# Patient Record
Sex: Female | Born: 1975 | Hispanic: Yes | Marital: Single | Smoking: Never smoker
Health system: Southern US, Community
[De-identification: ages and names within clinical notes are randomized; demographics above are authoritative.]

## PROBLEM LIST (undated history)

## (undated) ENCOUNTER — Inpatient Hospital Stay (HOSPITAL_COMMUNITY): Payer: Self-pay

## (undated) DIAGNOSIS — O139 Gestational [pregnancy-induced] hypertension without significant proteinuria, unspecified trimester: Secondary | ICD-10-CM

## (undated) DIAGNOSIS — E119 Type 2 diabetes mellitus without complications: Secondary | ICD-10-CM

## (undated) HISTORY — DX: Gestational (pregnancy-induced) hypertension without significant proteinuria, unspecified trimester: O13.9

## (undated) HISTORY — DX: Type 2 diabetes mellitus without complications: E11.9

## (undated) HISTORY — PX: CHOLECYSTECTOMY: SHX55

---

## 2008-03-26 LAB — SICKLE CELL SCREEN: Sickle Cell Screen: NORMAL

## 2008-03-26 LAB — HEMOGLOBINOPATHY EVALUATION: Hemoglobin, Elp: NORMAL

## 2008-03-26 LAB — OB RESULTS CONSOLE VARICELLA ZOSTER ANTIBODY, IGG: Varicella: IMMUNE

## 2008-03-27 ENCOUNTER — Ambulatory Visit (HOSPITAL_COMMUNITY): Admission: RE | Admit: 2008-03-27 | Discharge: 2008-03-27 | Payer: Self-pay | Admitting: Family Medicine

## 2008-04-17 ENCOUNTER — Inpatient Hospital Stay (HOSPITAL_COMMUNITY): Admission: AD | Admit: 2008-04-17 | Discharge: 2008-04-17 | Payer: Self-pay | Admitting: Family Medicine

## 2008-06-16 ENCOUNTER — Inpatient Hospital Stay (HOSPITAL_COMMUNITY): Admission: AD | Admit: 2008-06-16 | Discharge: 2008-06-16 | Payer: Self-pay | Admitting: Obstetrics & Gynecology

## 2008-06-27 ENCOUNTER — Ambulatory Visit: Payer: Self-pay | Admitting: Physician Assistant

## 2008-06-27 ENCOUNTER — Inpatient Hospital Stay (HOSPITAL_COMMUNITY): Admission: AD | Admit: 2008-06-27 | Discharge: 2008-06-28 | Payer: Self-pay | Admitting: Obstetrics & Gynecology

## 2008-06-27 ENCOUNTER — Ambulatory Visit: Payer: Self-pay | Admitting: Obstetrics & Gynecology

## 2009-06-03 ENCOUNTER — Emergency Department (HOSPITAL_COMMUNITY): Admission: EM | Admit: 2009-06-03 | Discharge: 2009-06-04 | Payer: Self-pay | Admitting: Emergency Medicine

## 2009-07-25 DIAGNOSIS — E119 Type 2 diabetes mellitus without complications: Secondary | ICD-10-CM | POA: Insufficient documentation

## 2009-11-23 ENCOUNTER — Emergency Department (HOSPITAL_COMMUNITY): Admission: EM | Admit: 2009-11-23 | Discharge: 2009-11-23 | Payer: Self-pay | Admitting: Emergency Medicine

## 2010-04-02 ENCOUNTER — Encounter (INDEPENDENT_AMBULATORY_CARE_PROVIDER_SITE_OTHER): Payer: Self-pay | Admitting: Nurse Practitioner

## 2010-04-09 ENCOUNTER — Ambulatory Visit: Payer: Self-pay | Admitting: Nurse Practitioner

## 2010-04-09 DIAGNOSIS — I10 Essential (primary) hypertension: Secondary | ICD-10-CM

## 2010-04-09 DIAGNOSIS — E669 Obesity, unspecified: Secondary | ICD-10-CM

## 2010-04-27 ENCOUNTER — Ambulatory Visit: Payer: Self-pay | Admitting: Nurse Practitioner

## 2010-05-06 ENCOUNTER — Ambulatory Visit: Payer: Self-pay | Admitting: Nurse Practitioner

## 2010-05-06 DIAGNOSIS — K089 Disorder of teeth and supporting structures, unspecified: Secondary | ICD-10-CM | POA: Insufficient documentation

## 2010-05-06 LAB — CONVERTED CEMR LAB
Bilirubin Urine: NEGATIVE
Blood Glucose, Fingerstick: 109
Blood in Urine, dipstick: NEGATIVE
Nitrite: NEGATIVE
Urobilinogen, UA: 0.2
WBC Urine, dipstick: NEGATIVE

## 2010-05-07 ENCOUNTER — Encounter (INDEPENDENT_AMBULATORY_CARE_PROVIDER_SITE_OTHER): Payer: Self-pay | Admitting: Nurse Practitioner

## 2010-05-07 LAB — CONVERTED CEMR LAB
ALT: 36 units/L — ABNORMAL HIGH (ref 0–35)
Albumin: 4.4 g/dL (ref 3.5–5.2)
Alkaline Phosphatase: 128 units/L — ABNORMAL HIGH (ref 39–117)
BUN: 9 mg/dL (ref 6–23)
CO2: 21 meq/L (ref 19–32)
Chloride: 102 meq/L (ref 96–112)
Cholesterol: 159 mg/dL (ref 0–200)
Creatinine, Ser: 0.53 mg/dL (ref 0.40–1.20)
Glucose, Bld: 100 mg/dL — ABNORMAL HIGH (ref 70–99)
HDL: 50 mg/dL (ref >39)
Potassium: 4.4 meq/L (ref 3.5–5.3)
Total Bilirubin: 0.9 mg/dL (ref 0.3–1.2)
Total CHOL/HDL Ratio: 3.2
Total Protein: 7.4 g/dL (ref 6.0–8.3)
Triglycerides: 192 mg/dL — ABNORMAL HIGH (ref <150)
VLDL: 38 mg/dL (ref 0–40)

## 2010-05-10 ENCOUNTER — Encounter (INDEPENDENT_AMBULATORY_CARE_PROVIDER_SITE_OTHER): Payer: Self-pay | Admitting: Nurse Practitioner

## 2010-05-10 LAB — CONVERTED CEMR LAB
Bilirubin, Direct: 0.1 mg/dL (ref 0.0–0.3)
Indirect Bilirubin: 0.8 mg/dL (ref 0.0–0.9)

## 2010-05-12 ENCOUNTER — Encounter (INDEPENDENT_AMBULATORY_CARE_PROVIDER_SITE_OTHER): Payer: Self-pay | Admitting: Nurse Practitioner

## 2010-05-14 ENCOUNTER — Telehealth (INDEPENDENT_AMBULATORY_CARE_PROVIDER_SITE_OTHER): Payer: Self-pay | Admitting: Nurse Practitioner

## 2010-05-27 ENCOUNTER — Encounter (INDEPENDENT_AMBULATORY_CARE_PROVIDER_SITE_OTHER): Payer: Self-pay | Admitting: Nurse Practitioner

## 2010-06-14 ENCOUNTER — Encounter (INDEPENDENT_AMBULATORY_CARE_PROVIDER_SITE_OTHER): Payer: Self-pay | Admitting: Nurse Practitioner

## 2010-07-06 ENCOUNTER — Encounter (INDEPENDENT_AMBULATORY_CARE_PROVIDER_SITE_OTHER): Payer: Self-pay | Admitting: Nurse Practitioner

## 2010-07-06 ENCOUNTER — Ambulatory Visit: Payer: Self-pay | Admitting: Nurse Practitioner

## 2010-07-06 DIAGNOSIS — B373 Candidiasis of vulva and vagina: Secondary | ICD-10-CM | POA: Insufficient documentation

## 2010-07-06 DIAGNOSIS — B3731 Acute candidiasis of vulva and vagina: Secondary | ICD-10-CM | POA: Insufficient documentation

## 2010-07-06 LAB — CONVERTED CEMR LAB
Blood Glucose, Fingerstick: 118
Glucose, Urine, Semiquant: NEGATIVE
Ketones, urine, test strip: NEGATIVE
Urobilinogen, UA: 0.2
pH: 6

## 2010-07-07 ENCOUNTER — Encounter (INDEPENDENT_AMBULATORY_CARE_PROVIDER_SITE_OTHER): Payer: Self-pay | Admitting: Nurse Practitioner

## 2010-07-25 DIAGNOSIS — E119 Type 2 diabetes mellitus without complications: Secondary | ICD-10-CM

## 2010-07-25 HISTORY — DX: Type 2 diabetes mellitus without complications: E11.9

## 2010-08-17 ENCOUNTER — Ambulatory Visit
Admission: RE | Admit: 2010-08-17 | Discharge: 2010-08-17 | Payer: Self-pay | Source: Home / Self Care | Attending: Nurse Practitioner | Admitting: Nurse Practitioner

## 2010-08-17 ENCOUNTER — Encounter (INDEPENDENT_AMBULATORY_CARE_PROVIDER_SITE_OTHER): Payer: Self-pay | Admitting: Nurse Practitioner

## 2010-08-17 LAB — CONVERTED CEMR LAB
Nitrite: NEGATIVE
pH: 5

## 2010-08-18 ENCOUNTER — Ambulatory Visit
Admission: RE | Admit: 2010-08-18 | Discharge: 2010-08-18 | Payer: Self-pay | Source: Home / Self Care | Attending: Nurse Practitioner | Admitting: Nurse Practitioner

## 2010-08-24 NOTE — Letter (Signed)
Summary: PT INFORMATION SHEET  PT INFORMATION SHEET   Imported By: Arta Bruce 04/13/2010 15:17:54  _____________________________________________________________________  External Attachment:    Type:   Image     Comment:   External Document

## 2010-08-24 NOTE — Letter (Signed)
Summary: DENTAL REFERRAL  DENTAL REFERRAL   Imported By: Arta Bruce 05/07/2010 14:15:28  _____________________________________________________________________  External Attachment:    Type:   Image     Comment:   External Document

## 2010-08-24 NOTE — Assessment & Plan Note (Signed)
Summary: Diabetes   Vital Signs:  Patient profile:   35 year old female Height:      59 inches Weight:      183 pounds BMI:     37.10 Temp:     98.3 degrees F oral Pulse rate:   63 / minute Pulse rhythm:   regular Resp:     18 per minute BP sitting:   120 / 90  (right arm) Cuff size:   regular  Vitals Entered By: Armenia Shannon (May 06, 2010 11:19 AM)  Nutrition Counseling: Patient's BMI is greater than 25 and therefore counseled on weight management options. CC: four week f/u..., Hypertension Management Is Patient Diabetic? Yes Pain Assessment Patient in pain? no      CBG Result 109  Does patient need assistance? Functional Status Self care Ambulation Normal   CC:  four week f/u... and Hypertension Management.  History of Present Illness:  Pt into the office for diabetes. Pt established care on 04/07/2010.  Community Liason into the office for spanish interpreter  Diabetes Management History:      The patient is a 34 years old female who comes in for evaluation of DM Type 2.  She is (or has been) enrolled in the "Diabetic Education Program".  She states understanding of dietary principles and is following her diet appropriately.  No sensory loss is reported.  Self foot exams are not being performed.  She is not checking home blood sugars.  She says that she is exercising.  Type of exercise includes: walking.  Duration of exercise is estimated to be 60 min.  She is doing this 5 times per week.    Hypertension History:      She denies headache, chest pain, and palpitations.  She notes no problems with any antihypertensive medication side effects.  Pt is taking lisinopril daily as ordered.        Positive major cardiovascular risk factors include diabetes and hypertension.  Negative major cardiovascular risk factors include female age less than 73 years old and non-tobacco-user status.     Habits & Providers  Alcohol-Tobacco-Diet     Alcohol drinks/day: 0  Tobacco Status: never  Exercise-Depression-Behavior     Does Patient Exercise: yes     Exercise Counseling: to improve exercise regimen     Type of exercise: walking     Exercise (avg: min/session): 60 min.     Times/week: 5     Depression Counseling: not indicated; screening negative for depression     Drug Use: never  Allergies: No Known Drug Allergies  Review of Systems General:  bil molar pain. CV:  Denies chest pain or discomfort. Resp:  Denies cough. GI:  Denies abdominal pain, nausea, and vomiting.  Diabetic Foot Exam Foot Inspection Is there a history of a foot ulcer?              No Is there a foot ulcer now?              No Can the patient see the bottom of their feet?          No Are the shoes appropriate in style and fit?          Yes Is there swelling or an abnormal foot shape?          No Are the toenails long?                No Are the toenails thick?  No Are the toenails ingrown?              No Is there heavy callous build-up?              No Is there pain in the calf muscle (Intermittent claudication) when walking?    NoIs there a claw toe deformity?              No Is there elevated skin temperature?            No Is there limited ankle dorsiflexion?            No Is there foot or ankle muscle weakness?            No  Diabetic Foot Care Education Pulse Check          Right Foot          Left Foot Dorsalis Pedis:        normal            normal   Physical Exam  General:  alert.   Head:  normocephalic.   Lungs:  normal breath sounds.   Heart:  normal rate and regular rhythm.   Abdomen:  normal bowel sounds.   Neurologic:  alert & oriented X3.    Diabetes Management Exam:       Nails:          Left foot: normal          Right foot: normal   Impression & Recommendations:  Problem # 1:  DIABETES MELLITUS, TYPE II (ICD-250.00) will call [place] pharmacy to see why pt was not given a glucometer - she need to check BS daily before  breakfast Her updated medication list for this problem includes:    Metformin Hcl 500 Mg Tabs (Metformin hcl) ..... One tablet by mouth two times a day for diabetes    Lisinopril 5 Mg Tabs (Lisinopril) ..... One tablet by mouth daily for blood pressure  Orders: T-Lipid Profile ([address]109-60454) T-Comprehensive Metabolic Panel (09811-91478) Capillary Blood Glucose/CBG (29562)  Problem # 2:  HYPERTENSION, BENIGN ESSENTIAL (ICD-401.1) BP is stable DASH diet Her updated medication list for this problem includes:    Lisinopril 5 Mg Tabs (Lisinopril) ..... One tablet by mouth daily for blood pressure  Problem # 3:  OBESITY (ICD-278.00) advised pt to add exercise to her diet  Problem # 4:  DENTAL PAIN (ICD-525.9) will refer to dental clinic handout given about free dental clinic Orders: Dental Referral (Dentist)  Complete Medication List: 1)  Metformin Hcl 500 Mg Tabs (Metformin hcl) .... One tablet by mouth two times a day for diabetes 2)  Lisinopril 5 Mg Tabs (Lisinopril) .... One tablet by mouth daily for blood pressure 3)  Blood Glucose Meter Kit (Blood glucose monitoring suppl) .... Use to check blood sugar once daily before breakfast 4)  Lancets Misc (Lancets) .... Use to check blood sugar daily before breakfast 5)  Blood Glucose Test Strp (Glucose blood) .... Use to check blood sugar before breakfast  Other Orders: Flu Vaccine 46yrs + (13086) Admin 1st Vaccine (57846) Admin 1st Vaccine [facility]) [phone])  Diabetes Management Assessment/Plan:      Her blood pressure goal is < 130/80.    Hypertension Assessment/Plan:      The patient's hypertensive risk group is category C: Target organ damage and/or diabetes.  Today's blood pressure is 120/90.  Her blood pressure goal is < 130/80.  Patient Instructions: 1)  You have received the  flu vaccine today. 2)  You will be referred to the dental clinic. 3)  You can also go to the free dental clinic. 4)  For pain in teeth you can take  ibuprofen as needed (with food) 5)  Return to office if any swelling of gums, tenderness, fever. 6)  Follow up in 2 months with n.martin,fnp for diabetes. 7)  will need cbg, hgba1c, u/a. Prescriptions: LISINOPRIL 5 MG TABS (LISINOPRIL) One tablet by mouth daily for blood pressure  #30 x 5   Entered and Authorized by:   Lehman Prom FNP   Signed by:   Lehman Prom FNP on 05/06/2010   Method used:   Faxed to ...       [facility] (retail)       [address] [place], [place]  [address]       Ph: [phone]       Fax: [phone]   RxID:   [id number] METFORMIN HCL 500 MG TABS (METFORMIN HCL) One tablet by mouth two times a day for diabetes  #60 x 5   Entered and Authorized by:   Lehman Prom FNP   Signed by:   Lehman Prom FNP on 05/06/2010   Method used:   Faxed to ...       [facility] (retail)       [address] [place], [place]  [address]       Ph: [phone]       Fax: [phone]   RxID:   [id number]          Diabetic Foot Exam Last Podiatry Exam Date: 05/06/2010  Foot Inspection Is there a history of a foot ulcer?              No Is there a foot ulcer now?              No Is there swelling or an abnormal foot shape?          No Are the toenails long?                No Are the toenails thick?                No Are the toenails ingrown?              No Is there heavy callous build-up?              No Is there a claw toe deformity?                          No Is there elevated skin temperature?            No Is there limited ankle dorsiflexion?            No Is there foot or ankle muscle weakness?            No Do you have pain in calf while walking?           No      Pulse Check          Right Foot          Left Foot Dorsalis Pedis:        normal            normal    Prevention & Chronic Care Immunizations  Influenza vaccine: Fluvax 3+   (05/06/2010)    Tetanus booster: Not documented    Pneumococcal vaccine: Pneumovax  (04/09/2010)  Other Screening   Pap smear: Not documented   Smoking status: never  (05/06/2010)  Diabetes Mellitus   HgbA1C: 9.4  (04/09/2010)    Eye exam: Not documented    Foot exam: yes  (04/09/2010)   High risk foot: Not documented   Foot care education: Done  (04/09/2010)    Urine microalbumin/creatinine ratio: Not documented  Lipids   Total Cholesterol: Not documented   LDL: Not documented   LDL Direct: Not documented   HDL: Not documented   Triglycerides: Not documented  Hypertension   Last Blood Pressure: 120 / 90  (05/06/2010)   Serum creatinine: Not documented   Serum potassium Not documented CMP ordered   Self-Management Support :    Diabetes self-management support: Not documented    Hypertension self-management support: Not documented   Nursing Instructions: Give Flu vaccine today    Laboratory Results   Urine Tests    Routine Urinalysis   Color: yellow Glucose: negative   (Normal Range: Negative) Bilirubin: negative   (Normal Range: Negative) Ketone: negative   (Normal Range: Negative) Blood: negative   (Normal Range: Negative) Protein: negative   (Normal Range: Negative) Urobilinogen: 0.2   (Normal Range: 0-1) Nitrite: negative   (Normal Range: Negative) Leukocyte Esterace: negative   (Normal Range: Negative)     Blood Tests     CBG Random:: 109mg /dL       Influenza Vaccine    Vaccine Type: Fluvax 3+    Site: right deltoid    Mfr: GlaxoSmithKline    Dose: 0.5 ml    Route: IM    Given by: Armenia Shannon    Exp. Date: 12/2010    Lot #: [id number]    VIS given: 02/16/10 version given May 06, 2010.  Flu Vaccine Consent Questions    Do you have a history of severe allergic reactions to this vaccine? no    Any prior history of allergic reactions to egg and/or gelatin? no    Do you have a sensitivity to the preservative Thimersol?  no    Do you have a past history of Guillan-Barre Syndrome? no    Do you currently have an acute febrile illness? no    Have you ever had a severe reaction to latex? no    Vaccine information given and explained to patient? yes    Are you currently pregnant? no

## 2010-08-24 NOTE — Progress Notes (Signed)
Summary: needs lab visit  Phone Note Call from Patient   Summary of Call: [facility] lab said there was not enough specimen for hep b surface antibody Initial call taken by: Armenia Shannon,  May 14, 2010 9:12 AM  Follow-up for Phone Call        forward to Jesse Fall, fnp Follow-up by: Levon Hedger,  May 14, 2010 9:34 AM  Additional Follow-up for Phone Call Additional follow up Details #1::        return for redraw. notify pt that her liver enzymes were elevated during a recent office visit.  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 notified and lab appt. scheduled. Follow-up by: Gaylyn Cheers RN,  May 19, 2010 12:02 PM

## 2010-08-24 NOTE — Letter (Signed)
Summary: NUTRITION /SUSIE  NUTRITION /SUSIE   Imported By: Arta Bruce 05/13/2010 11:10:43  _____________________________________________________________________  External Attachment:    Type:   Image     Comment:   External Document

## 2010-08-24 NOTE — Assessment & Plan Note (Signed)
Summary: NEW - Establish Care   Vital Signs:  Patient profile:   35 year old female LMP:     03/25/2010 Height:      59 inches Weight:      183.8 pounds BMI:     37.26 Temp:     97.0 degrees F oral Pulse rate:   64 / minute Pulse rhythm:   regular Resp:     16 per minute BP sitting:   130 / 90  (left arm) Cuff size:   regular  Vitals Entered By: Levon Hedger (April 09, 2010 9:20 AM)  Nutrition Counseling: Patient's BMI is greater than 25 and therefore counseled on weight management options. CC: new pt ...newly diagnosed diabetic was seen at [facility], Hypertension Management Is Patient Diabetic? Yes Pain Assessment Patient in pain? no       Does patient need assistance? Functional Status Self care Ambulation Normal LMP (date): 03/25/2010     Enter LMP: 03/25/2010   CC:  new pt ...newly diagnosed diabetic was seen at [facility] and Hypertension Management.  History of Present Illness:  Pt into the office to establish care. No previous PCP Multiple previous ER visits, one earlier this year she was dx with diabetes.  Obesity - lost 32 pounds in 2011 since dx with diabetes.  Spanish speaking - language line in use.Spanish Speaking  Diabetes Management History:      The patient is a 35 years old female who comes in for evaluation of DM Type 2.  She has not been enrolled in the "Diabetic Education Program".  She states lack of understanding of dietary principles and is not following her diet appropriately.  No sensory loss is reported.  Self foot exams are not being performed.  She is not checking home blood sugars.  She says that she is exercising.  Type of exercise includes: walking.  Duration of exercise is estimated to be 60 min.  She is doing this 5 times per week.        Hypoglycemic symptoms are not occurring.  No hyperglycemic symptoms are reported.    Hypertension History:      She denies headache, chest pain, and palpitations.  She notes no problems  with any antihypertensive medication side effects.        Positive major cardiovascular risk factors include diabetes and hypertension.  Negative major cardiovascular risk factors include female age less than 54 years old and non-tobacco-user status.     Habits & Providers  Alcohol-Tobacco-Diet     Alcohol drinks/day: 0     Tobacco Status: never  Exercise-Depression-Behavior     Does Patient Exercise: yes     Exercise Counseling: to improve exercise regimen     Type of exercise: walking     Exercise (avg: min/session): 60 min.     Times/week: 5     Have you felt down or hopeless? no     Have you felt little pleasure in things? no     Depression Counseling: not indicated; screening negative for depression     Drug Use: never  Medications Prior to Update: 1)  None  Allergies (verified): No Known Drug Allergies  Past History:  Past Surgical History: Cholecystectomy 2007  Diabetic Foot Exam Foot Inspection Is there a history of a foot ulcer?              No Is there a foot ulcer now?              No  Can the patient see the bottom of their feet?          No Are the shoes appropriate in style and fit?          No Is there swelling or an abnormal foot shape?          No Are the toenails long?                No Are the toenails thick?                No Is there heavy callous build-up?              No Is there pain in the calf muscle (Intermittent claudication) when walking?    NoIs there a claw toe deformity?              No Is there elevated skin temperature?            No Is there limited ankle dorsiflexion?            No Is there foot or ankle muscle weakness?            No  Diabetic Foot Care Education Patient educated on appropriate care of diabetic feet.  Pulse Check          Right Foot          Left Foot Dorsalis Pedis:        normal            normal    10-g (5.07) Semmes-Weinstein Monofilament Test Performed by: Lehman Prom FNP          Right Foot           Left Foot Visual Inspection               Test Control      normal         normal Site 1         normal         normal Site 2         normal         normal Site 3         normal         normal Site 4         normal         normal Site 5         normal         normal Site 6         normal         normal Site 7         normal         normal Site 8         normal         normal Site 9         normal         normal Site 10         normal         normal  Impression      normal         normal  Legend:  Site 1 = Plantar aspect of first toe (center of pad) Site 2 = Plantar aspect of third toe (center of pad) Site 3 = Plantar aspect of fifth toe (center of pad) Site 4 = Plantar aspect of first metatarsal head Site 5 = Plantar aspect of third metatarsal  head Site 6 = Plantar aspect of fifth metatarsal head Site 7 = Plantar aspect of medial midfoot Site 8 = Plantar aspect of lateral midfoot Site 9 = Plantar aspect of heel Site 10 = dorsal aspect of foot between the base of the first and second toes   Result is Abnormal if patient was unable to perceive the monofilament at site indicated.   Family History: Mother - diabetes father - healthy  Social History: Spanish speaking 4 children - natural deliveries Tobacco - none ETOH - none Drug - noneSmoking Status:  never Drug Use:  never Does Patient Exercise:  yes  Review of Systems General:  Denies loss of appetite. CV:  Denies chest pain or discomfort. Resp:  Denies cough. GI:  Denies abdominal pain, nausea, and vomiting. Endo:  Denies excessive thirst and excessive urination.  Physical Exam  General:  alert.  obese Head:  normocephalic.   Lungs:  normal breath sounds.   Heart:  normal rate and regular rhythm.   Abdomen:  normal bowel sounds.   Msk:  normal ROM.   Neurologic:  alert & oriented X3.   Skin:  Tattoos on bilateral wrists  Diabetes Management Exam:    Foot Exam (with socks and/or shoes not present):        Sensory-Monofilament:          Left foot: normal          Right foot: normal       Inspection:          Left foot: normal          Right foot: normal       Nails:          Left foot: normal          Right foot: normal   Impression & Recommendations:  Problem # 1:  DIABETES MELLITUS, TYPE II (ICD-250.00) uncontrolled. new dx pt will needed education Orders: UA Dipstick w/o Micro (manual) (78295) Capillary Blood Glucose/CBG (82948) Hemoglobin A1C (83036) T-Urine Microalbumin w/creat. ratio [phone]) Diabetic Clinic Referral (Diabetic)  Her updated medication list for this problem includes:    Metformin Hcl 500 Mg Tabs (Metformin hcl) ..... One tablet by mouth two times a day for diabetes    Lisinopril 5 Mg Tabs (Lisinopril) ..... One tablet by mouth daily for blood pressure  Problem # 2:  HYPERTENSION, BENIGN ESSENTIAL (ICD-401.1) slightly elevated today. will start ACE Her updated medication list for this problem includes:    Lisinopril 5 Mg Tabs (Lisinopril) ..... One tablet by mouth daily for blood pressure  Problem # 3:  OBESITY (ICD-278.00) pt is still trying to lose weight. continue efforts  Complete Medication List: 1)  Metformin Hcl 500 Mg Tabs (Metformin hcl) .... One tablet by mouth two times a day for diabetes 2)  Lisinopril 5 Mg Tabs (Lisinopril) .... One tablet by mouth daily for blood pressure 3)  Blood Glucose Meter Kit (Blood glucose monitoring suppl) .... Use to check blood sugar once daily before breakfast 4)  Lancets Misc (Lancets) .... Use to check blood sugar daily before breakfast 5)  Blood Glucose Test Strp (Glucose blood) .... Use to check blood sugar before breakfast  Other Orders: Pneumococcal Vaccine (29528) Admin 1st Vaccine (41324) Admin 1st Vaccine [facility]) [phone])  Diabetes Management Assessment/Plan:      Her blood pressure goal is < 130/80.    Hypertension Assessment/Plan:      The patient's hypertensive risk group is  category C: Target organ damage  and/or diabetes.  Today's blood pressure is 130/90.  Her blood pressure goal is < 130/80.  Patient Instructions: 1)  Schedule an appointment with Drucilla Schmidt - diabetes educator for diabetes teaching.  **Will need Arna Medici to assist with interpreting** 2)  You have recieved the pneumovax today.   3)  You can get your refills from the [facility].  Bring your meter with you to the appointment with Susie Piper. 4)  Diabetes - your Hgba1c = 9.4.  The goal is less than 7. 5)  Start metformin 500mg  by mouth daily with breakfast and dinner. 6)  Bllood pressure  - slightly high.  will start low dose medication - lisinopril 5mg  by mouth daily.   7)  Schedule follow up appointment with n.martin in 4 weeks for diabetes. will need cbg, lipids, cmp, flu vaccine 8)  Do not eat after midnight before this visit. Prescriptions: BLOOD GLUCOSE TEST  STRP (GLUCOSE BLOOD) Use to check blood sugar before breakfast  #100 x 5   Entered and Authorized by:   Lehman Prom FNP   Signed by:   Lehman Prom FNP on 04/09/2010   Method used:   Print then Give to Patient   RxID:   [id number] LANCETS  MISC (LANCETS) Use to check blood sugar daily before breakfast  #100 x 5   Entered and Authorized by:   Lehman Prom FNP   Signed by:   Lehman Prom FNP on 04/09/2010   Method used:   Print then Give to Patient   RxID:   [id number] BLOOD GLUCOSE METER  KIT (BLOOD GLUCOSE MONITORING SUPPL) use to check blood sugar once daily before breakfast  #1 meter x 0   Entered and Authorized by:   Lehman Prom FNP   Signed by:   Lehman Prom FNP on 04/09/2010   Method used:   Print then Give to Patient   RxID:   [id number] LISINOPRIL 5 MG TABS (LISINOPRIL) One tablet by mouth daily for blood pressure  #30 x 3   Entered and Authorized by:   Lehman Prom FNP   Signed by:   Lehman Prom FNP on 04/09/2010   Method used:   Print then Give to Patient   RxID:    [id number] METFORMIN HCL 500 MG TABS (METFORMIN HCL) One tablet by mouth two times a day for diabetes  #60 x 3   Entered and Authorized by:   Lehman Prom FNP   Signed by:   Lehman Prom FNP on 04/09/2010   Method used:   Print then Give to Patient   RxID:   [id number]   Laboratory Results   Urine Tests  Date/Time Received: April 09, 2010 10:12 AM   Routine Urinalysis   Color: lt. yellow Appearance: Clear Glucose: negative   (Normal Range: Negative) Bilirubin: negative   (Normal Range: Negative) Ketone: negative   (Normal Range: Negative) Spec. Gravity: <1.005   (Normal Range: 1.003-1.035) Blood: negative   (Normal Range: Negative) pH: 5.0   (Normal Range: 5.0-8.0) Protein: negative   (Normal Range: Negative) Urobilinogen: 0.2   (Normal Range: 0-1) Nitrite: negative   (Normal Range: Negative) Leukocyte Esterace: negative   (Normal Range: Negative)     Blood Tests   Date/Time Received: April 09, 2010 9:43 AM   Glucose (random): 129 mg/dL   (Normal Range: 51-884) HGBA1C: 9.4%   (Normal Range: Non-Diabetic - 3-6%   Control Diabetic - 6-8%)     Laboratory Results   Urine Tests    Routine Urinalysis  Color: lt. yellow Appearance: Clear Glucose: negative   (Normal Range: Negative) Bilirubin: negative   (Normal Range: Negative) Ketone: negative   (Normal Range: Negative) Spec. Gravity: <1.005   (Normal Range: 1.003-1.035) Blood: negative   (Normal Range: Negative) pH: 5.0   (Normal Range: 5.0-8.0) Protein: negative   (Normal Range: Negative) Urobilinogen: 0.2   (Normal Range: 0-1) Nitrite: negative   (Normal Range: Negative) Leukocyte Esterace: negative   (Normal Range: Negative)     Blood Tests     Glucose (random): 129 mg/dL   (Normal Range: 13-086) HGBA1C: 9.4%   (Normal Range: Non-Diabetic - 3-6%   Control Diabetic - 6-8%)       Pneumovax Vaccine    Vaccine Type: Pneumovax    Site: right deltoid    Mfr: [employer]ck     Dose: 0.5 ml    Route: IM    Given by: Levon Hedger    Exp. Date: 08/15/2011    Lot #: [id number]    VIS given: 06/29/09 version given April 09, 2010.    ndc  [id number]

## 2010-08-26 NOTE — Assessment & Plan Note (Signed)
Summary: Diabetes   Vital Signs:  Patient profile:   35 year old female LMP:     06/20/2010 Weight:      182.5 pounds BMI:     36.99 Temp:     97.0 degrees F oral Pulse rate:   60 / minute Pulse rhythm:   regular Resp:     16 per minute BP sitting:   126 / 90  (left arm) Cuff size:   regular  Vitals Entered By: Levon Hedger (July 06, 2010 9:37 AM)  Nutrition Counseling: Patient's BMI is greater than 25 and therefore counseled on weight management options. CC: follow-up visit DM.Marland KitchenMarland Kitchenpt has been out of medication for 1 month..., Hypertension Management Is Patient Diabetic? Yes Pain Assessment Patient in pain? no      CBG Result 118 CBG Device ID [id number]  Does patient need assistance? Functional Status Self care Ambulation Normal LMP (date): 06/20/2010     Enter LMP: 06/20/2010   CC:  follow-up visit DM.Marland KitchenMarland Kitchenpt has been out of medication for 1 month... and Hypertension Management.  History of Present Illness:  Pt into the office for diabetes. She has been without the medications for 1 month. Rx written in 04/2010 with 5 refills. Pt does not understand refill protocal.  Obesity - down 1 pound since last visit  Family planning - pt is using condoms at this time but she would like another method. previously using depoprovera but she stopped taking it due to weight gain.  Diabetes Management History:      The patient is a 35 years old female who comes in for evaluation of DM Type 2.  She is (or has been) enrolled in the "Diabetic Education Program".  She states understanding of dietary principles and is following her diet appropriately.  No sensory loss is reported.  Self foot exams are not being performed.  She is not checking home blood sugars.  She says that she is exercising.  Type of exercise includes: walking.  Duration of exercise is estimated to be 60 min.  She is doing this 5 times per week.        Hypoglycemic symptoms are not occurring.  No hyperglycemic symptoms  are reported.  Other comments include: pt does not have a glucometer. Pt has been without her meds for the past month.        The following changes have been made to her treatment plan since last visit: medication changes.  Treatment plan changes were initiated by patient.    Hypertension History:      She denies headache, chest pain, and palpitations.  She notes no problems with any antihypertensive medication side effects.  pt has not taken her meds for the past month due to not understanding the refill process.        Positive major cardiovascular risk factors include diabetes and hypertension.  Negative major cardiovascular risk factors include female age less than 3 years old and non-tobacco-user status.     Habits & Providers  Alcohol-Tobacco-Diet     Alcohol drinks/day: 0     Tobacco Status: never  Exercise-Depression-Behavior     Does Patient Exercise: yes     Exercise Counseling: to improve exercise regimen     Type of exercise: walking     Exercise (avg: min/session): 60 min.     Times/week: 5     Depression Counseling: not indicated; screening negative for depression     Drug Use: never  Allergies (verified): No Known Drug Allergies  Review  of Systems General:  Complains of fatigue. Eyes:  Complains of blurring; for the past 3 days. CV:  Denies chest pain or discomfort. Resp:  Denies cough. GI:  Denies abdominal pain, diarrhea, nausea, and vomiting. GU:  Complains of dysuria; denies hematuria; +itching. Endo:  Denies excessive thirst and excessive urination.  Physical Exam  General:  alert.   Head:  normocephalic.   Lungs:  normal breath sounds.   Heart:  normal rate and regular rhythm.   Abdomen:  normal bowel sounds.   Msk:  up to the exam table Neurologic:  alert & oriented X3.   Skin:  color normal.   Psych:  Oriented X3.    Diabetes Management Exam:    Foot Exam (with socks and/or shoes not present):       Sensory-Monofilament:          Left foot:  normal          Right foot: normal   Impression & Recommendations:  Problem # 1:  DIABETES MELLITUS, TYPE II (ICD-250.00) pt was instructed to get refils as ordered she also needs to get a glucometer to check blood sugar daily Her updated medication list for this problem includes:    Metformin Hcl 500 Mg Tabs (Metformin hcl) ..... One tablet by mouth two times a day for diabetes    Lisinopril 5 Mg Tabs (Lisinopril) ..... One tablet by mouth daily for blood pressure  Orders: Capillary Blood Glucose/CBG (82948) T- Hemoglobin A1C (51884-16606)  Problem # 2:  HYPERTENSION, BENIGN ESSENTIAL (ICD-401.1)  Her updated medication list for this problem includes:    Lisinopril 5 Mg Tabs (Lisinopril) ..... One tablet by mouth daily for blood pressure  Problem # 3:  OBESITY (ICD-278.00) down 1 pounds since last visit pt to keep up efforts to lose weight  Problem # 4:  CANDIDIASIS OF VULVA AND VAGINA (ICD-112.1)  Her updated medication list for this problem includes:    Fluconazole 150 Mg Tabs (Fluconazole) ..... One tablet by mouth x 1 dose  Orders: KOH/ WET Mount [phone]) UA Dipstick w/o Micro (manual) (10932)  Complete Medication List: 1)  Metformin Hcl 500 Mg Tabs (Metformin hcl) .... One tablet by mouth two times a day for diabetes 2)  Lisinopril 5 Mg Tabs (Lisinopril) .... One tablet by mouth daily for blood pressure 3)  Blood Glucose Meter Kit (Blood glucose monitoring suppl) .... Use to check blood sugar once daily before breakfast 4)  Lancets Misc (Lancets) .... Use to check blood sugar daily before breakfast 5)  Blood Glucose Test Strp (Glucose blood) .... Use to check blood sugar before breakfast 6)  Fluconazole 150 Mg Tabs (Fluconazole) .... One tablet by mouth x 1 dose  Diabetes Management Assessment/Plan:      Her blood pressure goal is < 130/80.    Hypertension Assessment/Plan:      The patient's hypertensive risk group is category C: Target organ damage and/or  diabetes.  Her calculated 10 year risk of coronary heart disease is 2 %.  Today's blood pressure is 126/90.  Her blood pressure goal is < 130/80.  Patient Instructions: 1)  Schedule a visit in the next 2 weeks for family planning to discuss birth control. 2)  Use condoms until your next visit. 3)  You have a yeast infection probably due to blood sugar.  4)  Take fluconazole 150mg  by mouth x 1 dose 5)  Schedule a complete physical exam in 3 months. 6)  You will need PAP, mammogram, u/a,  tdap. 7)  will need cbc, tsh, cmp. Prescriptions: FLUCONAZOLE 150 MG TABS (FLUCONAZOLE) One tablet by mouth x 1 dose  #1 x 0   Entered and Authorized by:   Lehman Prom FNP   Signed by:   Lehman Prom FNP on 07/06/2010   Method used:   Print then Give to Patient   RxID:   [id number]    Orders Added: 1)  Capillary Blood Glucose/CBG [82948] 2)  Est. Patient Level III [53664] 3)  T- Hemoglobin A1C [83036-23375] 4)  KOH/ WET Mount [87210] 5)  UA Dipstick w/o Micro (manual) [81002]    Last LDL:                                                 71 (05/06/2010 9:46:00 PM)        Diabetic Foot Exam Last Podiatry Exam Date: 05/06/2010    10-g (5.07) Semmes-Weinstein Monofilament Test Performed by: Levon Hedger          Right Foot          Left Foot Visual Inspection               Test Control      normal         normal Site 1         normal         normal Site 2         normal         normal Site 3         normal         normal Site 4         normal         normal Site 5         normal         normal Site 6         normal         normal Site 7         normal         normal Site 8         normal         normal Site 9         normal         normal Site 10         normal         normal  Impression      normal         normal   Laboratory Results   Urine Tests  Date/Time Received: July 06, 2010 9:54 AM   Routine Urinalysis   Color: lt. yellow Appearance:  Clear Glucose: negative   (Normal Range: Negative) Bilirubin: negative   (Normal Range: Negative) Ketone: negative   (Normal Range: Negative) Spec. Gravity: 1.015   (Normal Range: 1.003-1.035) Blood: negative   (Normal Range: Negative) pH: 6.0   (Normal Range: 5.0-8.0) Protein: negative   (Normal Range: Negative) Urobilinogen: 0.2   (Normal Range: 0-1) Nitrite: negative   (Normal Range: Negative) Leukocyte Esterace: negative   (Normal Range: Negative)     Blood Tests     CBG Random:: 118  Date/Time Received: July 06, 2010 11:02 AM   Wet Mount/KOH Source: vaginal WBC/hpf: 1-5 Bacteria/hpf: rare Clue cells/hpf: none Yeast/hpf: moderate Trichomonas/hpf: none   Laboratory Results  Urine Tests    Routine Urinalysis   Color: lt. yellow Appearance: Clear Glucose: negative   (Normal Range: Negative) Bilirubin: negative   (Normal Range: Negative) Ketone: negative   (Normal Range: Negative) Spec. Gravity: 1.015   (Normal Range: 1.003-1.035) Blood: negative   (Normal Range: Negative) pH: 6.0   (Normal Range: 5.0-8.0) Protein: negative   (Normal Range: Negative) Urobilinogen: 0.2   (Normal Range: 0-1) Nitrite: negative   (Normal Range: Negative) Leukocyte Esterace: negative   (Normal Range: Negative)     Blood Tests     CBG Random:: 118mg /dL    DIRECTV KOH: Negative

## 2010-08-26 NOTE — Letter (Signed)
Summary: *HSN Results Follow up  [facility]  [address] [place], [place] [address]   Phone: [phone]  Fax: [phone]      07/07/2010   Rhonda Waller [address] [place], [place]  [address]   Dear  Rhonda Waller,                            ____S.Drinkard,FNP   ____D. Gore,FNP       ____B. McPherson,MD   ____V. Rankins,MD    ____E. Mulberry,MD    __X__N. Daphine Deutscher, FNP  ____D. Reche Dixon, MD    ____K. Philipp Deputy, MD    ____Other     This letter is to inform you that your recent test(s):  _______Pap Smear    __X_____Lab Test     _______X-ray    ___X____ is within acceptable limits  _______ requires a medication change  _______ requires a follow-up lab visit  _______ requires a follow-up visit with your provider   Comments: Your Hgba1c = 6.2 Your blood sugar is doing great. Keep up the good work. Continue your current medications along with diet and exercise.       _________________________________________________________ If you have any questions, please contact our office [phone].                    Sincerely,    Lehman Prom FNP [facility]

## 2010-08-26 NOTE — Letter (Signed)
Summary: IT consultant Handout:  - Administrator

## 2010-08-26 NOTE — Assessment & Plan Note (Signed)
Summary: DEPO INJECTION PER FNP MARTIN  / NS  Nurse Visit   Allergies: No Known Drug Allergies  Medication Administration  Injection # 1:    Medication: Depo-Provera 150mg     Diagnosis: FAMILY PLANNING (ICD-V25.09)    Route: IM    Site: L deltoid    Exp Date: 06/23/2013    Lot #: [id number]    Mfr: Francisca December    Comments: NDC#:[id number]    Patient tolerated injection without complications    Given by: Hale Drone CMA (August 18, 2010 2:48 PM)  Orders Added: 1)  Est. Patient Nurse visit [09003] 2)  Admin of Therapeutic Inj  intramuscular or subcutaneous [96372] 3)  Depo-Provera 150mg  [J1055]

## 2010-08-26 NOTE — Assessment & Plan Note (Signed)
Summary: Family Planning   Vital Signs:  Patient profile:   35 year old female Menstrual status:  regular LMP:     07/20/2010 Weight:      178.9 pounds BMI:     36.26 Temp:     97.4 degrees F oral Pulse rate:   64 / minute Pulse rhythm:   regular Resp:     16 per minute BP sitting:   120 / 90  (left arm) Cuff size:   regular  Vitals Entered By: Levon Hedger (August 17, 2010 10:34 AM)  Nutrition Counseling: Patient's BMI is greater than 25 and therefore counseled on weight management options. CC: discuss birth control Is Patient Diabetic? Yes Pain Assessment Patient in pain? no      CBG Result 105 CBG Device ID A  Does patient need assistance? Functional Status Self care Ambulation Normal LMP (date): 07/20/2010 LMP - Character: normal     Menstrual Status regular Enter LMP: 07/20/2010   CC:  discuss birth control.  History of Present Illness:  Pt into the office for family planning. Pt currently has 4 children Pt does not want to have any more EVER but only wants to have short term not permanent control. Pt is not married. Pt has previously been on DepoProvera with the last over 1 year ago. She only stopped getting the injection because she did not keep her f/u appt at [facility]. +weight gain from DepoProvera with some loss of extra weight after she stopped getting the injections  Obsity - down 4 pounds since last visit  Last PAP Smear done 2 years ago.  Habits & Providers  Alcohol-Tobacco-Diet     Alcohol drinks/day: 0     Tobacco Status: never  Exercise-Depression-Behavior     Does Patient Exercise: yes     Exercise Counseling: to improve exercise regimen     Type of exercise: walking     Exercise (avg: min/session): 60 min.     Times/week: 5     Depression Counseling: not indicated; screening negative for depression     Drug Use: never  Allergies (verified): No Known Drug Allergies  Review of Systems General:  Denies fever. CV:  Denies  chest pain or discomfort. Resp:  Denies cough. GI:  Denies abdominal pain, nausea, and vomiting.  Physical Exam  General:  alert.   Head:  normocephalic.   Lungs:  normal breath sounds.   Heart:  normal rate and regular rhythm.   Abdomen:  normal bowel sounds.   Msk:  up to the exam table Neurologic:  alert & oriented X3.   Skin:  color normal.   Psych:  Oriented X3.    Diabetes Management Exam:    Foot Exam (with socks and/or shoes not present):       Sensory-Monofilament:          Left foot: normal          Right foot: normal   Impression & Recommendations:  Problem # 1:  FAMILY PLANNING (ICD-V25.09) will check labs today pt to return tomorrow for depoprovera Orders: Urine Pregnancy Test  (16109) T-Pregnancy (Serum), Qual.  (60454-[address]11)  Problem # 2:  DIABETES MELLITUS, TYPE II (ICD-250.00) conitinue current meds Her updated medication list for this problem includes:    Metformin Hcl 500 Mg Tabs (Metformin hcl) ..... One tablet by mouth two times a day for diabetes    Lisinopril 5 Mg Tabs (Lisinopril) ..... One tablet by mouth daily for blood pressure  Orders: Capillary Blood Glucose/CBG (  16109) UA Dipstick w/o Micro (manual) (60454)  Problem # 3:  OBESITY (ICD-278.00) down 4 pounds since last visit advised pt to keep up the good work  Complete Medication List: 1)  Metformin Hcl 500 Mg Tabs (Metformin hcl) .... One tablet by mouth two times a day for diabetes 2)  Lisinopril 5 Mg Tabs (Lisinopril) .... One tablet by mouth daily for blood pressure 3)  Blood Glucose Meter Kit (Blood glucose monitoring suppl) .... Use to check blood sugar once daily before breakfast 4)  Lancets Misc (Lancets) .... Use to check blood sugar daily before breakfast 5)  Blood Glucose Test Strp (Glucose blood) .... Use to check blood sugar before breakfast 6)  Depo-provera 150 Mg/ml Susp (Medroxyprogesterone acetate) .... One injection im every 12 weeks for family planning  Patient  Instructions: 1)  Your labs will be checked today. 2)  Return tomorrow for DepoProvera injection - go get [facility] from [facility] in the morning. 3)  Remember that since you are getting the injection BEFORE your period then you may have some breakthrough bleeding or spotting over the next 1-2 months. 4)  Schedule an appointment for a complete physical exam 5)  You will need PAP, PHQ-9 Prescriptions: DEPO-PROVERA 150 MG/ML SUSP (MEDROXYPROGESTERONE ACETATE) One injection IM every 12 weeks for family planning  #1 x 1   Entered and Authorized by:   Lehman Prom FNP   Signed by:   Lehman Prom FNP on 08/17/2010   Method used:   Faxed to ...       [facility] (retail)       [address] [place], [place]  [address]       Ph: [phone]       Fax: [phone]   RxID:   [id number]    Orders Added: 1)  Capillary Blood Glucose/CBG [82948] 2)  UA Dipstick w/o Micro (manual) [81002] 3)  Urine Pregnancy Test  [81025] 4)  Est. Patient Level III [27253] 5)  T-Pregnancy (Serum), Qual.  [phone]    Laboratory Results   Urine Tests  Date/Time Received: August 17, 2010 10:55 AM   Routine Urinalysis   Glucose: negative   (Normal Range: Negative) Bilirubin: negative   (Normal Range: Negative) Ketone: negative   (Normal Range: Negative) Spec. Gravity: <1.005   (Normal Range: 1.003-1.035) Blood: negative   (Normal Range: Negative) pH: 5.0   (Normal Range: 5.0-8.0) Protein: negative   (Normal Range: Negative) Urobilinogen: 0.2   (Normal Range: 0-1) Nitrite: negative   (Normal Range: Negative) Leukocyte Esterace: trace   (Normal Range: Negative)    Urine HCG: negative  Blood Tests     CBG Random:: 105      Last LDL:                                                 71 (05/06/2010 9:46:00 PM)        Diabetic Foot Exam Last Podiatry Exam Date: 05/06/2010    10-g (5.07) Semmes-Weinstein Monofilament  Test Performed by: Levon Hedger          Right Foot          Left Foot Visual Inspection               Test Control      normal  normal Site 1         normal         normal Site 2         normal         normal Site 3         normal         normal Site 4         normal         normal Site 5         normal         normal Site 6         normal         normal Site 7         normal         normal Site 8         normal         normal Site 9         normal         normal Site 10         normal         normal  Impression      normal         normal

## 2010-10-05 ENCOUNTER — Other Ambulatory Visit: Payer: Self-pay | Admitting: Nurse Practitioner

## 2010-10-05 ENCOUNTER — Encounter: Payer: Self-pay | Admitting: Nurse Practitioner

## 2010-10-05 ENCOUNTER — Encounter (INDEPENDENT_AMBULATORY_CARE_PROVIDER_SITE_OTHER): Payer: Self-pay | Admitting: Nurse Practitioner

## 2010-10-05 DIAGNOSIS — M25569 Pain in unspecified knee: Secondary | ICD-10-CM | POA: Insufficient documentation

## 2010-10-05 LAB — CONVERTED CEMR LAB
AST: 29 units/L (ref 0–37)
Basophils Absolute: 0 10*3/uL (ref 0.0–0.1)
Basophils Relative: 0 % (ref 0–1)
Bilirubin Urine: NEGATIVE
CO2: 22 meq/L (ref 19–32)
Chlamydia, DNA Probe: NEGATIVE
Creatinine, Ser: 0.66 mg/dL (ref 0.40–1.20)
Glucose, Urine, Semiquant: NEGATIVE
HCT: 40.9 % (ref 36.0–46.0)
Hemoglobin: 13.4 g/dL (ref 12.0–15.0)
KOH Prep: NEGATIVE
Ketones, urine, test strip: NEGATIVE
Lymphs Abs: 2 10*3/uL (ref 0.7–4.0)
MCHC: 32.8 g/dL (ref 30.0–36.0)
Nitrite: NEGATIVE
Platelets: 219 10*3/uL (ref 150–400)
Potassium: 4.5 meq/L (ref 3.5–5.3)
Protein, U semiquant: NEGATIVE
RBC: 4.58 M/uL (ref 3.87–5.11)
RDW: 14.7 % (ref 11.5–15.5)
Sodium: 140 meq/L (ref 135–145)
Specific Gravity, Urine: <1.005
Urobilinogen, UA: 0.2
WBC Urine, dipstick: NEGATIVE
WBC: 7.3 10*3/uL (ref 4.0–10.5)

## 2010-10-06 ENCOUNTER — Encounter (INDEPENDENT_AMBULATORY_CARE_PROVIDER_SITE_OTHER): Payer: Self-pay | Admitting: Nurse Practitioner

## 2010-10-07 ENCOUNTER — Encounter (INDEPENDENT_AMBULATORY_CARE_PROVIDER_SITE_OTHER): Payer: Self-pay | Admitting: Nurse Practitioner

## 2010-10-12 LAB — COMPREHENSIVE METABOLIC PANEL
ALT: 67 U/L — ABNORMAL HIGH (ref 0–35)
Alkaline Phosphatase: 167 U/L — ABNORMAL HIGH (ref 39–117)
CO2: 27 mEq/L (ref 19–32)
Chloride: 99 mEq/L (ref 96–112)
Creatinine, Ser: 0.51 mg/dL (ref 0.4–1.2)
GFR calc Af Amer: >60 mL/min (ref >60)
GFR calc non Af Amer: >60 mL/min (ref >60)
Glucose, Bld: 376 mg/dL — ABNORMAL HIGH (ref 70–99)
Potassium: 4.1 mEq/L (ref 3.5–5.1)
Sodium: 135 mEq/L (ref 135–145)
Total Protein: 7.7 g/dL (ref 6.0–8.3)

## 2010-10-12 LAB — CBC
Hemoglobin: 14.8 g/dL (ref 12.0–15.0)
MCHC: 33.8 g/dL (ref 30.0–36.0)
MCV: 90.6 fL (ref 78.0–100.0)
Platelets: 196 10*3/uL (ref 150–400)
RDW: 12.9 % (ref 11.5–15.5)

## 2010-10-12 LAB — DIFFERENTIAL
Basophils Absolute: 0 10*3/uL (ref 0.0–0.1)
Eosinophils Relative: 1 % (ref 0–5)
Monocytes Absolute: 0.3 10*3/uL (ref 0.1–1.0)
Monocytes Relative: 4 % (ref 3–12)

## 2010-10-12 LAB — GLUCOSE, CAPILLARY
Glucose-Capillary: 277 mg/dL — ABNORMAL HIGH (ref 70–99)
Glucose-Capillary: 295 mg/dL — ABNORMAL HIGH (ref 70–99)

## 2010-10-12 LAB — URINALYSIS, ROUTINE W REFLEX MICROSCOPIC
Bilirubin Urine: NEGATIVE
Leukocytes, UA: NEGATIVE
Urobilinogen, UA: 0.2 mg/dL (ref 0.0–1.0)
pH: 6.5 (ref 5.0–8.0)

## 2010-10-12 LAB — WET PREP, GENITAL: Trich, Wet Prep: NONE SEEN

## 2010-10-12 LAB — GC/CHLAMYDIA PROBE AMP, GENITAL: GC Probe Amp, Genital: NEGATIVE

## 2010-10-12 LAB — KETONES, QUALITATIVE: Acetone, Bld: NEGATIVE

## 2010-10-12 NOTE — Assessment & Plan Note (Signed)
Summary: Complete Physical Exam   Vital Signs:  Patient profile:   35 year old female Menstrual status:  irregular LMP:     09/08/2010 Weight:      181.1 pounds BMI:     36.71 Temp:     98.6 degrees F oral Pulse rate:   72 / minute Pulse rhythm:   regular Resp:     16 per minute BP sitting:   110 / 90  (left arm) Cuff size:   regular  Vitals Entered By: Levon Hedger (October 05, 2010 10:24 AM)  Nutrition Counseling: Patient's BMI is greater than 25 and therefore counseled on weight management options. CC: CPP, Hypertension Management Is Patient Diabetic? No Pain Assessment Patient in pain? no       Does patient need assistance? Functional Status Self care Ambulation Normal  Vision Screening:Left eye w/o correction: 20 / 20+1 Right Eye w/o correction: 20 / 20-! Both eyes w/o correction:  20/ 20+4        Vision Entered By: Levon Hedger (October 05, 2010 10:25 AM) LMP (date): 09/08/2010 LMP - Character: normal     Menstrual Status irregular Enter LMP: 09/08/2010   CC:  CPP and Hypertension Management.  History of Present Illness:  Pt into the office for CPE  PAP - last done 3 years ago; normal Menses - monthly but she received her first depoprovera injection on 08/18/2010.  Menses started 09/08/2010 but none since.  Mammogram - never had mammogram  no family history of breast cancer  Optho - no glasses or contacts.  no recent eye exam  Dental - No recent dental appt. She does still have some pain in her right upper.  Dental referral ordered in 04/2010.  Community liason present today during exam - Spanish  Diabetes Management History:      The patient is a 34 years old female who comes in for evaluation of DM Type 2.  She is (or has been) enrolled in the "Diabetic Education Program".  She states lack of understanding of dietary principles and is not following her diet appropriately.  No sensory loss is reported.  Self foot exams are not being  performed.  She is not checking home blood sugars.  She says that she is exercising.  Type of exercise includes: walking.  Duration of exercise is estimated to be 60 min.  She is doing this 5 times per week.        Hypoglycemic symptoms are not occurring.  No hyperglycemic symptoms are reported.        No changes have been made to her treatment plan since last visit.    Hypertension History:      She denies headache, chest pain, and palpitations.  She notes no problems with any antihypertensive medication side effects.  Pt is taking meds as ordered.        Positive major cardiovascular risk factors include diabetes and hypertension.  Negative major cardiovascular risk factors include female age less than 28 years old, no history of hyperlipidemia, and non-tobacco-user status.        Further assessment for target organ damage reveals no history of ASHD, cardiac end-organ damage (CHF/LVH), stroke/TIA, peripheral vascular disease, renal insufficiency, or hypertensive retinopathy.     Habits & Providers  Alcohol-Tobacco-Diet     Alcohol drinks/day: 0     Tobacco Status: never  Exercise-Depression-Behavior     Does Patient Exercise: yes     Exercise Counseling: to improve exercise regimen  Type of exercise: walking     Exercise (avg: min/session): 60 min.     Times/week: 5     Depression Counseling: not indicated; screening negative for depression     Drug Use: never  Comments: doing zumba twice per week PHQ-9 score = 10  Allergies (verified): No Known Drug Allergies  Review of Systems General:  Denies fever; +dental pain (pt has an outstanding dental referral). Eyes:  Denies blurring. ENT:  Denies earache. CV:  Denies chest pain or discomfort. Resp:  Denies cough. GI:  Denies abdominal pain, nausea, and vomiting. GU:  Denies dysuria. MS:  Complains of joint pain; bil knees R>L. Derm:  Denies dryness. Neuro:  Denies headaches. Psych:  Denies depression.  Physical  Exam  General:  alert.   Head:  normocephalic.   Eyes:  pupils round.   Ears:  moderate cerumen bilaterally Nose:  no nasal discharge.   Mouth:  poor dentition.   Neck:  supple.   Chest Wall:  no mass.   Breasts:  no abnormal thickening.   Lungs:  normal breath sounds.   Heart:  normal rate and regular rhythm.   Abdomen:  soft, non-tender, and normal bowel sounds.   Pulses:  R radial normal and L radial normal.   Extremities:  no edema Neurologic:  alert & oriented X3.   Skin:  color normal.    Diabetes Management Exam:       Nails:          Left foot: normal          Right foot: normal   Knee Exam  General:    obese.    Knee Exam:    Right:    Inspection:  Abnormal    Palpation:  Abnormal       Location:  patellar    Stability:  stable    Tenderness:  no    Swelling:  no    Erythema:  no    Left:    Inspection:  Abnormal    Palpation:  Abnormal       Location:  patellar    Stability:  stable    Tenderness:  no    Swelling:  no    Erythema:  no   Impression & Recommendations:  Problem # 1:  ROUTINE GYNECOLOGICAL EXAMINATION (ICD-V72.31)  PAP done rec optho and dental exam PHQ-9 score = 10  Orders: T- GC Chlamydia (59563)  Problem # 2:  FAMILY PLANNING (ICD-V25.09) pt to keep on depoprovera  Problem # 3:  HYPERTENSION, BENIGN ESSENTIAL (ICD-401.1)  Her updated medication list for this problem includes:    Lisinopril 5 Mg Tabs (Lisinopril) ..... One tablet by mouth daily for blood pressure  Problem # 4:  DIABETES MELLITUS, TYPE II (ICD-250.00)  Her updated medication list for this problem includes:    Metformin Hcl 500 Mg Tabs (Metformin hcl) ..... One tablet by mouth two times a day for diabetes    Lisinopril 5 Mg Tabs (Lisinopril) ..... One tablet by mouth daily for blood pressure  Problem # 5:  OBESITY (ICD-278.00) advised pt to stop zumba and to start walking  Problem # 6:  KNEE PAIN, BILATERAL (ICD-719.46)  Her updated medication list  for this problem includes:    Ibuprofen 800 Mg Tabs (Ibuprofen) ..... One tablet by mouth two times a day as needed for knee pain  Complete Medication List: 1)  Metformin Hcl 500 Mg Tabs (Metformin hcl) .... One tablet by mouth two times a  day for diabetes 2)  Lisinopril 5 Mg Tabs (Lisinopril) .... One tablet by mouth daily for blood pressure 3)  Blood Glucose Meter Kit (Blood glucose monitoring suppl) .... Use to check blood sugar once daily before breakfast 4)  Lancets Misc (Lancets) .... Use to check blood sugar daily before breakfast 5)  Blood Glucose Test Strp (Glucose blood) .... Use to check blood sugar before breakfast 6)  Depo-provera 150 Mg/ml Susp (Medroxyprogesterone acetate) .... One injection im every 12 weeks for family planning 7)  Ibuprofen 800 Mg Tabs (Ibuprofen) .... One tablet by mouth two times a day as needed for knee pain  Diabetes Management Assessment/Plan:      Her blood pressure goal is < 130/80.    Hypertension Assessment/Plan:      The patient's hypertensive risk group is category C: Target organ damage and/or diabetes.  Her calculated 10 year risk of coronary heart disease is 2 %.  Today's blood pressure is 110/90.  Her blood pressure goal is < 130/80.  Patient Instructions: 1)  Schedule an appointment for retasure exam to check your eyes. 2)  Your labs will be checked today and you will be notified of the results 3)  Knee pain - most likely due to zumba.  reduce zumba to only once per week and the other days walk for 15-20 minutes up to 30 minutes daily.  may take ibuprofen 800mg  by mouth two times a day as needed for pain.  be sure eat food before you take this med. 4)  Follow up with provider in 3 months for diabetes.   5)  Will need u/a, Hgba1c, cbg. 6)  You will need a tetanus on next visit Prescriptions: IBUPROFEN 800 MG TABS (IBUPROFEN) One tablet by mouth two times a day as needed for knee pain  #30 x 0   Entered and Authorized by:   Lehman Prom  FNP   Signed by:   Lehman Prom FNP on 10/05/2010   Method used:   Faxed to ...       [facility] - [facility] (retail)       [address] [place], [place]  [address]       Ph: [phone]       Fax: [phone]   RxID:   [id number] LANCETS  MISC (LANCETS) Use to check blood sugar daily before breakfast  #100 x 11   Entered and Authorized by:   Lehman Prom FNP   Signed by:   Lehman Prom FNP on 10/05/2010   Method used:   Faxed to ...       [facility] - [facility] (retail)       [address] [place], [place]  [address]       Ph: [phone]       Fax: [phone]   RxID:   [id number] BLOOD GLUCOSE TEST  STRP (GLUCOSE BLOOD) Use to check blood sugar before breakfast  #100 x 11   Entered and Authorized by:   Lehman Prom FNP   Signed by:   Lehman Prom FNP on 10/05/2010   Method used:   Faxed to ...       [facility] - [facility] (retail)       [address] [place], [place]  [address]       Ph: [phone]       Fax: [phone]  [phone]   RxID:   [id number] DEPO-PROVERA 150 MG/ML SUSP (MEDROXYPROGESTERONE ACETATE) One injection IM every 12 weeks for family planning  #1 x 3   Entered and Authorized by:   Lehman Prom FNP   Signed by:   Lehman Prom FNP on 10/05/2010   Method used:   Faxed to ...       [facility] - [facility] (retail)       [address] [place], [place]  [address]       Ph: [phone]       Fax: [phone]   RxID:   [id number] LISINOPRIL 5 MG TABS (LISINOPRIL) One tablet by mouth daily for blood pressure  #30 x 11   Entered and Authorized by:   Lehman Prom FNP   Signed by:   Lehman Prom FNP on 10/05/2010   Method used:   Faxed to ...       [facility] - [facility] (retail)       [address] [place], [place]  [address]       Ph:  [phone]       Fax: [phone]   RxID:   [id number] METFORMIN HCL 500 MG TABS (METFORMIN HCL) One tablet by mouth two times a day for diabetes  #60 x 11   Entered and Authorized by:   Lehman Prom FNP   Signed by:   Lehman Prom FNP on 10/05/2010   Method used:   Faxed to ...       [facility] - [facility] (retail)       [address] [place], [place]  [address]       Ph: [phone]       Fax: [phone]   RxID:   [id number]    Orders Added: 1)  Est. Patient age 10-39 [99395] 2)  T- GC Chlamydia [69678]    Laboratory Results   Urine Tests  Date/Time Received: October 05, 2010 10:35 AM   Routine Urinalysis   Color: lt. yellow Appearance: Clear Glucose: negative   (Normal Range: Negative) Bilirubin: negative   (Normal Range: Negative) Ketone: negative   (Normal Range: Negative) Spec. Gravity: <1.005   (Normal Range: 1.003-1.035) Blood: negative   (Normal Range: Negative) pH: 5.5   (Normal Range: 5.0-8.0) Protein: negative   (Normal Range: Negative) Urobilinogen: 0.2   (Normal Range: 0-1) Nitrite: negative   (Normal Range: Negative) Leukocyte Esterace: negative   (Normal Range: Negative)    Date/Time Received: October 05, 2010 2:05 PM   [place]et [place]/KOH Source: vaginal WBC/hpf: 1-5 Bacteria/hpf: rare Clue cells/hpf: none Yeast/hpf: none Trichomonas/hpf: none  Other Tests  Rapid HIV: negative   Prevention & Chronic Care Immunizations   Influenza vaccine: Fluvax 3+  (05/06/2010)    Tetanus booster: Not documented   Td booster deferral: Not available  (10/05/2010)    Pneumococcal vaccine: Pneumovax  (04/09/2010)  Other Screening   Pap smear: Not documented   Smoking status: never  (10/05/2010)  Diabetes Mellitus   HgbA1C: 6.2  (07/06/2010)    Eye exam: Not documented    Foot exam: yes  (08/17/2010)   High risk foot: Not documented   Foot care education: Done  (04/09/2010)    Urine  microalbumin/creatinine ratio: Not documented  Lipids   Total Cholesterol: 159  (05/06/2010)   LDL: 71  (05/06/2010)   LDL Direct: Not documented  HDL: 50  (05/06/2010)   Triglycerides: 192  (05/06/2010)  Hypertension   Last Blood Pressure: 110 / 90  (10/05/2010)   Serum creatinine: 0.53  (05/06/2010)   Serum potassium 4.4  (05/06/2010)  Self-Management Support :    Diabetes self-management support: Not documented    Hypertension self-management support: Not documented   Nursing Instructions: Give tetanus booster today     Laboratory Results   Urine Tests    Routine Urinalysis   Color: lt. yellow Appearance: Clear Glucose: negative   (Normal Range: Negative) Bilirubin: negative   (Normal Range: Negative) Ketone: negative   (Normal Range: Negative) Spec. Gravity: <1.005   (Normal Range: 1.003-1.035) Blood: negative   (Normal Range: Negative) pH: 5.5   (Normal Range: 5.0-8.0) Protein: negative   (Normal Range: Negative) Urobilinogen: 0.2   (Normal Range: 0-1) Nitrite: negative   (Normal Range: Negative) Leukocyte Esterace: negative   (Normal Range: Negative)      Wet Mount Wet Mount KOH: Negative  Other Tests  Rapid HIV: negative

## 2010-10-12 NOTE — Progress Notes (Signed)
Summary: Office Visit//health screen  Office Visit//health screen   Imported By: Arta Bruce 10/05/2010 16:24:24  _____________________________________________________________________  External Attachment:    Type:   Image     Comment:   External Document

## 2010-10-12 NOTE — Letter (Signed)
Summary: *HSN Results Follow up  [facility]  [address] [place], [place] [address]   Phone: [phone]  Fax: [phone]      10/06/2010   Rhonda Waller [address] [place], [place]  [address]   Dear  Ms. Rhonda Waller,                            ____S.Drinkard,FNP   ____D. Gore,FNP       ____B. McPherson,MD   ____V. Rankins,MD    ____E. Mulberry,MD    __X__N. Daphine Deutscher, FNP  ____D. Reche Dixon, MD    ____K. Philipp Deputy, MD    ____Other     This letter is to inform you that your recent test(s):  ____X___Pap Smear    ___X____Lab Test     _______X-ray    ____X___ is within acceptable limits  _______ requires a medication change  _______ requires a follow-up lab visit  _______ requires a follow-up visit with your provider   Comments: Labs done during recent office visit are normal.    Pap Smear results ___________________.       _________________________________________________________ If you have any questions, please contact our office [phone].                    Sincerely,    Rhonda Prom FNP [facility]

## 2010-10-12 NOTE — Miscellaneous (Signed)
Summary: Labs for CPE visit  Clinical Lists Changes  Orders: Added new Test order of T-Comprehensive Metabolic Panel [id number]) - Signed Added new Test order of T-CBC w/Diff [id number]) - Signed Added new Service order of Rapid HIV  ([id number]) - Signed Added new Test order of T-Syphilis Test (RPR) [phone]) - Signed Added new Test order of T-TSH [id number]) - Signed Added new Test order of T- Hemoglobin A1C ([id number]) - Signed Added new Test order of T-Urine Microalbumin w/creat. ratio [phone]) - Signed Added new Service order of UA Dipstick w/o Micro (manual) ([id number]) - Signed Observations: Added new observation of CHIEF CMPLNT: CPP (10/05/2010 10:08) Added new observation of BMI: 36.71  (10/05/2010 10:08) Added new observation of BP DIASTOLIC: 90 mmHg (10/05/2010 51:88) Added new observation of BP SYSTOLIC: 110 mmHg (10/05/2010 10:08) Added new observation of RESP RATE: 16 /min (10/05/2010 10:08) Added new observation of PULSE RATE: 70 /min (10/05/2010 10:08) Added new observation of TEMPERATURE: 98.6 deg F (10/05/2010 10:08) Added new observation of WEIGHT: 181.1 lb (10/05/2010 10:08) Added new observation of TEMP SITE: oral  (10/05/2010 10:08) Added new observation of PULSE RHYTHM: regular  (10/05/2010 10:08) Added new observation of BP CUFF SIZE: regular  (10/05/2010 10:08)      Vital Signs:  Patient profile:   35 year old female Menstrual status:  regular Weight:      181.1 pounds BMI:     36.71 Temp:     98.6 degrees F oral Pulse rate:   70 / minute Pulse rhythm:   regular Resp:     16 per minute BP sitting:   110 / 90  (left arm) Cuff size:   regular  Vitals Entered By: Levon Hedger (October 05, 2010 10:16 AM)  CC: CPP Is Patient Diabetic? No Pain Assessment Patient in pain? no       Does patient need assistance? Functional Status Self care Ambulation Normal

## 2010-10-27 LAB — POCT CARDIAC MARKERS
Myoglobin, poc: 46.4 ng/mL (ref 12–200)
Troponin i, poc: <0.05 ng/mL (ref 0.00–0.09)

## 2010-10-27 LAB — CBC
MCV: 88.2 fL (ref 78.0–100.0)
WBC: 9.6 10*3/uL (ref 4.0–10.5)

## 2010-10-27 LAB — BASIC METABOLIC PANEL
BUN: 4 mg/dL — ABNORMAL LOW (ref 6–23)
Calcium: 9.3 mg/dL (ref 8.4–10.5)
Creatinine, Ser: 0.54 mg/dL (ref 0.4–1.2)
GFR calc non Af Amer: >60 mL/min (ref >60)

## 2010-10-27 LAB — DIFFERENTIAL
Eosinophils Absolute: 0.2 10*3/uL (ref 0.0–0.7)
Lymphocytes Relative: 27 % (ref 12–46)
Lymphs Abs: 2.5 10*3/uL (ref 0.7–4.0)
Neutro Abs: 6.4 10*3/uL (ref 1.7–7.7)
Neutrophils Relative %: 67 % (ref 43–77)

## 2010-10-27 LAB — D-DIMER, QUANTITATIVE: D-Dimer, Quant: <0.22 ug/mL-FEU (ref 0.00–0.48)

## 2011-04-25 LAB — URINALYSIS, ROUTINE W REFLEX MICROSCOPIC
Bilirubin Urine: NEGATIVE
Ketones, ur: NEGATIVE
Nitrite: NEGATIVE
pH: 5.5

## 2011-04-26 LAB — CBC
HCT: 31.9 — ABNORMAL LOW
Hemoglobin: 10.6 — ABNORMAL LOW
MCHC: 33.3
RDW: 16.5 — ABNORMAL HIGH

## 2011-04-26 LAB — COMPREHENSIVE METABOLIC PANEL
BUN: 7
Calcium: 8.8
Glucose, Bld: 82
Sodium: 135
Total Protein: 6

## 2011-04-26 LAB — URINALYSIS, ROUTINE W REFLEX MICROSCOPIC
Nitrite: NEGATIVE
Specific Gravity, Urine: <1.005 — ABNORMAL LOW
pH: 5.5

## 2011-04-26 LAB — URIC ACID: Uric Acid, Serum: 4.7

## 2011-04-26 LAB — URINE MICROSCOPIC-ADD ON

## 2011-04-29 LAB — COMPREHENSIVE METABOLIC PANEL
Albumin: 2.7 g/dL — ABNORMAL LOW (ref 3.5–5.2)
Alkaline Phosphatase: 215 U/L — ABNORMAL HIGH (ref 39–117)
BUN: 9 mg/dL (ref 6–23)
Calcium: 8.7 mg/dL (ref 8.4–10.5)
Potassium: 4.4 mEq/L (ref 3.5–5.1)
Sodium: 134 mEq/L — ABNORMAL LOW (ref 135–145)
Total Protein: 6.5 g/dL (ref 6.0–8.3)

## 2011-04-29 LAB — CBC
HCT: 35.4 % — ABNORMAL LOW (ref 36.0–46.0)
Hemoglobin: 10.2 g/dL — ABNORMAL LOW (ref 12.0–15.0)
MCHC: 33.5 g/dL (ref 30.0–36.0)
Platelets: 195 10*3/uL (ref 150–400)
RBC: 3.59 MIL/uL — ABNORMAL LOW (ref 3.87–5.11)
RDW: 16.8 % — ABNORMAL HIGH (ref 11.5–15.5)
WBC: 9.6 10*3/uL (ref 4.0–10.5)

## 2011-04-29 LAB — RPR: RPR Ser Ql: NONREACTIVE

## 2012-07-25 NOTE — L&D Delivery Note (Signed)
Delivery Note At 4:29 AM a viable female was delivered via Vaginal, Spontaneous Delivery (Presentation: left; Occiput Anterior).  APGAR: 5, 9; weight pending.   Placenta status: Intact, Spontaneous.  Cord: 3 vessels.  Cord pH: not drawn  Anesthesia: Epidural  Episiotomy: None Lacerations: None Suture Repair: n/a Est. Blood Loss (mL): 500 cc  Mom to postpartum.  Baby to Couplet care / Skin to Skin.  Called to pt room with mother stating she felt ready to push. Pt had a thick anterior lip that was easily reducible. Pt pushed through about 3 more contractions, with approximately 20 second shoulder dystocia after delivery of the head (released with McRobert's, suprapubic pressure, and release of anterior cervical lip behind the baby's shoulder), with subsequent delivery of viable stunned female over intact perineum. Cord clamped and cut and baby taken to warmer, with APGARs as above; baby recovered quickly with stimulation, not requiring any supplemental O2. Baby placed skin to skin afterward.  Dr. Reola Calkins was present for the delivery in its entirety and assisted with the delivery including release of the shoulder dystocia as detailed above.  Bobbye Morton, MD PGY-2, [facility] 06/18/2013, 4:48 AM  I have seen and examined this patient and agree with above documentation in the resident's note. Pt pushed well and delivery complicated by approx 20 sec shoulder dystocia due to a thick anterior lip. Maneuver's as above. Baby initially requiring stimulation but recovering quickly. Attempted to obtain cord pH but clamp released prior to collection. Apgars 5, 9. Baby to skin to skin and Mom to postpartum.   Rulon Abide, M.D. [facility] Fellow 06/18/2013 4:53 AM

## 2013-04-04 ENCOUNTER — Other Ambulatory Visit (HOSPITAL_COMMUNITY): Payer: Self-pay | Admitting: Physician Assistant

## 2013-04-04 DIAGNOSIS — Z0489 Encounter for examination and observation for other specified reasons: Secondary | ICD-10-CM

## 2013-04-05 ENCOUNTER — Other Ambulatory Visit (HOSPITAL_COMMUNITY): Payer: Self-pay | Admitting: Physician Assistant

## 2013-04-05 ENCOUNTER — Ambulatory Visit (HOSPITAL_COMMUNITY)
Admission: RE | Admit: 2013-04-05 | Discharge: 2013-04-05 | Disposition: A | Discharge status: Home / Self Care | Payer: Medicaid Other | Source: Ambulatory Visit | Attending: Physician Assistant | Admitting: Physician Assistant

## 2013-04-05 DIAGNOSIS — O24919 Unspecified diabetes mellitus in pregnancy, unspecified trimester: Secondary | ICD-10-CM | POA: Insufficient documentation

## 2013-04-05 DIAGNOSIS — O139 Gestational [pregnancy-induced] hypertension without significant proteinuria, unspecified trimester: Secondary | ICD-10-CM | POA: Insufficient documentation

## 2013-04-05 DIAGNOSIS — Z0489 Encounter for examination and observation for other specified reasons: Secondary | ICD-10-CM

## 2013-04-05 DIAGNOSIS — O358XX0 Maternal care for other (suspected) fetal abnormality and damage, not applicable or unspecified: Secondary | ICD-10-CM | POA: Insufficient documentation

## 2013-04-05 DIAGNOSIS — Z363 Encounter for antenatal screening for malformations: Secondary | ICD-10-CM | POA: Insufficient documentation

## 2013-04-05 DIAGNOSIS — Z1389 Encounter for screening for other disorder: Secondary | ICD-10-CM | POA: Insufficient documentation

## 2013-04-06 ENCOUNTER — Inpatient Hospital Stay (HOSPITAL_COMMUNITY): Admission: RE | Admit: 2013-04-06 | Payer: Self-pay | Source: Ambulatory Visit

## 2013-04-08 ENCOUNTER — Ambulatory Visit: Payer: Medicaid Other | Admitting: *Deleted

## 2013-04-08 ENCOUNTER — Encounter: Payer: Medicaid Other | Attending: Family Medicine | Admitting: *Deleted

## 2013-04-08 VITALS — Ht 59.0 in | Wt 213.7 lb

## 2013-04-08 DIAGNOSIS — E119 Type 2 diabetes mellitus without complications: Secondary | ICD-10-CM | POA: Insufficient documentation

## 2013-04-08 DIAGNOSIS — Z713 Dietary counseling and surveillance: Secondary | ICD-10-CM | POA: Insufficient documentation

## 2013-04-08 DIAGNOSIS — O24912 Unspecified diabetes mellitus in pregnancy, second trimester: Secondary | ICD-10-CM

## 2013-04-08 DIAGNOSIS — O24919 Unspecified diabetes mellitus in pregnancy, unspecified trimester: Secondary | ICD-10-CM | POA: Insufficient documentation

## 2013-04-08 NOTE — Progress Notes (Signed)
Nutrition note: 1st visit consult & DM diet education Pt has h/o obesity & stated she had DM prior to her pregnancy (~71yrs). Pt has gained 15.7# @ [redacted]w[redacted]d (due 07/24/13), which is > expected. Pt reports eating 3 meals/d & drinks water, 1c juice/d & soda occ. Pt reports having nausea occ but no vomiting or heartburn. NKFA. Pt is taking PNV.  Pt reports doing Zumba for 1hr 3x/wk.  Pt reports no knowledge of DM diet. Pt received verbal & written education in Spanish via interpreter, Trinna Post, about DM during pregancy diet. Encouraged pt to add a walk on days she does not do zumba. Discussed wt gain goals of 11-20# or 0.5#/wk. Pt agrees to follow DM diet with 3 meals & 3 snacks/d with proper CHO/ protein combination.  Pt has WIC & plans to BF. F/u in 2-4 wks Blondell Reveal, MS, RD, LDN

## 2013-04-08 NOTE — Progress Notes (Signed)
DIABETES: Through interpreter, patient notes that she has been diabetic for approximately 2 years. She was previously treated through [facility] and was on medication. Since [facility] closed she has had no healthcare. It has been approximately 8 months since she has had medication. I obtained a 1hpp reading of 289. Patient is approximately [redacted]wks pregnant. Patient met with Dietitian. I consulted with Dr. Debroah Loop and we will bring the patient back this Thursday for evaluation of diabetes management. Dispensed TrueTrack glucometer, strips and lancets. Instructed in glucometer use with teach back. Will test four times per day.

## 2013-04-09 ENCOUNTER — Encounter: Payer: Self-pay | Admitting: *Deleted

## 2013-04-11 ENCOUNTER — Encounter: Payer: Self-pay | Admitting: Obstetrics & Gynecology

## 2013-04-11 ENCOUNTER — Ambulatory Visit (INDEPENDENT_AMBULATORY_CARE_PROVIDER_SITE_OTHER): Payer: Medicaid Other | Admitting: Obstetrics & Gynecology

## 2013-04-11 VITALS — BP 123/85 | Temp 98.8°F | Wt 210.0 lb

## 2013-04-11 DIAGNOSIS — O24913 Unspecified diabetes mellitus in pregnancy, third trimester: Secondary | ICD-10-CM | POA: Insufficient documentation

## 2013-04-11 DIAGNOSIS — E119 Type 2 diabetes mellitus without complications: Secondary | ICD-10-CM

## 2013-04-11 DIAGNOSIS — O24919 Unspecified diabetes mellitus in pregnancy, unspecified trimester: Secondary | ICD-10-CM

## 2013-04-11 LAB — POCT URINALYSIS DIP (DEVICE)
Bilirubin Urine: NEGATIVE
Hgb urine dipstick: NEGATIVE
Ketones, ur: NEGATIVE mg/dL
Nitrite: POSITIVE — AB
Specific Gravity, Urine: 1.01 (ref 1.005–1.030)
pH: 6 (ref 5.0–8.0)

## 2013-04-11 NOTE — Patient Instructions (Signed)
Diabetes mellitus Tipo 1 o Tipo 2 durante el embarazo  (Type 1 or Type 2 Diabetes Mellitus During Pregnancy) La diabetes mellitus tipo 1, que en general se la llama simplemente diabetes, es una enfermedad prolongada (crnica). Ocurre cuando las clulas de los islotes pncreticos que producen la insulina (una hormona) se destruyen y no pueden producirla. En la diabetes gestacional, el pncreas no produce suficiente insulina (una hormona), las clulas son menos sensibles a la insulina que se produce (resistencia a la insulina), o ambos. La insulina es necesaria para movilizar los azcares de los alimentos a las clulas de los tejidos. Las clulas de los tejidos Cendant Corporationutilizan los azcares para Psychiatristobtener energa. La falta de insulina o la falta de una respuesta normal a la insulina hace que el exceso de azcar se acumule en la sangre en lugar de Customer service managerpenetrar en las clulas de los tejidos. Como resultado, se desarrollan niveles altos de azcar en la sangre (hiperglucemia).  Si se mantienen los niveles de Event organiserglucosa en sangre en un rango normal antes y Therapist, sportsdurante el [place]embarazo, las mujeres pueden tener un embarazo saludable. Si no controla eBaybien los niveles de glucosa en Garfieldsangre, puede haber riesgos para usted, para el beb que no ha nacido (feto), durante el Camptontrabajo de parto y Naalehuel parto, o en el beb recin nacido.  FACTORES DE RIESGO   Una persona est predispuesta a desarrollar diabetes tipo 1 si alguien en su familia padece la enfermedad y se expone a ciertos desencadenantes ambientales adicionales.  Tiene una mayor probabilidad de desarrollar diabetes tipo 2 si tiene antecedentes familiares de diabetes y tambin tiene uno o ms de los siguientes factores de riesgo:  Tener sobrepeso.  Tiene un estilo de vida inactivo.  Tiene una historia familiar de consumo consistente de alimentos con muchas caloras. SNTOMAS Los sntomas de la diabetes son:   Aumento de la sed (polidipsia).  Aumento de ganas de Geographical information systems officerorinar  (poliuria).  Aumento de ganas de orinar durante la noche (nicturia).  Prdida de peso. La prdida de peso que puede ser muy rpida.  Infecciones frecuentes y recurrentes.  Cansancio (fatiga).  Debilidad.  Cambios en la visin, como visin borrosa.  Olor a Water quality scientistfruta en el aliento.  Dolor abdominal.  Nuseas o vmitos. DIAGNSTICO  La diabetes se diagnostica cuando hay aumento de los niveles de glucosa en la [place]angre. El nivel de glucosa en la sangre puede controlarse en uno o ms de los siguientes anlisis de sangre:   Medicin de glucosa de sangre en [place]. No se le permitir comer durante al menos 8 horas antes de que se tome [place]una muestra de [place]angre.  Anlisis al azar de glucosa en sangre. El nivel de glucosa en sangre se controla en cualquier momento del da sin importar el momento en que haya comido.  Anlisis de glucosa de sangre de hemoglobina A1c. Un anlisis de hemoglobina A1c proporciona informacin sobre el control de la glucosa en la sangre durante los ltimos 3 meses.  Prueba oral de tolerancia a la glucosa (SOG). La glucosa en la sangre se mide despus de no haber comido (ayuno) durante 1-3 horas y despus de beber una bebida que contiene glucosa. La prueba oral de tolerancia a la glucosa se Walt Disneyrealiza durante las semanas 24 a 28 del embarazo. TRATAMIENTO   Usted tendr que tomar medicamentos para la diabetes o insulina diariamente para Pharmacologistmantener los niveles de glucosa en sangre en el rango deseado.  Usted tendr que coincidir la dosis de insulina con el ejercicio y Soil scientistla eleccin  de alimentos saludables. El objetivo del tratamiento es mantener el nivel de glucosa en sangre a 60-99 mg/dl antes de la comida (preprandial), a la hora de acostarse y durante la noche durante el Max Meadowsembarazo. El objetivo del tratamiento es mantener el mayor nivel de azcar en sangre despus de la comida (glucosa postprandial) en 100-140 mg/dl.   INSTRUCCIONES PARA EL CUIDADO EN EL HOGAR   Haga controlar  su nivel de hemoglobina A1c dos veces al ao.  Realice el control diario de la glucosa en sangre segn las indicaciones de su mdico. Es comn realizar controles con frecuencia de la glucosa en sangre.  Supervise las cetonas en la orina cuando est enferma y segn las indicaciones de su mdico.  Tome su medicamento para la diabetes y la insulina segn las indicaciones de su mdico para Pharmacologistmantener su nivel de glucosa en sangre en su rango deseado.  Nunca se quede sin medicamento para la diabetes o sin insulina. Es necesario recibirla CarMaxtodos los das.  Ajuste la Jacobs Engineeringinsulina sobre la base de la ingesta de hidratos de carbono. Los hidratos de carbono pueden aumentar los niveles de glucosa en la sangre, pero deben incluirse en su dieta. Aportan vitaminas, minerales y Guyanafibra que son Neomia Dearuna parte esencial de una dieta saludable. Se encuentran en frutas, verduras, granos enteros, productos lcteos, legumbres y alimentos que contienen azcares aadidos.  Consuma alimentos saludables. Alterne 3 comidas con 3 colaciones.  Mantenga un aumento de peso saludable. El aumento del peso total vara de acuerdo con el ndice de masa corporal antes del embarazo [facility]  Lleve una tarjeta de alerta mdica o use un brazalete o medalla de alerta mdica.  Lleve consigo un refrigerio de 15 gramos de hidrato de carbonos en todo momento para Chief Operating Officercontrolar los niveles bajos de glucosa en sangre (hipoglucemia). Algunos ejemplos de colaciones de 15 gramos de hidratos de carbono son:  Tabletas de glucosa, 3 o 4.  Gel de glucosa, tubo de 15 gramos.  Pasas de uva, 2 cucharadas (24 gramos).  Caramelos de goma, 6.  Galletas de Linds Crossinganimales, 8.  Jugo de fruta, gaseosa comn, o leche baja en grasa, 4 onzas (120 ml).  Pastillas gomitas, 9.  Reconocer la hipoglucemia. Durante el embarazo la hipoglucemia se produce cuando hay niveles de glucosa en sangre de 60 mg/dl o menos. El riesgo de hipoglucemia aumenta durante el ayuno o saltearse  las comidas, durante o despus del ejercicio intenso y Sperrydurante el sueo. Los sntomas de hipoglucemia son:  Temblores o sacudidas.  Disminucin de capacidad de concentracin.  Sudoracin.  Aumento en el ritmo cardaco.  Dolor de cabeza.  M.D.C. HoldingsBoca seca.  Hambre.  Irritabilidad.  Ansiedad.  Sueo agitado.  Alteracin del habla o de la coordinacin.  Confusin.  Tratar la hipoglucemia rpidamente. Si usted est alerta y puede tragar con seguridad, siga la regla de 15:15:  Tome de 15 a 20 gramos de glucosa de accin rpida o hidratos de carbono. Las opciones de accin rpida son un gel de glucosa, tabletas de glucosa, o 4 onzas (120 ml) de jugo de frutas, soda regular, o leche baja en grasa.  Compruebe su nivel de glucosa en sangre 15 minutos despus de tomar la glucosa.  Tome de 15 a 20 gramos ms de glucosa si el nivel repetido de glucosa en la sangre todava es de 70 mg/dl o inferior.  Coma una comida o un refrigerio dentro de 1 hora una vez que los niveles de glucosa en la sangre vuelven a la normalidad.  Maricela CuretHaga  actividad fsica en por lo menos 30 minutos al da o como lo indique su mdico. Se recomienda diez minutos de actividad fsica cronometrados 30 minutos despus de cada comida para Chief Operating Officer los niveles de glucosa postprandial en [place].   Est alerta a la poliuria y polidipsia, que son los primeros signos de la hiperglucemia. El temprano conocimiento de la hiperglucemia permite un tratamiento oportuno. Controle la hiperglucemia segn le indic su mdico.  Ajuste su dosis de insulina y la ingesta de alimentos, segn sea necesario, si inicia un nuevo ejercicio o deporte.  Siga con su plan diario de enfermo en algn momento que no puede comer o beber como de costumbre.  Evite el tabaco y el alcohol.  Concurra regularmente a las visitas de control con el mdico.  Siga el consejo de su mdico respecto a antes del parto y a las actividades de visitas de control despus  del nacimiento (despus del parto) en lo referente a la planificacin de las comidas, al ejercicio, a los medicamentos, a las vitaminas, a los anlisis de [place], a otras pruebas mdicas y fsicas.  Cuide diariamente la piel y los pies. Examine su piel y sus pies diariamente para detectar cortes, moretones, enrojecimiento, problemas en las uas, sangrado, ampollas o llagas. Anualmente debe hacerse un examen de los pies por un especialista.  Cepllese los dientes y encas por lo menos dos veces al da y use hilo dental al menos una vez por da. Concurra regularmente a las visitas de control con el dentista.  Programe un examen de vista durante el primer trimestre de su embarazo o como lo indique su mdico.  Comparta su plan de control de diabetes en el trabajo o en la escuela.  Mantngase al da con las vacunas.  Aprenda a Dealer.  Obtenga educacin continuada y ayuda para la diabetes cuando sea necesario. SOLICITE ATENCIN MDICA SI:   No puede comer alimentos o beber por ms de 6 horas.  Tiene nuseas o ha vomitado durante ms de 6 horas.  Tiene un nivel de glucosa en sangre de 200 mg/dl y [employer].  Presenta algn cambio en el estado mental.  Tiene problemas de visin.  Sufre un dolor persistente de Training and development officer.  Siente dolor o molestias en el abdomen.  Desarrolla una enfermedad grave adicional.  Tiene diarrea durante ms de 6 horas.  Ha estado enferma o ha tenido fiebre durante un par [address] y no mejora. SOLICITE ATENCIN MDICA DE INMEDIATO SI:  Tiene dificultad para respirar.  Ya no siente los movimientos del beb.  Est sangrando o tiene flujo vaginal.  Comienza a tener contracciones o trabajo de [place] prematuro. ASEGRESE DE QUE:  Comprende estas instrucciones.  Controlar su enfermedad.  Solicitar ayuda de inmediato si no mejora o si empeora. Document Released: 04/04/2012 Document Revised: 06/27/2012 [facility] Patient Information 2014  [place], [place].

## 2013-04-11 NOTE — Progress Notes (Signed)
Home monitoring of BS for 3 days, pp up to 221, fbs 140-60. Will add glyburide 2.5 mg BID, RTC in 4 days, see DM nurse. Transferred for [facility], was treated 4 years ago for type 2 DM at [facility]

## 2013-04-11 NOTE — Progress Notes (Signed)
Nutrition note: f/u on DM (see 'notes' for 1st visit consult) Pt has gained 12# @ [redacted]w[redacted]d, which is wnl. But pt lost 3.7# since 04/08/13 and stated she has made many changes. Pt reports eating 3 meals & 2 snacks/d. Pt's BS: fasting- 156-177; 2hr pp- 152-221 Pt stated she has made some changes since last appt 9/15 - increased walking & water and decreased CHO servings. Reviewed BS goals of fasting <90 & 2hr pp <120. Encouraged pt to include a protein source with all meals & snacks. F/u in 2-4 wks Blondell Reveal, MS, RD, LDN

## 2013-04-11 NOTE — Progress Notes (Signed)
Pulse 91 Patient states that she was going to a [facility] was diagnosed of DM however that clinic closed and had stopped with management all together. She did not seek for another clinic.

## 2013-04-12 LAB — OBSTETRIC PANEL
Basophils Relative: 0 % (ref 0–1)
Eosinophils Absolute: 0.1 10*3/uL (ref 0.0–0.7)
Eosinophils Relative: 1 % (ref 0–5)
Hepatitis B Surface Ag: NEGATIVE
MCH: 29.2 pg (ref 26.0–34.0)
MCHC: 33.3 g/dL (ref 30.0–36.0)
MCV: 87.7 fL (ref 78.0–100.0)
Neutrophils Relative %: 80 % — ABNORMAL HIGH (ref 43–77)
Platelets: 235 10*3/uL (ref 150–400)
RDW: 14.2 % (ref 11.5–15.5)

## 2013-04-12 LAB — HIV ANTIBODY (ROUTINE TESTING W REFLEX): HIV: NONREACTIVE

## 2013-04-12 LAB — CULTURE, OB URINE: Organism ID, Bacteria: NO GROWTH

## 2013-04-15 ENCOUNTER — Ambulatory Visit (INDEPENDENT_AMBULATORY_CARE_PROVIDER_SITE_OTHER): Payer: Medicaid Other | Admitting: Advanced Practice Midwife

## 2013-04-15 ENCOUNTER — Encounter: Payer: Medicaid Other | Admitting: *Deleted

## 2013-04-15 VITALS — BP 117/76 | Wt 210.2 lb

## 2013-04-15 DIAGNOSIS — O24919 Unspecified diabetes mellitus in pregnancy, unspecified trimester: Secondary | ICD-10-CM

## 2013-04-15 DIAGNOSIS — O24913 Unspecified diabetes mellitus in pregnancy, third trimester: Secondary | ICD-10-CM

## 2013-04-15 LAB — HEMOGLOBINOPATHY EVALUATION
Hemoglobin Other: 0 %
Hgb A2 Quant: 3 % (ref 2.2–3.2)
Hgb A: 97 % (ref 96.8–97.8)
Hgb S Quant: 0 %

## 2013-04-15 LAB — POCT URINALYSIS DIP (DEVICE)
Bilirubin Urine: NEGATIVE
Hgb urine dipstick: NEGATIVE
Ketones, ur: NEGATIVE mg/dL
Nitrite: NEGATIVE
Protein, ur: NEGATIVE mg/dL
pH: 5.5 (ref 5.0–8.0)

## 2013-04-15 MED ORDER — GLYBURIDE 2.5 MG PO TABS: 2.5000 mg | ORAL_TABLET | Freq: Two times a day (BID) | ORAL | Status: DC

## 2013-04-15 NOTE — Progress Notes (Signed)
DIABETES: Patient was seen here 04/11/13 for evaluation and initiation of Glyburide 2.5mg  BID. Transmission not completed. Patient had also stopped testing regularly. Patient will start medication the evening and continue BID breakfast & HS. She will test 4Xdaily as previously directed. Patient here with Interpreter. True Test supplies given.

## 2013-04-15 NOTE — Addendum Note (Signed)
Addended by: Adam Phenix on: 04/15/2013 10:45 AM   Modules accepted: Orders

## 2013-04-15 NOTE — Progress Notes (Signed)
Doing well.  Good fetal movement, denies vaginal bleeding, LOF, cramping/contractions.  Just started glucose testing and Glyburide.  Fasting 174 today.  Will see diabetic educator today. Consult Dr Macon Large, plan to see pt in 1 week to evaluate diet changes, effect of medication.

## 2013-04-15 NOTE — Progress Notes (Signed)
Pulse: 86

## 2013-04-20 ENCOUNTER — Encounter: Payer: Self-pay | Admitting: *Deleted

## 2013-04-22 ENCOUNTER — Ambulatory Visit (INDEPENDENT_AMBULATORY_CARE_PROVIDER_SITE_OTHER): Payer: Medicaid Other | Admitting: Advanced Practice Midwife

## 2013-04-22 ENCOUNTER — Encounter: Payer: Self-pay | Admitting: Advanced Practice Midwife

## 2013-04-22 VITALS — BP 120/83 | Temp 99.1°F | Wt 208.7 lb

## 2013-04-22 DIAGNOSIS — O24913 Unspecified diabetes mellitus in pregnancy, third trimester: Secondary | ICD-10-CM

## 2013-04-22 DIAGNOSIS — O24919 Unspecified diabetes mellitus in pregnancy, unspecified trimester: Secondary | ICD-10-CM

## 2013-04-22 DIAGNOSIS — E119 Type 2 diabetes mellitus without complications: Secondary | ICD-10-CM

## 2013-04-22 MED ORDER — GLYBURIDE 2.5 MG PO TABS: 5.0000 mg | ORAL_TABLET | Freq: Two times a day (BID) | ORAL | Status: DC

## 2013-04-22 NOTE — Progress Notes (Signed)
U/S scheduled October 13th at 1015 am.

## 2013-04-22 NOTE — Progress Notes (Signed)
Doing well.  Good fetal movement, denies vaginal bleeding, LOF, regular contractions.  Reviewed glucose meter hx, fastings 97-140s, PP 120s to 210s.  Pt taking Glyburide 2.5 mg BID. Consult Dr Penne Lash.  Increase Glyburide to 5 mg BID.  Reevaluate in 1 week, pt to see diabetic educator next week.

## 2013-04-22 NOTE — Progress Notes (Signed)
P=93 

## 2013-04-24 ENCOUNTER — Encounter: Payer: Self-pay | Admitting: *Deleted

## 2013-04-25 ENCOUNTER — Encounter: Payer: Self-pay | Admitting: *Deleted

## 2013-04-29 ENCOUNTER — Encounter: Payer: Medicaid Other | Attending: Family Medicine | Admitting: *Deleted

## 2013-04-29 ENCOUNTER — Encounter: Payer: Self-pay | Admitting: Family Medicine

## 2013-04-29 ENCOUNTER — Ambulatory Visit (INDEPENDENT_AMBULATORY_CARE_PROVIDER_SITE_OTHER): Payer: Medicaid Other | Admitting: Family Medicine

## 2013-04-29 VITALS — BP 115/81 | Temp 97.9°F | Wt 208.6 lb

## 2013-04-29 DIAGNOSIS — E119 Type 2 diabetes mellitus without complications: Secondary | ICD-10-CM | POA: Insufficient documentation

## 2013-04-29 DIAGNOSIS — Z713 Dietary counseling and surveillance: Secondary | ICD-10-CM | POA: Insufficient documentation

## 2013-04-29 DIAGNOSIS — Z23 Encounter for immunization: Secondary | ICD-10-CM

## 2013-04-29 DIAGNOSIS — O24919 Unspecified diabetes mellitus in pregnancy, unspecified trimester: Secondary | ICD-10-CM

## 2013-04-29 DIAGNOSIS — O24913 Unspecified diabetes mellitus in pregnancy, third trimester: Secondary | ICD-10-CM

## 2013-04-29 LAB — POCT URINALYSIS DIP (DEVICE)
Bilirubin Urine: NEGATIVE
Glucose, UA: NEGATIVE mg/dL
Nitrite: NEGATIVE
Specific Gravity, Urine: <=1.005 (ref 1.005–1.030)
Urobilinogen, UA: 0.2 mg/dL (ref 0.0–1.0)
pH: 5.5 (ref 5.0–8.0)

## 2013-04-29 LAB — HEMOGLOBIN A1C
Hgb A1c MFr Bld: 6.9 % — ABNORMAL HIGH (ref <5.7)
Mean Plasma Glucose: 151 mg/dL — ABNORMAL HIGH (ref <117)

## 2013-04-29 LAB — TSH: TSH: 3.51 u[IU]/mL (ref 0.350–4.500)

## 2013-04-29 MED ORDER — GLYBURIDE 5 MG PO TABS: 10.0000 mg | ORAL_TABLET | Freq: Two times a day (BID) | ORAL | Status: DC

## 2013-04-29 MED ORDER — TETANUS-DIPHTH-ACELL PERTUSSIS 5-2.5-18.5 LF-MCG/0.5 IM SUSP
0.5000 mL | Freq: Once | INTRAMUSCULAR | Status: AC
  Administered 2013-04-29: 0.5 mL via INTRAMUSCULAR

## 2013-04-29 NOTE — Progress Notes (Signed)
FBS 76-152 2hr pp 99-169, most out of range.  Not following diet as directed--reports exercise--to see Nutrition Will increase her Glyburide to 10 mg bid--if BS is still too high--may need insulin start--begin 2x/wk testing next visit. Needs baseline labs, TSH, retinal scan and fetal ECHO.

## 2013-04-29 NOTE — Patient Instructions (Signed)
Diabetes mellitus gestacional  (Gestational Diabetes Mellitus) La diabetes mellitus gestacional, ms comnmente conocida como diabetes gestacional es un tipo de diabetes que desarrollan algunas mujeres durante el embarazo. En la diabetes gestacional, el pncreas no produce suficiente insulina (una hormona), las clulas son menos sensibles a la insulina que se produce (resistencia a la insulina), o ambos.Normalmente, la insulina mueve los azcares de los alimentos a las clulas de los tejidos. Las clulas de los tejidos utilizan los azcares para obtener energa. La falta de insulina o la falta de una respuesta normal a la insulina hace que el exceso de azcar se acumule en la sangre en lugar de penetrar en las clulas de los tejidos. Como resultado, se desarrollan los niveles altos de azcar en la sangre (hiperglucemia). El efecto de los niveles altos de azcar (glucosa) puede causar muchas complicaciones.  FACTORES DE RIESGO Usted tiene mayor probabilidad de desarrollar diabetes gestacional si tiene antecedentes familiares de diabetes y tambin si tiene uno o ms de los siguientes factores de riesgo:   ndice de masa corporal superior a 30 (obesidad).  Embarazo previo con diabetes gestacional.  Mayor edad en el momento del embarazo. Si se mantienen los niveles de glucosa en sangre en un rango normal durante el embarazo, las mujeres pueden tener un embarazo saludable. Si no controla bien sus niveles de glucosa en sangre, pueden tener tener riesgos usted, su beb antes de nacer (feto), el trabajo de parto y el parto, o el beb recin nacido.  SNTOMAS  Si se presentan sntomas, stos son similares a los sntomas que normalmente experimentar durante el embarazo. Los sntomas de la diabetes gestacional son:   Aumento de la sed (polidipsia).  Aumento de ganas de orinar (poliuria).  Aumento de ganas de orinar durante la noche (nicturia).  Prdida de peso. Prdida de peso que puede ser muy  rpida.  Infecciones frecuentes y recurrentes.  Cansancio (fatiga).  Debilidad.  Cambios en la visin, como visin borrosa.  Olor a fruta en el aliento.  Dolor abdominal. DIAGNSTICO La diabetes se diagnostica cuando hay aumento de los niveles de glucosa en la sangre. El nivel de glucosa en la sangre puede controlarse en uno o ms de los siguientes anlisis de sangre:   Medicin de glucosa en sangre en ayunas. No deber comer durante al menos 8 horas antes de que se tome la muestra de sangre.  Anlisis al azar de glucosa en sangre. El nivel de glucosa en sangre se controla en cualquier momento del da sin importar el momento en que haya comido.  Pruebas de glucosa de sangre de hemoglobina A1c. Un anlisis de la hemoglobina A1c proporciona informacin sobre el control de la glucosa en la sangre durante los ltimos 3 meses.  Prueba oral de tolerancia a la glucosa (SOG). La glucosa en la sangre se mide despus de no haber comido (ayuno) durante 1-3 horas y despus de beber una bebida que contiene glucosa. Dado que las hormonas que causan la resistencia a la insulina son ms altas alrededor de las semanas 24-28 de embarazo, generalmente se realiza un SOG durante ese tiempo. Si tiene factores de riesgo para la diabetes gestacional, su mdico puede detectarla antes de las 24 semanas de embarazo. TRATAMIENTO   Usted tendr que tomar medicamentos para la diabetes o insulina diariamente para mantener los niveles de glucosa en sangre en el rango deseado.  Usted tendr que hacer coincidir la dosis de insulina con el ejercicio y la eleccin de alimentos saludables. El objetivo del tratamiento es   mantener el nivel de glucosa en sangre en 60-99 mg / dl antes de la comida (preprandial), a la hora de acostarse y durante la noche mientras dure su embarazo. El objetivo del tratamiento es mantener el mayor pico de azcar en sangre despus de la comida (glucosa postprandial) en 100-140 mg/dl.  INSTRUCCIONES  PARA EL CUIDADO EN EL HOGAR   Haga que su nivel de hemoglobina A1c sea verificado dos veces al ao.  Realice un control diario de glucosa en sangre segn las indicaciones de su mdico. Es comn realizar controles con frecuencia de la glucosa en sangre.  Supervise las cetonas en la orina cuando est enferma y segn las indicaciones de su mdico.  Tome su medicamento para la diabetes y la insulina segn las indicaciones de su mdico para mantener el nivel de glucosa en sangre en su rango deseado.  Nunca se quede sin medicamentos para la diabetes o sin insulina. Es necesario recibirla todos los das.  Ajuste la insulina sobre la base de la ingesta de hidratos de carbono. Los hidratos de carbono pueden aumentar los niveles de glucosa en la sangre, pero deben incluirse en su dieta. Los hidratos de carbono aportan vitaminas, minerales y fibra que son una parte esencial de una dieta saludable. Los hidratos de carbono se encuentran en frutas, verduras, granos enteros, productos lcteos, legumbres y alimentos que contienen azcares aadidos.    Consuma alimentos saludables. Alterne 3 comidas con 3 colaciones.  Mantenga un aumento de peso saludable. El aumento del peso total vara de acuerdo con el ndice de masa corporal antes del embarazo ([facility]).  Lleve una tarjeta de alerta mdica o lleve una pulsera de alerta mdica.  Lleve consigo un refrigerio de 15 gramos de hidrato de carbonos en todo momento para controlar los niveles bajos de glucosa en sangre (hipoglucemia). Algunos ejemplos de aperitivos de 15 gramos de hidratos de carbono son:  Tabletas de glucosa, 3 o 4   Gel de glucosa, tubo de 15 gramos  Pasas de uva, 2 cucharadas (24 g)  Caramelos de goma, 6  Galletas de animales, 8  Jugo de fruta, soda regular, o leche baja en grasa, 4 onzas (120 ml)  Pastillas gomitas, 9    Reconocer la hipoglucemia. Durante el embarazo la hipoglucemia se produce cuando hay niveles de glucosa en  sangre de 60 mg/dl o menos. El riesgo de hipoglucemia aumenta durante el ayuno o saltarse las comidas, durante o despus del ejercicio intenso, y durante el sueo. Los sntomas de hipoglucemia son:  Temblores o sacudidas.  Disminucin de capacidad de concentracin.  Sudoracin.  Aumento en el ritmo cardaco  Dolor de cabeza.  Boca seca.  Hambre.  Irritabilidad.  Ansiedad.  Sueo agitado.  Alteracin del habla o de la coordinacin.  Confusin.  Tratar la hipoglucemia rpidamente. Si usted est alerta y puede tragar con seguridad, siga la regla de 15:15:  Tome de 15 a 20 gramos de glucosa de accin rpida o hidratos de carbono . Las opciones de accin rpida son un gel de glucosa, unas tabletas de glucosa, o 4 onzas (120 ml) de jugo de frutas, soda regular, o leche baja en grasa.  Compruebe su nivel de glucosa en sangre 15 minutos despus de tomar la glucosa.   Tome de 15 a 20 gramos ms de glucosa si al repetir el nivel de glucosa en la sangre todava es de 70 mg / dl o inferior.  Coma una comida o una colacin dentro de 1 hora una vez que los   niveles de glucosa en la sangre vuelven a la normalidad.  Est alerta a la poliuria y polidipsia, que son los primeros signos de hiperglucemia. El conocimiento temprano de la hiperglucemia permite un tratamiento oportuno. Controle la hiperglucemia segn le indic su mdico.  Haga actividad fsica por lo menos 30 minutos al da o como lo indique su mdico. Se recomienda diez minutos de actividad fsica cronometrados 30 minutos despus de cada comida para controlar los niveles de glucosa en sangre postprandial.  Ajuste su dosis de insulina y la ingesta de alimentos, segn sea necesario, si se inicia un nuevo ejercicio o deporte.  Siga su plan diario de enfermo en algn momento que no puede comer o beber como de costumbre.  Evite el tabaco y el alcohol.  Concurra regularmente a las visitas de control con el mdico.  Siga el consejo  de su mdico respecto a los controles prenatales y posteriores al parto (post-parto), a la planificacin de las comidas, al ejercicio, a los medicamentos, a las vitaminas, a los anlisis de sangre, a otras pruebas mdicas y fsicas.  Cuide diariamente la piel y los pies. Examine su piel y los pies diariamente para detectar cortes, moretones, enrojecimiento, problemas en las uas, sangrado, ampollas o llagas.  Cepllese los dientes y encas por lo menos dos veces al da y use hilo dental al menos una vez por da. Concurra regularmente a las visitas de control con el dentista.  Programe un examen de vista durante el primer trimestre de su embarazo o como lo indique su mdico.  Comparta su plan de control de diabetes en su trabajo o en la escuela.  Mantngase al da con las vacunas.  Aprenda a manejar el estrs.  Obtenga educacin continuada y ayuda para la diabetes cuando sea necesario. SOLICITE ATENCIN MDICA SI:   No puede comer alimentos o beber por ms de 6 horas.  Tiene nuseas o ha vomitado durante ms de 6 horas.  Tiene un nivel de glucosa en sangre de 200 mg/dl y cetonas en la orina.  Presenta algn cambio en el estado mental.  Tiene problemas de visin.  Sufre un dolor persistente de cabeza.  Tiene dolor o molestias en el abdomen.  Desarrolla una enfermedad grave adicional.  Tiene diarrea durante ms de 6 horas.  Ha estado enferma o ha tenido fiebre durante un par de das y no mejora. SOLICITE ATENCIN MDICA DE INMEDIATO SI:   Tiene dificultad para respirar.  Ya no siente los movimientos del beb.  Est sangrando o tiene flujo vaginal.  Comienza a tener contracciones o trabajo de parto prematuro. ASEGRESE DE QUE:  Comprende estas instrucciones.  Controlar su enfermedad.  Solicitar ayuda de inmediato si no mejora o si empeora. Document Released: 04/20/2005 Document Revised: 04/04/2012 [employer] Patient Information 2014 [employer].  Embarazo   Tercer trimestre  (Pregnancy - Third Trimester) El tercer trimestre del embarazo (los ltimos 3 meses) es el perodo en el cual tanto usted como su beb crecen con ms rapidez. El beb alcanza un largo de aproximadamente 50 cm. y pesa entre 2,700 y 4,500 kg. El beb gana ms tejido graso y est listo para la vida fuera del cuerpo de la madre. Mientras estn en el interior, los bebs tienen perodos de sueo y vigilia, succionan el pulgar y tienen hipo. Quizs sienta pequeas contracciones del tero. Este es el falso trabajo de parto. Tambin se las conoce como contracciones de Braxton-Hicks . Es como una prctica del parto. Los problemas ms habituales de esta   etapa del embarazo incluyen mayor dificultad para respirar, hinchazn de las manos y los pies por retencin de lquidos y la necesidad de orinar con ms frecuencia debido a que el tero y el beb presionan sobre la vejiga.  EXAMENES PRENATALES   Durante los exmenes prenatales, deber seguir realizndose anlisis de sangre. Estas pruebas se realizan para controlar su salud y la del beb. Los anlisis de sangre se realizan para conocer los niveles de algunos compuestos de la sangre (hemoglobina). La anemia (bajo nivel de hemoglobina) es frecuente durante el embarazo. Para prevenirla, se administran hierro y vitaminas. Tambin le tomarn nuevas anlisis para descartar diabetes. Podrn repetirle algunas de las pruebas que le hicieron previamente.  En cada visita le medirn el tamao del tero. Esto permite asegurar que el beb se desarrolla adecuadamente, segn la fecha del embarazo.  Le controlarn la presin arterial en cada visita prenatal. Esto es para asegurarse de que no sufre toxemia.  Le harn un anlisis de orina en cada visita prenatal, para descartar infecciones, diabetes y la presencia de protenas.  Tambin en cada visita controlarn su peso. Esto se realiza para asegurarse que aumenta de peso al ritmo indicado y que usted y su beb  evolucionan normalmente.  En algunas ocasiones se realiza una prueba de ultrasonido para confirmar el correcto desarrollo y evolucin del beb. Esta prueba se realiza con ondas sonoras inofensivas para el beb, de modo que el profesional pueda calcular ms precisamente la fecha del parto.  Analice con su mdico los analgsicos y la anestesia que recibir durante el trabajo de parto y el parto.  Comente la posibilidad de que necesite una cesrea y qu anestesia se recibir.  Informe a su mdico si sufre violencia familiar mental o fsica. A veces, se indica la prueba especializada sin estrs, la prueba de tolerancia a las contracciones y el perfil biofsico para asegurarse de que el beb no tiene problemas. El estudio del lquido amnitico que rodea al beb se llama amniocentesis. El lquido amnitico se obtiene introduciendo una aguja en el vientre (abdomen ). En ocasiones se lleva a cabo cerca del final del embarazo, si es necesario inducir a un parto. En este caso se realiza para asegurarse que los pulmones del beb estn lo suficientemente maduros como para que pueda vivir fuera del tero. Si los pulmones no han madurado y es peligroso que el beb nazca, se administrar a la madre una inyeccin de cortisona , 1 a 2 das antes del parto. . Esto ayuda a que los pulmones del beb maduren y sea ms seguro su nacimiento.  CAMBIOS QUE OCURREN EN EL TERCER TRIMESTRE DEL EMBARAZO  Su organismo atravesar numerosos cambios durante el embarazo. Estos pueden variar de una persona a otra. Converse con el profesional que la asiste acerca los cambios que usted note y que la preocupen.   Durante el ltimo trimestre probablemente sienta un aumento del apetito. Es normal tener "antojos" de ciertas comidas. Esto vara de una persona a otra y de un embarazo a otro.  Podrn aparecer las primeras estras en las caderas, abdomen y mamas. Estos son cambios normales del cuerpo durante el embarazo. No existen medicamentos  ni ejercicios que puedan prevenir estos cambios.  La constipacin puede tratarse con un laxante o agregando fibra a su dieta. Beber grandes cantidades de lquidos, tomar fibras en forma de vegetales, frutas y granos integrales es de gran ayuda.  Tambin es beneficioso practicar actividad fsica. Si ha sido una persona activa hasta el embarazo, podr   continuar con la mayora de las actividades durante el mismo. Si ha sido menos activa, puede ser beneficioso que comience con un programa de ejercicios, como realizar caminatas. Consulte con el profesional que la asiste antes de comenzar un programa de ejercicios.  Evite el consumo de cigarrillos, el alcohol, los medicamentos no recetados y las "drogas de la calle" durante el embarazo. Estas sustancias qumicas afectan la formacin y el desarrollo del beb. Evite estas sustancias durante todo el embarazo para asegurar el nacimiento de un beb sano.  Podr sentir dolor de espalda, tener vrices en las venas y hemorroides, o si ya los sufra, pueden empeorar.  Durante el tercer trimestre se cansar con ms facilidad, lo cual es normal.  Los movimientos del beb pueden ser ms fuertes y con ms frecuencia.  Puede que note dificultades para respirar normalmente.  El ombligo puede salir hacia afuera.  A veces sale una secrecin amarilla de las mamas, que se llama calostro.  Podr aparecer una secrecin mucosa con sangre. Esto suele ocurrir entre unos pocos das y una semana antes del parto. INSTRUCCIONES PARA EL CUIDADO EN EL HOGAR   Cumpla con las citas de control. Siga las indicaciones del mdico con respecto al uso de medicamentos, los ejercicios y la dieta.  Durante el embarazo debe obtener nutrientes para usted y para su beb. Consuma alimentos balanceados a intervalos regulares. Elija alimentos como carne, pescado, leche y otros productos lcteos descremados, vegetales, frutas, panes integrales y cereales. El mdico le informar cul es el  aumento de peso ideal.  Las relaciones sexuales pueden continuarse hasta casi el final del embarazo, si no se presentan otros problemas como prdida prematura (antes de tiempo) de lquido amnitico, hemorragia vaginal o dolor en el vientre (abdominal).  Realice actividad fsica todos los das, si no tiene restricciones. Consulte con el profesional que la asiste si no sabe con certeza si determinados ejercicios son seguros. El mayor aumento de peso se producir en los ltimos 2 trimestres del embarazo. El ejercicio ayuda a:  Controlar su peso.  Mantenerse en forma para el trabajo de parto y el parto .  Perder peso despus del parto.  Haga reposo con frecuencia, con las piernas elevadas, o segn lo necesite para evitar los calambres y el dolor de cintura.  Use un buen sostn o como los que se usan para hacer deportes para aliviar la sensibilidad de las mamas. Tambin puede serle til si lo usa mientras duerme. Si pierde calostro, podr utilizar apsitos en el sostn.  No utilice la baera con agua caliente, baos turcos y saunas.   Colquese el cinturn de seguridad cuando conduzca. Este la proteger a usted y al beb en caso de accidente.  Evite comer carne cruda y el contacto con los utensilios y desperdicios de los gatos. Estos elementos contienen grmenes que pueden causar defectos de nacimiento en el beb.  Es fcil perder algo de orina durante el embarazo. Apretar y fortalecer los msculos de la pelvis la ayudar con este problema. Practique detener la miccin cuando est en el bao. Estos son los mismos msculos que necesita fortalecer. Son tambin los mismos msculos que utiliza cuando trata de evitar despedir gases. Puede practicar apretando estos msculos diez veces, y repetir esto tres veces por da aproximadamente. Una vez que conozca qu msculos debe apretar, no realice estos ejercicios durante la miccin. Puede favorecerle una infeccin si la orina vuelve hacia atrs.  Pida ayuda  si tienen necesidades financieras, teraputicas o nutricionales. El profesional podr ayudarla   con respecto a estas necesidades, o derivarla a otros especialistas.  Haga una lista de nmeros telefnicos de emergencia y tngalos disponibles.  Planifique como obtener ayuda de familiares o amigos cuando regrese a casa desde el hospital.  Hacer un ensayo sobre la partida al hospital.  Tome clases prenatales con el padre para entender, practicar y hacer preguntas sobre el trabajo de parto y el alumbramiento.  Preparar la habitacin del beb / busque una guardera.  No viaje fuera de la ciudad a menos que sea absolutamente necesario y con el asesoramiento de su mdico.  Use slo zapatos de tacn bajo o sin tacn para tener mejor equilibrio y evitar cadas. USO DE MEDICAMENTOS Y CONSUMO DE DROGAS DURANTE EL EMBARAZO   Tome las vitaminas apropiadas para esta etapa tal como se le indic. Las vitaminas deben contener un miligramo de cido flico. Guarde todas las vitaminas fuera del alcance de los nios. La ingestin de slo un par de vitaminas o tabletas que contengan hierro pueden ocasionar la muerte en un beb o en un nio pequeo.  Evite el uso de todos los medicamentos, incluyendo hierbas, medicamentos de venta libre, sin receta o que no hayan sido sugeridos por su mdico. Slo tome medicamentos de venta libre o medicamentos recetados para el dolor, el malestar o fiebre como lo indique su mdico. No tome aspirina, ibuprofeno o naproxeno excepto que su mdico se lo indique.  Infrmele al profesional si consume alguna droga.  El alcohol se relaciona con ciertos defectos congnitos. Incluye el sndrome de alcoholismo fetal. Debe evitar absolutamente el consumo de alcohol, en cualquier forma. El fumar produce baja tasa de natalidad y bebs prematuros.  Las drogas ilegales o de la calle son muy perjudiciales para el beb. Estn absolutamente prohibidas. Un beb que nace de una madre adicta, ser  adicto al nacer. Ese beb tendr los mismos sntomas de abstinencia que un adulto. SOLICITE ATENCIN MDICA SI:  Tiene preguntas o preocupaciones relacionadas con el embarazo. Es mejor que llame para formular las preguntas si no puede esperar hasta la prxima visita, que sentirse preocupada por ellas.  SOLICITE ATENCIN MDICA DE INMEDIATO SI:   La temperatura oral le sube a ms de 38,9 C (102 F) o lo que su mdico le indique.  Tiene una prdida de lquido por la vagina (canal de parto). Si sospecha una ruptura de las membranas, tmese la temperatura y llame al profesional para informarlo sobre esto.  Observa unas pequeas manchas, una hemorragia vaginal o elimina cogulos. Notifique al profesional acerca de la cantidad y de cuntos apsitos est utilizando.  Presenta un olor desagradable en la secrecin vaginal y observa un cambio en el color, de transparente a blanco.  Ha vomitado durante ms de 24 horas.  Siente escalofros o le sube la fiebre.  Le falta el aire.  Siente ardor al orinar.  Baja o sube ms de 2 libras (900 g), o segn lo indicado por el profesional que la asiste.  Observa que sbitamente se le hinchan el rostro, las manos, los pies o las piernas.  Siente dolor en el vientre (abdominal). Las molestias en el ligamento redondo son una causa benigna frecuente de dolor abdominal durante el embarazo. El profesional que la asiste deber evaluarla.  Presenta dolor de cabeza intenso que no se alivia.  Tiene problemas visuales, visin doble o borrosa.  Si no siente los movimientos del beb durante ms de 1 hora. Si piensa que el beb no se mueve tanto como lo haca habitualmente, coma   algo que contenga azcar y recustese sobre el lado izquierdo durante una hora. El beb debe moverse al menos 4  5 veces por hora. Comunquese inmediatamente si el beb se mueve menos que lo indicado.  Se cae, se ve involucrada en un accidente automovilstico o sufre algn tipo de  traumatismo.  En su hogar hay violencia mental o fsica. Document Released: 04/20/2005 Document Revised: 04/04/2012 [employer] Patient Information 2014 [employer].  Lactancia materna  (Breastfeeding)  El cambio hormonal durante el embarazo produce el desarrollo del tejido mamario y un aumento en el nmero y tamao de los conductos galactforos. La hormona prolactina permite que las protenas, los azcares y las grasas de la sangre produzcan la leche materna en las glndulas productoras de leche. La hormona progesterona impide que la leche materna sea liberada antes del nacimiento del beb. Despus del nacimiento del beb, su nivel de progesterona disminuye permitiendo que la leche materna sea liberada. Pensar en el beb, as como la succin o el llanto, pueden estimular la liberacin de leche de las glndulas productoras de leche.  La decisin de amamantar (lactar) es una de las mejores opciones que usted puede hacer para usted y su beb. La informacin que sigue da una breve resea de los beneficios, as como otras caractersticas importantes que debe saber sobre la lactancia materna.  LOS BENEFICIOS DE AMAMANTAR  Para el beb   La primera leche (calostro) ayuda al mejor funcionamiento del sistema digestivo del beb.   La leche tiene anticuerpos que provienen de la madre y que ayudan a prevenir las infecciones en el beb.   El beb tiene una menor incidencia de asma, alergias y del sndrome de muerte sbita del lactante (SMSL).   Los nutrientes de la leche materna son mejores para el beb que la leche maternizada.  La leche materna mejora el desarrollo cerebral del beb.   Su beb tendr menos gases, clicos y estreimiento.  Es menos probable que el beb desarrolle otras enfermedades, como obesidad infantil, asma o diabetes mellitus. Para usted   La lactancia materna favorece el desarrollo de un vnculo muy especial entre la madre y el beb.   Es ms conveniente, siempre  disponible, a la temperatura adecuada y econmica.   La lactancia materna ayuda a quemar caloras y a perder el peso ganado durante el embarazo.   Hace que el tero se contraiga ms rpidamente a su tamao normal y disminuye el sangrado despus del parto.   Las madres que amamantan tienen menos riesgo de desarrollar osteoporosis o cncer de mama o de ovario en el futuro.  FRECUENCIA DEL AMAMANTAMIENTO   Un beb sano, nacido a trmino, puede amamantarse con tanta frecuencia como cada hora, o espaciar las comidas cada tres horas. La frecuencia en la lactancia varan de un beb a otro.   Los recin nacidos deben ser alimentados por lo menos cada 2-3 horas durante el da y cada 4-5 horas durante la noche. Usted debe amamantarlo un mnimo de 8 tomas en un perodo de 24 horas.  Despierte al beb para amamantarlo si han pasado 3-4 horas desde la ltima comida.  Amamante cuando sienta la necesidad de reducir la plenitud de sus senos o cuando el beb muestre signos de hambre. Las seales de que el beb puede tener hambre son:  Aumenta su estado de alerta o vigilancia.  Se estira.  Mueve la cabeza de un lado a otro.  Mueve la cabeza y abre la boca cuando se le toca la mejilla o   la boca (reflejo de succin).  Aumenta las vocalizaciones, tales como sonidos de succin, relamerse los labios, arrullos, suspiros, o chirridos.  Mueve la mano hacia la boca.  Se chupa con ganas los dedos o las manos.  Agitacin.  Llanto intermitente.  Los signos de hambre extrema requerirn que lo calme y lo consuele antes de tratar de alimentarlo. Los signos de hambre extrema son:  Agitacin.  Llanto fuerte e intenso.  Gritos.  El amamantamiento frecuente la ayudar a producir ms leche y a prevenir problemas de dolor en los pezones e hinchazn de las mamas.  LACTANCIA MATERNA   Ya sea que se encuentre acostada o sentada, asegrese que el abdomen del beb est enfrente el suyo.   Sostenga la mama  con el pulgar por arriba y los otros 4 dedos por debajo del pezn. Asegrese que sus dedos se encuentren lejos del pezn y de la boca del beb.   Empuje suavemente los labios del beb con el pezn o con el dedo.   Cuando la boca del beb se abra lo suficiente, introduzca el pezn y la zona oscura que lo rodea (areola) tanto como le sea posible dentro de la boca.  Debe haber ms areola visible por arriba del labio superior que por debajo del labio inferior.  La lengua del beb debe estar entre la enca inferior y el seno.  Asegrese de que la boca del beb est en la posicin correcta alrededor del pezn (prendida). Los labios del beb deben crear un sello sobre su pecho.  Las seales de que el beb se ha prendido eficazmente al pezn son:  Tironea o succiona sin dolor.  Se escucha que traga entre las succiones.  No hace ruidos ni chasquidos.  Hay movimientos musculares por arriba y por delante de sus odos al succionar.  El beb debe succionar unos 2-3 minutos para que salga la leche. Permita que el nio se alimente en cada mama todo lo que desee. Alimente al beb hasta que se desprenda o se quede dormido en el primer pecho y luego ofrzcale el segundo pecho.  Las seales de que el beb est lleno y satisfecho son:  Disminuye gradualmente el nmero de succiones o no succiona.  Se queda dormido.  Extiende o relaja su cuerpo.  Retiene una pequea cantidad de leche en la boca.  Se desprende del pecho por s mismo.  Los signos de una lactancia materna eficaz son:  Los senos han aumentado la firmeza, el peso y el tamao antes de la alimentacin.  Son ms blandos despus de amamantar.  Un aumento del volumen de leche, y tambin el cambio de su consistencia y color se producen hacia el quinto da de lactancia materna.  La congestin mamaria se alivia al dar de mamar.  Los pezones no duelen, ni estn agrietados ni sangran.  De ser necesario, interrumpa la succin poniendo su  dedo en la esquina de la boca del beb y deslizando el dedo entre sus encas. A continuacin, retire la mama de su boca.  Es comn que los bebs regurgiten un poco despus de comer.  A menudo los bebs tragan aire al alimentarse. Esto puede hacer que se sienta molesto. Hacer eructar al beb al cambiar de pecho puede ser de ayuda.  Se recomiendan suplementos de vitamina D para los bebs que reciben slo leche materna.  Evite el uso del chupete durante las primeras 4 a 6 semanas de vida.  Evite la alimentacin suplementaria con agua, frmula o jugo en   lugar de la leche materna. La leche materna es todo el alimento que el beb necesita. No es necesario que el nio ingiera agua o preparados de bibern. Sus pechos producirn ms leche si se evita la alimentacin suplementaria durante las primeras semanas. COMO SABER SI EL BEB OBTIENE LA SUFICIENTE LECHE MATERNA  Preguntarse si el beb obtiene la cantidad suficiente de leche es una preocupacin frecuente entre las madres. Puede asegurarse que el beb tiene la leche suficiente si:   El beb succiona activamente y usted escucha que traga.   El beb parece estar relajado y satisfecho despus de mamar.   El nio se alimenta al menos 8 a 12 veces en 24 horas.  Durante los primeros 3 a 5 das de vida:  Moja 3-5 paales en 24 horas. La materia fecal debe ser blanda y amarillenta.  Tiene al menos 3 a 4 deposiciones en 24 horas. La materia fecal debe ser blanda y amarillenta.  A los 5-7 das de vida, el beb debe tener al menos 3-6 deposiciones en 24 horas. La materia fecal debe ser grumosa y amarilla a los 5 das de vida.  Su beb tiene una prdida de peso menor a 7al 10% durante los primeros 3 das de vida.  El beb no pierde peso despus de 3-7 das de vida.  El beb debe aumentar 4 a 6 libras (120 a 170 gr.) por semana despus de los 4 das de vida.  Aumenta de peso a los 5 das de vida y vuelve al peso del nacimiento dentro de las 2  semanas. CONGESTIN MAMARIA  Durante la primera semana despus del parto, usted puede experimentar hinchazn en las mamas (congestin mamaria). Al estar congestionadas, las mamas se sienten pesadas, calientes o sensibles al tacto. El pico de la congestin ocurre a las 24 -48 horas despus del parto.   La congestin puede disminuirse:  Continuando con la lactancia materna.  Aumentando la frecuencia.  Tomando duchas calientes o aplicando calor hmedo en los senos antes de cada comida. Esto aumenta la circulacin y ayuda a que la leche fluya.   Masajeando suavemente el pecho antes y durante las comidas. Con las yemas de los dedos, masajee la pared del pecho hacia el pezn en un movimiento circular.   Asegurarse de que el beb vaca al menos uno de sus pechos en cada alimentacin. Tambin ayuda si comienza la siguiente toma en el otro seno.   Extraiga manualmente o con un sacaleches las mamas para vaciar los pechos si el beb tiene sueo o no se aliment bien. Tambin puede extraer la leche cuando vuelva a trabajar o si siente que se estn congestionando las mamas.  Asegrese de que el beb se prende y est bien colocado durante la lactancia. Si sigue estas indicaciones, la congestin debe mejorar en 24 a 48 horas. Si an tiene dificultades, consulte a su asesor en lactancia.  CUDESE USTED MISMA  Cuide sus mamas.   Bese o dchese diariamente.   Evite usar jabn en los pezones.   Use un sostn de soporte Evite el uso de sostenes con aro.  Seque al aire sus pezones durante 3-4 minutos despus de cada comida.   Utilice slo apsitos de algodn en el sostn para absorber las prdidas de leche. La prdida de un poco de leche materna entre las comidas es normal.   Use solamente lanolina pura en sus pezones despus de amamantar. Usted no tiene que lavarla antes de alimentar al beb. Otra opcin es sacarse unas   gotas de leche y masajear suavemente los pezones.  Continuar con los  autocontroles de la mama. Cudese.   Consuma alimentos saludables. Alterne 3 comidas con 3 colaciones.  Evite los alimentos que usted nota que perjudican al beb.  Beba leche, jugos de fruta y agua para satisfacer su sed (aproximadamente 8 vasos al da).   Descanse con frecuencia, reljese y tome sus vitaminas prenatales para evitar la fatiga, el estrs y la anemia.  Evite masticar y fumar tabaco.  Evite el consumo de alcohol y drogas.  Tome medicamentos de venta libre y recetados tal como le indic su mdico o farmacutico. Siempre debe consultar con su mdico o farmacutico antes de tomar cualquier medicamento, vitamina o suplemento de hierbas.  Sepa que durante la lactancia puede quedar embarazada. Si lo desea, hable con su mdico acerca de la planificacin familiar y los mtodos anticonceptivos seguros que puede utilizar durante la lactancia. SOLICITE ATENCIN MDICA SI:   Usted siente que quiere dejar de amamantar o se siente frustrada con la lactancia.  Siente dolor en los senos o en los pezones.  Sus pezones estn agrietados o sangran.  Sus pechos estn irritados, sensibles o calientes.  Tiene un rea hinchada en cualquiera de los senos.  Siente escalofros o fiebre.  Tiene nuseas o vmitos.  Observa un drenaje en los pezones.  Sus mamas no se llenan antes de amamantarlo al 5to da despus del parto.  Se siente triste y deprimida.  El nio est demasiado somnoliento como para comer.  El nio tiene problemas para respirar.   Moja menos de 3 paales en 24 horas.  Mueve el intestino menos de 3 veces en 24 horas.  La piel del beb o la parte blanca de sus ojos est ms amarilla.   El beb no ha aumentado de peso a los 5 das de vida. ASEGRESE DE QUE:   Comprende estas instrucciones.  Controlar su enfermedad.  Solicitar ayuda de inmediato si no mejora o si empeora. Document Released: 07/11/2005 Document Revised: 04/04/2012 Exit[employer] Patient  Information 2014 [employer].  

## 2013-04-29 NOTE — Progress Notes (Signed)
DIABETES: Reviewed patient diet. Is eating fruit. I encouraged patient to continue eating as noted below. Discontinue Fruit. She denies eating; beans, rice, 24hr recall: Breakfast: Fish & Brocolli Lunch: Chicken and Vegetables Dinner: Chicken Soup with vegetables Drink: water and milk 1C

## 2013-04-29 NOTE — Progress Notes (Signed)
Fetal Echo scheduled with Dr. Elizebeth Brooking on 05/20/13 at 1030 am. Contact information provided along with the tel. # for financial assistance. [facility] appointment for a retinal scan scheduled 04/30/13 at 9 am.

## 2013-04-29 NOTE — Progress Notes (Signed)
Pulse: 92

## 2013-04-30 ENCOUNTER — Ambulatory Visit (INDEPENDENT_AMBULATORY_CARE_PROVIDER_SITE_OTHER): Payer: Medicaid Other | Admitting: Home Health Services

## 2013-04-30 DIAGNOSIS — E119 Type 2 diabetes mellitus without complications: Secondary | ICD-10-CM

## 2013-04-30 NOTE — Progress Notes (Signed)
DIABETES Pt came in to have a retinal scan per diabetic care.   Image was taken and submitted to [facility]arg for reading.    Results will be available in 1-2 weeks.  Results will be given to PCP for review and to contact patient.  [name]  

## 2013-05-03 ENCOUNTER — Encounter: Payer: Self-pay | Admitting: *Deleted

## 2013-05-06 ENCOUNTER — Telehealth: Payer: Self-pay | Admitting: Obstetrics & Gynecology

## 2013-05-06 ENCOUNTER — Ambulatory Visit (HOSPITAL_COMMUNITY)
Admission: RE | Admit: 2013-05-06 | Discharge: 2013-05-06 | Disposition: A | Discharge status: Home / Self Care | Payer: Medicaid Other | Source: Ambulatory Visit | Attending: Advanced Practice Midwife | Admitting: Advanced Practice Midwife

## 2013-05-06 ENCOUNTER — Ambulatory Visit (HOSPITAL_COMMUNITY): Admission: RE | Admit: 2013-05-06 | Payer: Medicaid Other | Source: Ambulatory Visit

## 2013-05-06 ENCOUNTER — Encounter: Payer: Medicaid Other | Admitting: *Deleted

## 2013-05-06 ENCOUNTER — Ambulatory Visit (INDEPENDENT_AMBULATORY_CARE_PROVIDER_SITE_OTHER): Payer: Medicaid Other | Admitting: Obstetrics and Gynecology

## 2013-05-06 VITALS — BP 126/88 | Temp 98.6°F | Wt 207.9 lb

## 2013-05-06 DIAGNOSIS — O139 Gestational [pregnancy-induced] hypertension without significant proteinuria, unspecified trimester: Secondary | ICD-10-CM | POA: Insufficient documentation

## 2013-05-06 DIAGNOSIS — E669 Obesity, unspecified: Secondary | ICD-10-CM | POA: Insufficient documentation

## 2013-05-06 DIAGNOSIS — O09529 Supervision of elderly multigravida, unspecified trimester: Secondary | ICD-10-CM

## 2013-05-06 DIAGNOSIS — O24919 Unspecified diabetes mellitus in pregnancy, unspecified trimester: Secondary | ICD-10-CM

## 2013-05-06 DIAGNOSIS — O24913 Unspecified diabetes mellitus in pregnancy, third trimester: Secondary | ICD-10-CM

## 2013-05-06 DIAGNOSIS — I10 Essential (primary) hypertension: Secondary | ICD-10-CM

## 2013-05-06 DIAGNOSIS — O09523 Supervision of elderly multigravida, third trimester: Secondary | ICD-10-CM

## 2013-05-06 DIAGNOSIS — O10019 Pre-existing essential hypertension complicating pregnancy, unspecified trimester: Secondary | ICD-10-CM

## 2013-05-06 LAB — POCT URINALYSIS DIP (DEVICE)
Glucose, UA: NEGATIVE mg/dL
Hgb urine dipstick: NEGATIVE
Nitrite: NEGATIVE
Urobilinogen, UA: 0.2 mg/dL (ref 0.0–1.0)

## 2013-05-06 MED ORDER — GLYBURIDE 5 MG PO TABS: 10.0000 mg | ORAL_TABLET | Freq: Two times a day (BID) | ORAL | Status: DC

## 2013-05-06 NOTE — Telephone Encounter (Signed)
Called patient with Interpreter Alex to inform patient that her appointment was at 7:30. Alex left a message telling the patient to call us when she got this message.

## 2013-05-06 NOTE — Progress Notes (Signed)
Out of glyburide x 3 days: printed Rx per request. Prior to then, [id number], PP not checking qd but all high 120-142. Still eating fruit. C/W Dr. Macon Large who recommends starting insulin. See DM educator. Fetal echo and opthalmology appts are scheduled. [place] today.

## 2013-05-06 NOTE — Progress Notes (Signed)
DIABETES:  Patient comes for GDM f/u along with interpreter. After review of her glucose readings I consulted patient on her medication regimen. She ran out of her glyburide due to lack of funds. She has the money today to get her medication filled. I have consulted to Deirdre and Dr. Macon Large and feel that it would be best to have patient take medication consistently for the next week and return for further evaluation of readings. Dr. Macon Large calculated weight based insulin dosage to be NPH 16 Breakfast and 16 at bedtime. Regular 20units at breakfast, lunch and dinner. We will again reevaluate this next week. Patient understand that insulin may be in her future.

## 2013-05-06 NOTE — Patient Instructions (Signed)
Gua de planeamiento de la alimentacin para diabticos (Diabetes Meal Planning Guide) La gua de planeamiento de alimentacin para diabticos es una herramienta para ayudarlo a planear sus comidas y colaciones. Es importante para las personas con diabetes controlar sus niveles de azcar. Elegir los alimentos correctos y las cantidades adecuadas durante el da le ayudar a controlar el azcar en sangre. Comer bien puede incluso ayudarlo a mejorar la presin sangunea y alcanzar o mantener un peso saludable. CUENTE LOS HIDRATOS DE CARBONO CON FACILIDAD Cuando consume hidratos de carbono, stos se transforman en azcar (glucosa). Esto a su vez aumenta el nivel de azcar en sangre. El conteo de carbohidratos puede ayudarlo a controlar este nivel para que se sienta mejor. Al planear sus alimentos con el conteo de carbohidratos, podr tener ms flexibilidad en lo que come y equilibrar la medicacin con el consumo de alimentos. El conteo de carbohidratos significa simplemente sumar la cantidad total de gramos de carbohidratos a sus comidas o colaciones. Trate de consumir la misma cantidad en cada comida. A continuacin encontrar una lista de 1 porcin o 15 gr. de carbohidratos. A continuacin se enumeran. Pregunte al mdico cuntos gramos de carbohidratos necesita comer en cada comida o colacin. Almidones y granos  1 rebanada de pan.   bollo ingls o bollo para hamburguesa o hotdog.   taza de cereal fro (sin azcar).   taza de pasta o arroz cocido.   taza de vegetales que contengan almidn (maz, papas, arvejas, porotos, calabaza).  1 omelette (6 pulgadas).   bollo.  1 waffle o panqueque (del tamao de un CD).   taza de cereales cocidos.  4 a 6 galletas saldas pequeas. *Se recomienda el consumo de granos enteros. Frutas  1 taza de frutos rojos, meln, papaya o anan sin azcar.  1 fruta fresca pequea.   banana o mango.   taza de jugo de frutas (4 onzas sin endulzar).    taza de fruta envasada en jugo natural o agua.  2 cucharadas de frutas secas.  12-15 uvas o cerezas. Leche y yogurt  1 taza de leche descremada o al 1%.  1 taza de leche de soja.  6 onzas de yogurt descremado con edulcorante sin azcar.  6 onzas de yogur descremado de soja.  6 onzas de yogur natural. Vegetales  1 taza de vegetales crudos o  de vegetales cocidos se considera cero carbohidratos o una comida "libre".  Si come 3 o ms porciones en una comida, cuntelas como 1 porcin de carbohidratos. Otros carbohidratos   onzas de chips o pretzels.   taza de helado de crema o yogur helado.   taza de helado de agua.  5 cm de torta no congelada.  1 cucharada de miel, azcar, mermelada, jalea o almbar.  2 galletitas dulces pequeas.  3 cuadrados de crackers de graham.  3 tazas de palomitas de maz.  6 crackers.  1 taza de caldo.  Cuente 1 taza de guisado u otra mezcla de alimentos como 2 porciones de carbohidratos.  Los alimentos con menos de 20 caloras por porcin deben contarse como cero carbohidratos o alimento "libre". Si lo desea compre un libro o software de computacin que enumere la cantidad de gramos de carbohidratos de los diferentes alimentos. Adems, el panel nutricional en las etiquetas de los productos que consume es una buena fuente de informacin. Le indicar el tamao de la porcin y la cantidad total de carbohidratos que consumir por cada una. Divida este nmero por 15 para obtener el nmero   de conteo de carbohidratos por porcin. Recuerde: cada porcin son 15 gramos de carbohidratos. PORCIONES La medicin de los alimentos y el tamao de las porciones lo ayudarn a controlar la cantidad exacta de comida que debe ingerir. La lista que sigue le mostrar el tamao de algunas porciones comunes.   1 onza.................4 dados apilados.  3 onzas...............Un mazo de cartas.  1 cucharadita.....La punta de un dedo pequeo.  1  cucharada........Un dedo.  2 cucharadas......Una pelota de golf.   taza...............La mitad de un puo.  1 taza................Un puo. EJEMPLO DE PLAN DE ALIMENTACIN PARA DIABTICOS: A continuacin se muestra un ejemplo de plan de alimentacin que incluye comidas de los grupos de granos y fculas, vegetales, frutas y carnes. Un nutricionista podr confeccionarle un plan individualizado para cubrir sus necesidades calricas y decirle el nmero de porciones que necesita de cada grupo. Sin embargo, podra intercambiar los alimentos que contengan carbohidratos (lcteos, cereales y frutas). Controlar la cantidad total de carbohidratos en los alimentos o colaciones es ms importante que asegurarse de incluir todos los grupos alimenticios cada vez que come.  El siguiente plan de alimentacin es un ejemplo de una dieta de 2000 caloras mediante el conteo de carbohidratos. Este plan contiene 17 porciones de carbohidratos. Desayuno  1 taza de avena (2 porciones de carbohidratos).   taza de yogur light(1 porcin de carbohidratos).  1 taza de arndanos (1 porcin de carbohidratos).   taza de almendras. Colacin  1 manzana grande (2 porciones de carbohidratos).  1 palito de queso bajo en grasa. Almuerzo  Ensalada de pechuga de pollo.  1 taza de espinacas.   taza de tomates cortados.  2 oz (60 gr) de pechuga de pollo en rebanadas.  2 cucharadas de aderezo italiano bajo en contenido graso.  12 galletas integrales (2 porciones de carbohidratos).  12 a 15 uvas (1 porcin de carbohidratos).  1 taza de leche descremada (1porcin de carbohidratos). Colacin  1 taza de zanahorias.   taza de pur de garbanzos (1 porcin de carbohidratos). Cena  3 oz (80 gr) de salmn a la parrilla.  1 taza de arroz integral (3 porciones de carbohidratos). Colacin  1  taza de brcoli al vapor (1 porcin de carbohidrato) con una cucharadita de aceite de oliva y jugo de limn.  1 taza de  budn light (2 porciones de carbohidratos). HOJA DE PLANEAMIENTO DE LA ALIMENTACIN: El dietista podr utilizar esta hoja para ayudarlo a decidir cuntas porciones y qu tipos de alimentos son los adecuados para usted.  DESAYUNO Grupo de alimentos y porciones / Alimento elegido Granos/Fculas_________________________________________________ Lcteos________________________________________________________ Vegetales ______________________________________________________ Fruta __________________________________________________________ Carnes _________________________________________________________ Grasas _________________________________________________________ ALMUERZO Grupo de alimentos y porciones / Alimento elegido Granos/Fculas___________________________________________________ Lcteos_________________________________________________________ Fruta ___________________________________________________________ Carnes __________________________________________________________ Grasas __________________________________________________________ CENA Grupo de alimentos y porciones / Alimento elegido Granos/Fculas___________________________________________________ Lcteos_________________________________________________________ Fruta ___________________________________________________________ Carnes __________________________________________________________ Grasas __________________________________________________________ COLACIN Grupo de alimentos y porciones / Alimento elegido Granos/Fculas_________________________________________________ Lcteos________________________________________________________ Vegetales ______________________________________________________ Fruta _________________________________________________________ Carnes ________________________________________________________ Grasas ________________________________________________________ TOTALES  DIARIOS Fculas_______________________________________________________ Vegetales _____________________________________________________ Fruta ________________________________________________________ Lcteos_______________________________________________________ Carnes________________________________________________________ Grasas ________________________________________________________ Document Released: 10/18/2007 Document Revised: 10/03/2011 Exit[employer] Patient Information 2014 [employer].  

## 2013-05-06 NOTE — Progress Notes (Signed)
Pulse- 82  Pain-lower back

## 2013-05-07 ENCOUNTER — Encounter: Payer: Self-pay | Admitting: Family Medicine

## 2013-05-07 DIAGNOSIS — O10019 Pre-existing essential hypertension complicating pregnancy, unspecified trimester: Secondary | ICD-10-CM | POA: Insufficient documentation

## 2013-05-07 DIAGNOSIS — R945 Abnormal results of liver function studies: Secondary | ICD-10-CM | POA: Insufficient documentation

## 2013-05-07 DIAGNOSIS — O10013 Pre-existing essential hypertension complicating pregnancy, third trimester: Secondary | ICD-10-CM

## 2013-05-07 LAB — COMPREHENSIVE METABOLIC PANEL
ALT: 72 U/L — ABNORMAL HIGH (ref 0–35)
AST: 38 U/L — ABNORMAL HIGH (ref 0–37)
BUN: 9 mg/dL (ref 6–23)
Creat: 0.56 mg/dL (ref 0.50–1.10)
Potassium: 4 mEq/L (ref 3.5–5.3)
Total Bilirubin: 0.6 mg/dL (ref 0.3–1.2)

## 2013-05-08 ENCOUNTER — Encounter: Payer: Self-pay | Admitting: Family Medicine

## 2013-05-08 LAB — PROTEIN, URINE, 24 HOUR: Protein, Urine: <3 mg/dL

## 2013-05-08 LAB — CREATININE CLEARANCE, URINE, 24 HOUR: Creatinine Clearance: 153 mL/min — ABNORMAL HIGH (ref 75–115)

## 2013-05-09 ENCOUNTER — Ambulatory Visit (INDEPENDENT_AMBULATORY_CARE_PROVIDER_SITE_OTHER): Payer: Medicaid Other | Admitting: *Deleted

## 2013-05-09 VITALS — BP 124/77

## 2013-05-09 DIAGNOSIS — O10013 Pre-existing essential hypertension complicating pregnancy, third trimester: Secondary | ICD-10-CM

## 2013-05-09 DIAGNOSIS — O24919 Unspecified diabetes mellitus in pregnancy, unspecified trimester: Secondary | ICD-10-CM

## 2013-05-09 DIAGNOSIS — O10019 Pre-existing essential hypertension complicating pregnancy, unspecified trimester: Secondary | ICD-10-CM

## 2013-05-09 DIAGNOSIS — O24913 Unspecified diabetes mellitus in pregnancy, third trimester: Secondary | ICD-10-CM

## 2013-05-09 NOTE — Progress Notes (Signed)
P = 77 

## 2013-05-09 NOTE — Progress Notes (Signed)
NST performed today was reviewed and was found to be reactive.  Continue recommended antenatal testing and prenatal care.  

## 2013-05-13 ENCOUNTER — Encounter: Payer: Medicaid Other | Admitting: *Deleted

## 2013-05-13 ENCOUNTER — Ambulatory Visit (INDEPENDENT_AMBULATORY_CARE_PROVIDER_SITE_OTHER): Payer: Medicaid Other | Admitting: Obstetrics and Gynecology

## 2013-05-13 ENCOUNTER — Telehealth: Payer: Self-pay | Admitting: *Deleted

## 2013-05-13 ENCOUNTER — Encounter: Payer: Self-pay | Admitting: Obstetrics and Gynecology

## 2013-05-13 VITALS — BP 126/81 | Wt 211.0 lb

## 2013-05-13 DIAGNOSIS — O09529 Supervision of elderly multigravida, unspecified trimester: Secondary | ICD-10-CM

## 2013-05-13 DIAGNOSIS — O10019 Pre-existing essential hypertension complicating pregnancy, unspecified trimester: Secondary | ICD-10-CM

## 2013-05-13 DIAGNOSIS — O10013 Pre-existing essential hypertension complicating pregnancy, third trimester: Secondary | ICD-10-CM

## 2013-05-13 DIAGNOSIS — E119 Type 2 diabetes mellitus without complications: Secondary | ICD-10-CM

## 2013-05-13 DIAGNOSIS — O09523 Supervision of elderly multigravida, third trimester: Secondary | ICD-10-CM

## 2013-05-13 DIAGNOSIS — O24913 Unspecified diabetes mellitus in pregnancy, third trimester: Secondary | ICD-10-CM

## 2013-05-13 DIAGNOSIS — I10 Essential (primary) hypertension: Secondary | ICD-10-CM

## 2013-05-13 DIAGNOSIS — O24919 Unspecified diabetes mellitus in pregnancy, unspecified trimester: Secondary | ICD-10-CM

## 2013-05-13 LAB — POCT URINALYSIS DIP (DEVICE)
Bilirubin Urine: NEGATIVE
Glucose, UA: NEGATIVE mg/dL
Ketones, ur: NEGATIVE mg/dL
Specific Gravity, Urine: 1.02 (ref 1.005–1.030)
Urobilinogen, UA: 0.2 mg/dL (ref 0.0–1.0)
pH: 6.5 (ref 5.0–8.0)

## 2013-05-13 MED ORDER — INSULIN NPH (HUMAN) (ISOPHANE) 100 UNIT/ML ~~LOC~~ SUSP: SUBCUTANEOUS | Status: DC

## 2013-05-13 MED ORDER — "INSULIN SYRINGE-NEEDLE U-100 31G X 5/16"" 0.3 ML MISC": 1.0000 | Freq: Four times a day (QID) | Status: DC

## 2013-05-13 MED ORDER — INSULIN REGULAR HUMAN 100 UNIT/ML IJ SOLN: 10.0000 [IU] | Freq: Two times a day (BID) | INTRAMUSCULAR | Status: DC

## 2013-05-13 MED ORDER — INSULIN REGULAR HUMAN 100 UNIT/ML IJ SOLN: INTRAMUSCULAR | Status: DC

## 2013-05-13 MED ORDER — INSULIN NPH (HUMAN) (ISOPHANE) 100 UNIT/ML ~~LOC~~ SUSP: 10.0000 [IU] | Freq: Two times a day (BID) | SUBCUTANEOUS | Status: DC

## 2013-05-13 NOTE — Progress Notes (Signed)
CBG F 65-143 (4/6 abnormal) 2hr pp 85-192 (7/14 abnormal). Will start insulin regimen. NPH 16 units with breakfast and at bedtime. 20 units Regular with breakfast, dinner. Follow up in 1 week. FM/PTL precautions reviewed

## 2013-05-13 NOTE — Progress Notes (Signed)
P=85 pt reports pain today lower back rates 8 on scale 0-10/ describes dull ache in lower back continous

## 2013-05-13 NOTE — Progress Notes (Signed)
DIABETES: Patient presents for Insulin Start. Dosing calculation previous calculated. With patient being insulin naive Dr. Jolayne Panther has agreed to start at lower dosing. Of NPH 10units AM & HS, Regular 10units with Breakfast and Dinner. I will speak with patient on Thursday 05/16/13 to determine current glucose readings and increase insulin as needed. I will communicate through hospital interpreter. Pharmacy location changed at patient request. Original order per Dr. Jolayne Panther removed and new order placed to [facility]. The patient will come to [facility] 05/14/13 at 1:00 for insulin instruction. She will have obtained her insulin and supplies by that time.

## 2013-05-13 NOTE — Addendum Note (Signed)
Addended by: Rosendo Gros on: 05/13/2013 12:17 PM   Modules accepted: Orders, Medications

## 2013-05-13 NOTE — Progress Notes (Signed)
NST reviewed and reactive.  

## 2013-05-13 NOTE — Telephone Encounter (Signed)
A pharmacist from [facility] called to ask if they could fill patient prescription for syringes with a generic insulin syringe instead of bd that is covered by Medicaid because they are out of the bd. Approved prescription for generic insulin syringe.

## 2013-05-14 ENCOUNTER — Encounter: Payer: Medicaid Other | Admitting: *Deleted

## 2013-05-14 DIAGNOSIS — O24913 Unspecified diabetes mellitus in pregnancy, third trimester: Secondary | ICD-10-CM

## 2013-05-15 ENCOUNTER — Encounter: Payer: Self-pay | Admitting: *Deleted

## 2013-05-15 LAB — CYTOLOGY - PAP

## 2013-05-15 NOTE — Progress Notes (Signed)
Patient presents with interpreter for Insulin Start. She arrived with NPH and syringes. After contacting her pharmacy it was determined that the prescription for her Regular insulin was not received. I provided them with the order and it will be available for pick up in 45 minutes.  Insulin Instruction  Patient was seen on 05/14/13 for insulin instruction.  The following learning objectives were met by the patient during this visit:   Insulin Action of NPH & Regular insulins  Reviewed Hygiene and storage  Drawing up single doses  Rotation of Sites  Hypoglycemia- symptoms, causes , treatment choices  Record keeping and MD follow up  Hypoglycemia, causes, symptoms and treatment   Patient to start on insulin as Rx'd by MD. Patient was instructed to Inject NPH 10 units SQ in the morning with breakfast and NPH 10 units SQ at 9:00pm. Regular insulin 14 units to be injected in the morning with breakfast and Regular Insulin to be inject 14 units with dinner (5:00pm). Patient demonstrated understanding of insulin administration by teach back. At 1:30pm NPH 10 units was self administered. Patient instructed to begin Regular insulin at (dinner) 5:00pm this date. Not to take any more NPH until 05/15/13 in the AM. Pt verbalized understanding via translator. Written instructions were provided via translator.  Patient received the following handouts:  Insulin Instruction Handbook                                        I will call patient at home [phone]) on 05-16-13 after 3:00pm to review glucose readings and evaluate increase of insulin dose. Patient will be seen in follow-up at [facility], [place] Monday 05/20/13.

## 2013-05-16 ENCOUNTER — Telehealth: Payer: Self-pay | Admitting: *Deleted

## 2013-05-16 ENCOUNTER — Ambulatory Visit (INDEPENDENT_AMBULATORY_CARE_PROVIDER_SITE_OTHER): Payer: Medicaid Other | Admitting: *Deleted

## 2013-05-16 VITALS — BP 117/79

## 2013-05-16 DIAGNOSIS — O24913 Unspecified diabetes mellitus in pregnancy, third trimester: Secondary | ICD-10-CM

## 2013-05-16 DIAGNOSIS — O24919 Unspecified diabetes mellitus in pregnancy, unspecified trimester: Secondary | ICD-10-CM

## 2013-05-16 MED ORDER — INSULIN REGULAR HUMAN 100 UNIT/ML IJ SOLN: INTRAMUSCULAR | Status: DC

## 2013-05-16 MED ORDER — INSULIN NPH (HUMAN) (ISOPHANE) 100 UNIT/ML ~~LOC~~ SUSP: SUBCUTANEOUS | Status: DC

## 2013-05-16 NOTE — Progress Notes (Signed)
P=82 Pt reports pressure in back at all times, rates 8 on scale 0-10, pt lies down and pressure subsides./ Review of medications on initial part of visit and noted pt reported different information for NPH times and Regular insulin dosage.  Confirmed with interpreter what patient states regarding dosage and times for medication. Patient states she visited the Diabetic Educator on Tuesday (10/21) and Regular Insulin dosage was changed from 10 units twice per day to 14 units twice per day.  Note from Diabetic Educator noted stating change.  Review of patient's blood sugar show fsbs in am <70 with signs and symptoms of hypoglycemia.  Dr. Debroah Loop consulted and confirmed patient should stay with current dosage/times.  Pt is to take NPH 10 units @ 1000/2100.  Pt is to take Regular insulin 14 units in am/dinner.  D. Day RNC reviewed with patient signs/symptoms of hypoglycemia with interpreter and D. Day RNC educated patient on interventions patient is to take if blood sugar <70 with symptoms.  Medication record will be updated to represent current/correct dosage/times for insulin.

## 2013-05-16 NOTE — Addendum Note (Signed)
Addended by: Candelaria Stagers E on: 05/16/2013 01:05 PM   Modules accepted: Level of Service

## 2013-05-16 NOTE — Addendum Note (Signed)
Addended by: Candelaria Stagers E on: 05/16/2013 01:39 PM   Modules accepted: Orders

## 2013-05-16 NOTE — Telephone Encounter (Signed)
I had previously arranged to call patient this date to consult about her glucose readings related to new Insulin usage. Patient was seen in [facility] clinic today and glucose readings were reviewed. It was determined that dosing would remain the same. Patient continues to be followed closely in [facility]. Gilman Buttner RN, BSN, CDE

## 2013-05-20 ENCOUNTER — Ambulatory Visit (INDEPENDENT_AMBULATORY_CARE_PROVIDER_SITE_OTHER): Payer: Medicaid Other | Admitting: Obstetrics and Gynecology

## 2013-05-20 VITALS — BP 130/89 | Temp 97.1°F | Wt 213.4 lb

## 2013-05-20 DIAGNOSIS — E119 Type 2 diabetes mellitus without complications: Secondary | ICD-10-CM

## 2013-05-20 DIAGNOSIS — I10 Essential (primary) hypertension: Secondary | ICD-10-CM

## 2013-05-20 DIAGNOSIS — O24913 Unspecified diabetes mellitus in pregnancy, third trimester: Secondary | ICD-10-CM

## 2013-05-20 DIAGNOSIS — O09523 Supervision of elderly multigravida, third trimester: Secondary | ICD-10-CM

## 2013-05-20 DIAGNOSIS — O10019 Pre-existing essential hypertension complicating pregnancy, unspecified trimester: Secondary | ICD-10-CM

## 2013-05-20 DIAGNOSIS — O10013 Pre-existing essential hypertension complicating pregnancy, third trimester: Secondary | ICD-10-CM

## 2013-05-20 DIAGNOSIS — O24919 Unspecified diabetes mellitus in pregnancy, unspecified trimester: Secondary | ICD-10-CM

## 2013-05-20 LAB — POCT URINALYSIS DIP (DEVICE)
Glucose, UA: NEGATIVE mg/dL
Ketones, ur: NEGATIVE mg/dL
Protein, ur: NEGATIVE mg/dL
Specific Gravity, Urine: 1.01 (ref 1.005–1.030)
pH: 6 (ref 5.0–8.0)

## 2013-05-20 NOTE — Progress Notes (Signed)
Patient doing well without complaints. FM/PTL precautions reviewed. CBG fasting 68-100 most in 90's advised to consume a protein rich bedtime snack. 2 hr postprandial majority within range (highest 126). Patient for fetal echo today

## 2013-05-20 NOTE — Progress Notes (Signed)
Pulse- 79  Pain/pressure- lower back

## 2013-05-23 ENCOUNTER — Ambulatory Visit (INDEPENDENT_AMBULATORY_CARE_PROVIDER_SITE_OTHER): Payer: Medicaid Other | Admitting: General Practice

## 2013-05-23 VITALS — BP 124/74 | Wt 216.8 lb

## 2013-05-23 DIAGNOSIS — O24913 Unspecified diabetes mellitus in pregnancy, third trimester: Secondary | ICD-10-CM

## 2013-05-23 DIAGNOSIS — O24919 Unspecified diabetes mellitus in pregnancy, unspecified trimester: Secondary | ICD-10-CM

## 2013-05-23 NOTE — Progress Notes (Signed)
NST performed today was reviewed and was found to be reactive.  Continue recommended antenatal testing and prenatal care.  

## 2013-05-23 NOTE — Progress Notes (Signed)
Pulse- 81 Patient reports two small contractions yesterday

## 2013-05-27 ENCOUNTER — Ambulatory Visit (INDEPENDENT_AMBULATORY_CARE_PROVIDER_SITE_OTHER): Payer: Medicaid Other | Admitting: Family Medicine

## 2013-05-27 VITALS — BP 140/80 | Wt 216.4 lb

## 2013-05-27 DIAGNOSIS — O10013 Pre-existing essential hypertension complicating pregnancy, third trimester: Secondary | ICD-10-CM

## 2013-05-27 DIAGNOSIS — O24913 Unspecified diabetes mellitus in pregnancy, third trimester: Secondary | ICD-10-CM

## 2013-05-27 DIAGNOSIS — O09523 Supervision of elderly multigravida, third trimester: Secondary | ICD-10-CM

## 2013-05-27 DIAGNOSIS — O24919 Unspecified diabetes mellitus in pregnancy, unspecified trimester: Secondary | ICD-10-CM

## 2013-05-27 DIAGNOSIS — O09529 Supervision of elderly multigravida, unspecified trimester: Secondary | ICD-10-CM

## 2013-05-27 DIAGNOSIS — O10019 Pre-existing essential hypertension complicating pregnancy, unspecified trimester: Secondary | ICD-10-CM

## 2013-05-27 LAB — POCT URINALYSIS DIP (DEVICE)
Glucose, UA: NEGATIVE mg/dL
Hgb urine dipstick: NEGATIVE
Ketones, ur: NEGATIVE mg/dL
Protein, ur: NEGATIVE mg/dL
Specific Gravity, Urine: 1.01 (ref 1.005–1.030)
Urobilinogen, UA: 0.2 mg/dL (ref 0.0–1.0)

## 2013-05-27 NOTE — Progress Notes (Signed)
P-90 

## 2013-05-27 NOTE — Patient Instructions (Signed)

## 2013-05-27 NOTE — Progress Notes (Signed)
37 yo [id number] here for return OBV.   1) DM Fasting- 84-99- most in the 80s 14U regular and 10U of NPH in the AM 14U regular at dinner and 10U of NPH at bedtime  Post prandial all less than 130   2) cHTN - no headaches, blurry vision - not on medication   No ctx, VB, LOF. +FM.   O: see flowsheet  A/P 1) DM - at goal except for 1 value of fasting.  - cont insulin at current regiment for now - schedule for growth [place] @ 38 weeks - cont twice weekly NST/AFI/BPP  2)cHTN - stable, no symptoms - cont to monitor    3) breech today on [place] - cont to monitor for now - will re-evaluate on next [place]  F/u in 1 week

## 2013-05-27 NOTE — Progress Notes (Signed)
U/S scheduled 06/03/13 at 1030 am with MFM.

## 2013-05-30 ENCOUNTER — Other Ambulatory Visit: Payer: Medicaid Other

## 2013-06-02 NOTE — Progress Notes (Signed)
NST on 05/23/13 is reactive.

## 2013-06-03 ENCOUNTER — Encounter: Payer: Self-pay | Admitting: Family Medicine

## 2013-06-03 ENCOUNTER — Ambulatory Visit (INDEPENDENT_AMBULATORY_CARE_PROVIDER_SITE_OTHER): Payer: Self-pay | Admitting: Family Medicine

## 2013-06-03 ENCOUNTER — Ambulatory Visit (HOSPITAL_COMMUNITY): Payer: Medicaid Other

## 2013-06-03 VITALS — BP 126/76 | Wt 218.4 lb

## 2013-06-03 DIAGNOSIS — O99810 Abnormal glucose complicating pregnancy: Secondary | ICD-10-CM

## 2013-06-03 DIAGNOSIS — O24913 Unspecified diabetes mellitus in pregnancy, third trimester: Secondary | ICD-10-CM

## 2013-06-03 LAB — POCT URINALYSIS DIP (DEVICE)
Bilirubin Urine: NEGATIVE
Hgb urine dipstick: NEGATIVE
Ketones, ur: NEGATIVE mg/dL
Nitrite: POSITIVE — AB
Protein, ur: NEGATIVE mg/dL
Specific Gravity, Urine: 1.025 (ref 1.005–1.030)
pH: 6 (ref 5.0–8.0)

## 2013-06-03 MED ORDER — INSULIN REGULAR HUMAN 100 UNIT/ML IJ SOLN: INTRAMUSCULAR | Status: DC

## 2013-06-03 MED ORDER — INSULIN NPH (HUMAN) (ISOPHANE) 100 UNIT/ML ~~LOC~~ SUSP: SUBCUTANEOUS | Status: DC

## 2013-06-03 NOTE — Progress Notes (Signed)
Increase NPH to 12 units q am to correct daytime BS.

## 2013-06-03 NOTE — Progress Notes (Signed)
NST reviewed and reactive. GBS and GC/chlamydia today FBS 83-99 most in range 2 hr pp 109-131 U/s for growth 38 wks

## 2013-06-03 NOTE — Progress Notes (Signed)
Pulse- 78 Patient reports some abdominal/pelvic pain and irregular contractions

## 2013-06-03 NOTE — Patient Instructions (Signed)
Diabetes mellitus gestacional  (Gestational Diabetes Mellitus) La diabetes mellitus gestacional, ms comnmente conocida como diabetes gestacional es un tipo de diabetes que desarrollan algunas mujeres durante el embarazo. En la diabetes gestacional, el pncreas no produce suficiente insulina (una hormona), las clulas son menos sensibles a la insulina que se produce (resistencia a la insulina), o ambos.Normalmente, la insulina mueve los azcares de los alimentos a las clulas de los tejidos. Las clulas de los tejidos utilizan los azcares para obtener energa. La falta de insulina o la falta de una respuesta normal a la insulina hace que el exceso de azcar se acumule en la sangre en lugar de penetrar en las clulas de los tejidos. Como resultado, se desarrollan los niveles altos de azcar en la sangre (hiperglucemia). El efecto de los niveles altos de azcar (glucosa) puede causar muchas complicaciones.  FACTORES DE RIESGO Usted tiene mayor probabilidad de desarrollar diabetes gestacional si tiene antecedentes familiares de diabetes y tambin si tiene uno o ms de los siguientes factores de riesgo:   ndice de masa corporal superior a 30 (obesidad).  Embarazo previo con diabetes gestacional.  Mayor edad en el momento del embarazo. Si se mantienen los niveles de glucosa en sangre en un rango normal durante el embarazo, las mujeres pueden tener un embarazo saludable. Si no controla bien sus niveles de glucosa en sangre, pueden tener tener riesgos usted, su beb antes de nacer (feto), el trabajo de parto y el parto, o el beb recin nacido.  SNTOMAS  Si se presentan sntomas, stos son similares a los sntomas que normalmente experimentar durante el embarazo. Los sntomas de la diabetes gestacional son:   Aumento de la sed (polidipsia).  Aumento de ganas de orinar (poliuria).  Aumento de ganas de orinar durante la noche (nicturia).  Prdida de peso. Prdida de peso que puede ser muy  rpida.  Infecciones frecuentes y recurrentes.  Cansancio (fatiga).  Debilidad.  Cambios en la visin, como visin borrosa.  Olor a fruta en el aliento.  Dolor abdominal. DIAGNSTICO La diabetes se diagnostica cuando hay aumento de los niveles de glucosa en la sangre. El nivel de glucosa en la sangre puede controlarse en uno o ms de los siguientes anlisis de sangre:   Medicin de glucosa en sangre en ayunas. No deber comer durante al menos 8 horas antes de que se tome la muestra de sangre.  Anlisis al azar de glucosa en sangre. El nivel de glucosa en sangre se controla en cualquier momento del da sin importar el momento en que haya comido.  Pruebas de glucosa de sangre de hemoglobina A1c. Un anlisis de la hemoglobina A1c proporciona informacin sobre el control de la glucosa en la sangre durante los ltimos 3 meses.  Prueba oral de tolerancia a la glucosa (SOG). La glucosa en la sangre se mide despus de no haber comido (ayuno) durante 1-3 horas y despus de beber una bebida que contiene glucosa. Dado que las hormonas que causan la resistencia a la insulina son ms altas alrededor de las semanas 24-28 de embarazo, generalmente se realiza un SOG durante ese tiempo. Si tiene factores de riesgo para la diabetes gestacional, su mdico puede detectarla antes de las 24 semanas de embarazo. TRATAMIENTO   Usted tendr que tomar medicamentos para la diabetes o insulina diariamente para mantener los niveles de glucosa en sangre en el rango deseado.  Usted tendr que hacer coincidir la dosis de insulina con el ejercicio y la eleccin de alimentos saludables. El objetivo del tratamiento es   mantener el nivel de glucosa en sangre en 60-99 mg / dl antes de la comida (preprandial), a la hora de acostarse y durante la noche mientras dure su embarazo. El objetivo del tratamiento es mantener el mayor pico de azcar en sangre despus de la comida (glucosa postprandial) en 100-140 mg/dl.  INSTRUCCIONES  PARA EL CUIDADO EN EL HOGAR   Haga que su nivel de hemoglobina A1c sea verificado dos veces al ao.  Realice un control diario de glucosa en sangre segn las indicaciones de su mdico. Es comn realizar controles con frecuencia de la glucosa en sangre.  Supervise las cetonas en la orina cuando est enferma y segn las indicaciones de su mdico.  Tome su medicamento para la diabetes y la insulina segn las indicaciones de su mdico para mantener el nivel de glucosa en sangre en su rango deseado.  Nunca se quede sin medicamentos para la diabetes o sin insulina. Es necesario recibirla todos los das.  Ajuste la insulina sobre la base de la ingesta de hidratos de carbono. Los hidratos de carbono pueden aumentar los niveles de glucosa en la sangre, pero deben incluirse en su dieta. Los hidratos de carbono aportan vitaminas, minerales y fibra que son una parte esencial de una dieta saludable. Los hidratos de carbono se encuentran en frutas, verduras, granos enteros, productos lcteos, legumbres y alimentos que contienen azcares aadidos.    Consuma alimentos saludables. Alterne 3 comidas con 3 colaciones.  Mantenga un aumento de peso saludable. El aumento del peso total vara de acuerdo con el ndice de masa corporal antes del embarazo (IMC).  Lleve una tarjeta de alerta mdica o lleve una pulsera de alerta mdica.  Lleve consigo un refrigerio de 15 gramos de hidrato de carbonos en todo momento para controlar los niveles bajos de glucosa en sangre (hipoglucemia). Algunos ejemplos de aperitivos de 15 gramos de hidratos de carbono son:  Tabletas de glucosa, 3 o 4   Gel de glucosa, tubo de 15 gramos  Pasas de uva, 2 cucharadas (24 g)  Caramelos de goma, 6  Galletas de animales, 8  Jugo de fruta, soda regular, o leche baja en grasa, 4 onzas (120 ml)  Pastillas gomitas, 9    Reconocer la hipoglucemia. Durante el embarazo la hipoglucemia se produce cuando hay niveles de glucosa en  sangre de 60 mg/dl o menos. El riesgo de hipoglucemia aumenta durante el ayuno o saltarse las comidas, durante o despus del ejercicio intenso, y durante el sueo. Los sntomas de hipoglucemia son:  Temblores o sacudidas.  Disminucin de capacidad de concentracin.  Sudoracin.  Aumento en el ritmo cardaco  Dolor de cabeza.  Boca seca.  Hambre.  Irritabilidad.  Ansiedad.  Sueo agitado.  Alteracin del habla o de la coordinacin.  Confusin.  Tratar la hipoglucemia rpidamente. Si usted est alerta y puede tragar con seguridad, siga la regla de 15:15:  Tome de 15 a 20 gramos de glucosa de accin rpida o hidratos de carbono . Las opciones de accin rpida son un gel de glucosa, unas tabletas de glucosa, o 4 onzas (120 ml) de jugo de frutas, soda regular, o leche baja en grasa.  Compruebe su nivel de glucosa en sangre 15 minutos despus de tomar la glucosa.   Tome de 15 a 20 gramos ms de glucosa si al repetir el nivel de glucosa en la sangre todava es de 70 mg / dl o inferior.  Coma una comida o una colacin dentro de 1 hora una vez que los   niveles de glucosa en la sangre vuelven a la normalidad.  Est alerta a la poliuria y polidipsia, que son los primeros signos de hiperglucemia. El conocimiento temprano de la hiperglucemia permite un tratamiento oportuno. Controle la hiperglucemia segn le indic su mdico.  Haga actividad fsica por lo menos 30 minutos al da o como lo indique su mdico. Se recomienda diez minutos de actividad fsica cronometrados 30 minutos despus de cada comida para controlar los niveles de glucosa en sangre postprandial.  Ajuste su dosis de insulina y la ingesta de alimentos, segn sea necesario, si se inicia un nuevo ejercicio o deporte.  Siga su plan diario de enfermo en algn momento que no puede comer o beber como de costumbre.  Evite el tabaco y el alcohol.  Concurra regularmente a las visitas de control con el mdico.  Siga el consejo  de su mdico respecto a los controles prenatales y posteriores al parto (post-parto), a la planificacin de las comidas, al ejercicio, a los medicamentos, a las vitaminas, a los anlisis de sangre, a otras pruebas mdicas y fsicas.  Cuide diariamente la piel y los pies. Examine su piel y los pies diariamente para detectar cortes, moretones, enrojecimiento, problemas en las uas, sangrado, ampollas o llagas.  Cepllese los dientes y encas por lo menos dos veces al da y use hilo dental al menos una vez por da. Concurra regularmente a las visitas de control con el dentista.  Programe un examen de vista durante el primer trimestre de su embarazo o como lo indique su mdico.  Comparta su plan de control de diabetes en su trabajo o en la escuela.  Mantngase al da con las vacunas.  Aprenda a manejar el estrs.  Obtenga educacin continuada y ayuda para la diabetes cuando sea necesario. SOLICITE ATENCIN MDICA SI:   No puede comer alimentos o beber por ms de 6 horas.  Tiene nuseas o ha vomitado durante ms de 6 horas.  Tiene un nivel de glucosa en sangre de 200 mg/dl y cetonas en la orina.  Presenta algn cambio en el estado mental.  Tiene problemas de visin.  Sufre un dolor persistente de cabeza.  Tiene dolor o molestias en el abdomen.  Desarrolla una enfermedad grave adicional.  Tiene diarrea durante ms de 6 horas.  Ha estado enferma o ha tenido fiebre durante un par de das y no mejora. SOLICITE ATENCIN MDICA DE INMEDIATO SI:   Tiene dificultad para respirar.  Ya no siente los movimientos del beb.  Est sangrando o tiene flujo vaginal.  Comienza a tener contracciones o trabajo de parto prematuro. ASEGRESE DE QUE:  Comprende estas instrucciones.  Controlar su enfermedad.  Solicitar ayuda de inmediato si no mejora o si empeora. Document Released: 04/20/2005 Document Revised: 04/04/2012 [employer] Patient Information 2014 [employer].  Lactancia  materna  (Breastfeeding)  El cambio hormonal durante el embarazo produce el desarrollo del tejido mamario y un aumento en el nmero y tamao de los conductos galactforos. La hormona prolactina permite que las protenas, los azcares y las grasas de la sangre produzcan la leche materna en las glndulas productoras de leche. La hormona progesterona impide que la leche materna sea liberada antes del nacimiento del beb. Despus del nacimiento del beb, su nivel de progesterona disminuye permitiendo que la leche materna sea liberada. Pensar en el beb, as como la succin o el llanto, pueden estimular la liberacin de leche de las glndulas productoras de leche.  La decisin de amamantar (lactar) es una de las mejores   opciones que usted puede hacer para usted y su beb. La informacin que sigue da una breve resea de los beneficios, as como otras caractersticas importantes que debe saber sobre la lactancia materna.  LOS BENEFICIOS DE AMAMANTAR  Para el beb   La primera leche (calostro) ayuda al mejor funcionamiento del sistema digestivo del beb.   La leche tiene anticuerpos que provienen de la madre y que ayudan a prevenir las infecciones en el beb.   El beb tiene una menor incidencia de asma, alergias y del sndrome de muerte sbita del lactante (SMSL).   Los nutrientes de la leche materna son mejores para el beb que la leche maternizada.  La leche materna mejora el desarrollo cerebral del beb.   Su beb tendr menos gases, clicos y estreimiento.  Es menos probable que el beb desarrolle otras enfermedades, como obesidad infantil, asma o diabetes mellitus. Para usted   La lactancia materna favorece el desarrollo de un vnculo muy especial entre la madre y el beb.   Es ms conveniente, siempre disponible, a la temperatura adecuada y econmica.   La lactancia materna ayuda a quemar caloras y a perder el peso ganado durante el embarazo.   Hace que el tero se contraiga ms  rpidamente a su tamao normal y disminuye el sangrado despus del parto.   Las madres que amamantan tienen menos riesgo de desarrollar osteoporosis o cncer de mama o de ovario en el futuro.  FRECUENCIA DEL AMAMANTAMIENTO   Un beb sano, nacido a trmino, puede amamantarse con tanta frecuencia como cada hora, o espaciar las comidas cada tres horas. La frecuencia en la lactancia varan de un beb a otro.   Los recin nacidos deben ser alimentados por lo menos cada 2-3 horas durante el da y cada 4-5 horas durante la noche. Usted debe amamantarlo un mnimo de 8 tomas en un perodo de 24 horas.  Despierte al beb para amamantarlo si han pasado 3-4 horas desde la ltima comida.  Amamante cuando sienta la necesidad de reducir la plenitud de sus senos o cuando el beb muestre signos de hambre. Las seales de que el beb puede tener hambre son:  Aumenta su estado de alerta o vigilancia.  Se estira.  Mueve la cabeza de un lado a otro.  Mueve la cabeza y abre la boca cuando se le toca la mejilla o la boca (reflejo de succin).  Aumenta las vocalizaciones, tales como sonidos de succin, relamerse los labios, arrullos, suspiros, o chirridos.  Mueve la mano hacia la boca.  Se chupa con ganas los dedos o las manos.  Agitacin.  Llanto intermitente.  Los signos de hambre extrema requerirn que lo calme y lo consuele antes de tratar de alimentarlo. Los signos de hambre extrema son:  Agitacin.  Llanto fuerte e intenso.  Gritos.  El amamantamiento frecuente la ayudar a producir ms leche y a prevenir problemas de dolor en los pezones e hinchazn de las mamas.  LACTANCIA MATERNA   Ya sea que se encuentre acostada o sentada, asegrese que el abdomen del beb est enfrente el suyo.   Sostenga la mama con el pulgar por arriba y los otros 4 dedos por debajo del pezn. Asegrese que sus dedos se encuentren lejos del pezn y de la boca del beb.   Empuje suavemente los labios del beb  con el pezn o con el dedo.   Cuando la boca del beb se abra lo suficiente, introduzca el pezn y la zona oscura que lo rodea (areola)   tanto como le sea posible dentro de la boca.  Debe haber ms areola visible por arriba del labio superior que por debajo del labio inferior.  La lengua del beb debe estar entre la enca inferior y el seno.  Asegrese de que la boca del beb est en la posicin correcta alrededor del pezn (prendida). Los labios del beb deben crear un sello sobre su pecho.  Las seales de que el beb se ha prendido eficazmente al pezn son:  Tironea o succiona sin dolor.  Se escucha que traga entre las succiones.  No hace ruidos ni chasquidos.  Hay movimientos musculares por arriba y por delante de sus odos al succionar.  El beb debe succionar unos 2-3 minutos para que salga la leche. Permita que el nio se alimente en cada mama todo lo que desee. Alimente al beb hasta que se desprenda o se quede dormido en el primer pecho y luego ofrzcale el segundo pecho.  Las seales de que el beb est lleno y satisfecho son:  Disminuye gradualmente el nmero de succiones o no succiona.  Se queda dormido.  Extiende o relaja su cuerpo.  Retiene una pequea cantidad de leche en la boca.  Se desprende del pecho por s mismo.  Los signos de una lactancia materna eficaz son:  Los senos han aumentado la firmeza, el peso y el tamao antes de la alimentacin.  Son ms blandos despus de amamantar.  Un aumento del volumen de leche, y tambin el cambio de su consistencia y color se producen hacia el quinto da de lactancia materna.  La congestin mamaria se alivia al dar de mamar.  Los pezones no duelen, ni estn agrietados ni sangran.  De ser necesario, interrumpa la succin poniendo su dedo en la esquina de la boca del beb y deslizando el dedo entre sus encas. A continuacin, retire la mama de su boca.  Es comn que los bebs regurgiten un poco despus de  comer.  A menudo los bebs tragan aire al alimentarse. Esto puede hacer que se sienta molesto. Hacer eructar al beb al cambiar de pecho puede ser de ayuda.  Se recomiendan suplementos de vitamina D para los bebs que reciben slo leche materna.  Evite el uso del chupete durante las primeras 4 a 6 semanas de vida.  Evite la alimentacin suplementaria con agua, frmula o jugo en lugar de la leche materna. La leche materna es todo el alimento que el beb necesita. No es necesario que el nio ingiera agua o preparados de bibern. Sus pechos producirn ms leche si se evita la alimentacin suplementaria durante las primeras semanas. COMO SABER SI EL BEB OBTIENE LA SUFICIENTE LECHE MATERNA  Preguntarse si el beb obtiene la cantidad suficiente de leche es una preocupacin frecuente entre las madres. Puede asegurarse que el beb tiene la leche suficiente si:   El beb succiona activamente y usted escucha que traga.   El beb parece estar relajado y satisfecho despus de mamar.   El nio se alimenta al menos 8 a 12 veces en 24 horas.  Durante los primeros 3 a 5 das de vida:  Moja 3-5 paales en 24 horas. La materia fecal debe ser blanda y amarillenta.  Tiene al menos 3 a 4 deposiciones en 24 horas. La materia fecal debe ser blanda y amarillenta.  A los 5-7 das de vida, el beb debe tener al menos 3-6 deposiciones en 24 horas. La materia fecal debe ser grumosa y amarilla a los 5 das de vida.    Su beb tiene una prdida de peso menor a 7al 10% durante los primeros 3 das de vida.  El beb no pierde peso despus de 3-7 das de vida.  El beb debe aumentar 4 a 6 libras (120 a 170 gr.) por semana despus de los 4 das de vida.  Aumenta de peso a los 5 das de vida y vuelve al peso del nacimiento dentro de las 2 semanas. CONGESTIN MAMARIA  Durante la primera semana despus del parto, usted puede experimentar hinchazn en las mamas (congestin mamaria). Al estar congestionadas, las mamas se  sienten pesadas, calientes o sensibles al tacto. El pico de la congestin ocurre a las 24 -48 horas despus del parto.   La congestin puede disminuirse:  Continuando con la lactancia materna.  Aumentando la frecuencia.  Tomando duchas calientes o aplicando calor hmedo en los senos antes de cada comida. Esto aumenta la circulacin y ayuda a que la leche fluya.   Masajeando suavemente el pecho antes y durante las comidas. Con las yemas de los dedos, masajee la pared del pecho hacia el pezn en un movimiento circular.   Asegurarse de que el beb vaca al menos uno de sus pechos en cada alimentacin. Tambin ayuda si comienza la siguiente toma en el otro seno.   Extraiga manualmente o con un sacaleches las mamas para vaciar los pechos si el beb tiene sueo o no se aliment bien. Tambin puede extraer la leche cuando vuelva a trabajar o si siente que se estn congestionando las mamas.  Asegrese de que el beb se prende y est bien colocado durante la lactancia. Si sigue estas indicaciones, la congestin debe mejorar en 24 a 48 horas. Si an tiene dificultades, consulte a su asesor en lactancia.  CUDESE USTED MISMA  Cuide sus mamas.   Bese o dchese diariamente.   Evite usar jabn en los pezones.   Use un sostn de soporte Evite el uso de sostenes con aro.  Seque al aire sus pezones durante 3-4 minutos despus de cada comida.   Utilice slo apsitos de algodn en el sostn para absorber las prdidas de leche. La prdida de un poco de leche materna entre las comidas es normal.   Use solamente lanolina pura en sus pezones despus de amamantar. Usted no tiene que lavarla antes de alimentar al beb. Otra opcin es sacarse unas gotas de leche y masajear suavemente los pezones.  Continuar con los autocontroles de la mama. Cudese.   Consuma alimentos saludables. Alterne 3 comidas con 3 colaciones.  Evite los alimentos que usted nota que perjudican al beb.  Beba leche, jugos  de fruta y agua para satisfacer su sed (aproximadamente 8 vasos al da).   Descanse con frecuencia, reljese y tome sus vitaminas prenatales para evitar la fatiga, el estrs y la anemia.  Evite masticar y fumar tabaco.  Evite el consumo de alcohol y drogas.  Tome medicamentos de venta libre y recetados tal como le indic su mdico o farmacutico. Siempre debe consultar con su mdico o farmacutico antes de tomar cualquier medicamento, vitamina o suplemento de hierbas.  Sepa que durante la lactancia puede quedar embarazada. Si lo desea, hable con su mdico acerca de la planificacin familiar y los mtodos anticonceptivos seguros que puede utilizar durante la lactancia. SOLICITE ATENCIN MDICA SI:   Usted siente que quiere dejar de amamantar o se siente frustrada con la lactancia.  Siente dolor en los senos o en los pezones.  Sus pezones estn agrietados o sangran.  Sus   pechos estn irritados, sensibles o calientes.  Tiene un rea hinchada en cualquiera de los senos.  Siente escalofros o fiebre.  Tiene nuseas o vmitos.  Observa un drenaje en los pezones.  Sus mamas no se llenan antes de amamantarlo al 5to da despus del parto.  Se siente triste y deprimida.  El nio est demasiado somnoliento como para comer.  El nio tiene problemas para respirar.   Moja menos de 3 paales en 24 horas.  Mueve el intestino menos de 3 veces en 24 horas.  La piel del beb o la parte blanca de sus ojos est ms amarilla.   El beb no ha aumentado de peso a los 5 das de vida. ASEGRESE DE QUE:   Comprende estas instrucciones.  Controlar su enfermedad.  Solicitar ayuda de inmediato si no mejora o si empeora. Document Released: 07/11/2005 Document Revised: 04/04/2012 [employer] Patient Information 2014 [employer].  

## 2013-06-04 LAB — GC/CHLAMYDIA PROBE AMP
CT Probe RNA: NEGATIVE
GC Probe RNA: NEGATIVE

## 2013-06-05 LAB — CULTURE, OB URINE: Organism ID, Bacteria: NO GROWTH

## 2013-06-06 ENCOUNTER — Ambulatory Visit (INDEPENDENT_AMBULATORY_CARE_PROVIDER_SITE_OTHER): Payer: Medicaid Other | Admitting: *Deleted

## 2013-06-06 ENCOUNTER — Encounter: Payer: Self-pay | Admitting: Family Medicine

## 2013-06-06 VITALS — BP 127/88

## 2013-06-06 DIAGNOSIS — N898 Other specified noninflammatory disorders of vagina: Secondary | ICD-10-CM

## 2013-06-06 DIAGNOSIS — O24913 Unspecified diabetes mellitus in pregnancy, third trimester: Secondary | ICD-10-CM

## 2013-06-06 DIAGNOSIS — O10013 Pre-existing essential hypertension complicating pregnancy, third trimester: Secondary | ICD-10-CM

## 2013-06-06 DIAGNOSIS — O10019 Pre-existing essential hypertension complicating pregnancy, unspecified trimester: Secondary | ICD-10-CM

## 2013-06-06 DIAGNOSIS — O24919 Unspecified diabetes mellitus in pregnancy, unspecified trimester: Secondary | ICD-10-CM

## 2013-06-06 LAB — CULTURE, BETA STREP (GROUP B ONLY)

## 2013-06-06 LAB — OB RESULTS CONSOLE GBS: GBS: NEGATIVE

## 2013-06-06 NOTE — Addendum Note (Signed)
Addended by: Franchot Mimes on: 06/06/2013 04:35 PM   Modules accepted: Orders

## 2013-06-06 NOTE — Progress Notes (Signed)
P = 78    Pt reports having clear vag d/c w/odor, itching and irritation.   Wet prep sent to lab.

## 2013-06-07 LAB — WET PREP, GENITAL: Yeast Wet Prep HPF POC: NONE SEEN

## 2013-06-10 ENCOUNTER — Ambulatory Visit (INDEPENDENT_AMBULATORY_CARE_PROVIDER_SITE_OTHER): Payer: Medicaid Other | Admitting: Obstetrics & Gynecology

## 2013-06-10 VITALS — BP 132/89 | Temp 97.4°F | Wt 220.0 lb

## 2013-06-10 DIAGNOSIS — O24919 Unspecified diabetes mellitus in pregnancy, unspecified trimester: Secondary | ICD-10-CM

## 2013-06-10 DIAGNOSIS — O10013 Pre-existing essential hypertension complicating pregnancy, third trimester: Secondary | ICD-10-CM

## 2013-06-10 DIAGNOSIS — E119 Type 2 diabetes mellitus without complications: Secondary | ICD-10-CM

## 2013-06-10 DIAGNOSIS — I10 Essential (primary) hypertension: Secondary | ICD-10-CM

## 2013-06-10 DIAGNOSIS — O10019 Pre-existing essential hypertension complicating pregnancy, unspecified trimester: Secondary | ICD-10-CM

## 2013-06-10 DIAGNOSIS — O24913 Unspecified diabetes mellitus in pregnancy, third trimester: Secondary | ICD-10-CM

## 2013-06-10 DIAGNOSIS — O09529 Supervision of elderly multigravida, unspecified trimester: Secondary | ICD-10-CM

## 2013-06-10 LAB — POCT URINALYSIS DIP (DEVICE)
Hgb urine dipstick: NEGATIVE
Ketones, ur: NEGATIVE mg/dL
Protein, ur: NEGATIVE mg/dL
Specific Gravity, Urine: 1.02 (ref 1.005–1.030)
Urobilinogen, UA: 0.2 mg/dL (ref 0.0–1.0)
pH: 6 (ref 5.0–8.0)

## 2013-06-10 MED ORDER — FLUCONAZOLE 100 MG PO TABS: 150.0000 mg | ORAL_TABLET | Freq: Every day | ORAL | Status: DC

## 2013-06-10 MED ORDER — INSULIN REGULAR HUMAN 100 UNIT/ML IJ SOLN: INTRAMUSCULAR | Status: DC

## 2013-06-10 MED ORDER — INSULIN NPH (HUMAN) (ISOPHANE) 100 UNIT/ML ~~LOC~~ SUSP: SUBCUTANEOUS | Status: DC

## 2013-06-10 NOTE — Progress Notes (Signed)
Pt still having vaginal itching.  Wet prep negative last week.  Will treat with diflucan empirically.

## 2013-06-10 NOTE — Patient Instructions (Signed)
Levonorgestrel intrauterine device (IUD) Qu es este medicamento? El LEVONORGESTREL (DIU) es un dispositivo anticonceptivo (control de natalidad). El dispositivo se coloca dentro del tero por un profesional de la salud. Se utiliza para evitar el embarazo y tambin se puede utilizar para tratar el sangrado abundante que ocurre durante su perodo. Dependiendo del dispositivo, se puede utilizar por 3 a 5 aos. Este medicamento puede ser utilizado para otros usos; si tiene alguna pregunta consulte con su proveedor de atencin mdica o con su farmacutico. MARCAS COMERCIALES DISPONIBLES: Mirena, Skyla Qu le debo informar a mi profesional de la salud antes de tomar este medicamento? Necesita saber si usted presenta alguno de los siguientes problemas o situaciones: -exmen de Papanicolaou anormal -cncer de mama, cuello del tero o tero -diabetes -endometritis -si tiene una infeccin plvica o genital actual o en el pasado -tiene ms de una pareja sexual o si su pareja tiene ms de una pareja -enfermedad cardiaca -antecedente de embarazo tubrico o ectpico -problemas del sistema inmunolgico -DIU colocado -enfermedad heptica o tumor del hgado -problemas con la coagulacin o si toma diluyentes sanguneos -usa medicamentos intravenoso -forma inusual del tero -sangrado vaginal que no tiene explicacin -una reaccin alrgica o inusual al levonorgestrel, a otras hormonas, a la silicona o polietilenos, a otros medicamentos, alimentos, colorantes o conservantes -si est embarazada o buscando quedar embarazada -si est amamantando a un beb Cmo debo utilizar este medicamento? Un profesional de la salud coloca este dispositivo en el tero. Hable con su pediatra para informarse acerca del uso de este medicamento en nios. Puede requerir atencin especial. Sobredosis: Pngase en contacto inmediatamente con un centro toxicolgico o una sala de urgencia si usted cree que haya tomado demasiado  medicamento. ATENCIN: Este medicamento es solo para usted. No comparta este medicamento con nadie. Qu sucede si me olvido de una dosis? No se aplica en este caso. Qu puede interactuar con este medicamento? No tome esta medicina con ninguno de los siguientes medicamentos: -amprenavir -bosentano -fosamprenavir Esta medicina tambin puede interactuar con los siguientes medicamentos: -aprepitant -barbitricos para producir el sueo o para el tratamiento de convulsiones -bexaroteno -griseofulvina -medicamentos para tratar los convulsiones, tales como carbamazepina, etotona, felbamato, oxcarbazepina, fenitona, topiramato -modafinilo -pioglitazona -rifabutina -rifampicina -rifapentina -algunos medicamentos para tratar el virus VIH, tales como atazanavir, indinavir, lopinavir, nelfinavir, tipranavir, ritonavir -hierba de [place] -warfarina Puede ser que esta lista no menciona todas las posibles interacciones. Informe a su profesional de la salud de todos los productos a base de hierbas, medicamentos de venta libre o suplementos nutritivos que est tomando. Si usted fuma, consume bebidas alcohlicas o si utiliza drogas ilegales, indqueselo tambin a su profesional de la salud. Algunas sustancias pueden interactuar con su medicamento. A qu debo estar atento al usar este medicamento? Visite a su mdico o a su profesional de la salud para chequear su evolucin peridicamente. Visite a su mdico si usted o su pareja tiene relaciones sexuales con otras personas, se vuelve VIH positivo o contrae una enfermedad de transmisin sexual. Este medicamento no la protege de la infeccin por VIH (SIDA) ni de ninguna otra enfermedad de transmisin sexual. Puede controlar la ubicacin del DIU usted misma palpando con sus dedos limpios los hilos en la parte anterior de la vagina. No tire de los hilos. Es un buen hbito controlar la ubicacin del dispositivo despus de cada perodo menstrual. Si no slo  siente los hilos sino que adems siente otra parte ms del DIU o si no puede sentir los hilos, consulte a su mdico   inmediatamente. El DIU puede salirse por s solo. Puede quedar embarazada si el dispositivo se sale de su lugar. Utilice un mtodo anticonceptivo adicional, como preservativos, y consulte a su proveedor de atencin mdica s observa que el DIU se sali de su lugar. La utilizacin de tampones no cambia la posicin del DIU y no hay inconvenientes en usarlos durante su perodo. Qu efectos secundarios puedo tener al utilizar este medicamento? Efectos secundarios que debe informar a su mdico o a su profesional de la salud tan pronto como sea posible: -reacciones alrgicas como erupcin cutnea, picazn o urticarias, hinchazn de la cara, labios o lengua -fiebre, sntomas gripales -llagas genitales -alta presin sangunea -ausencia de un perodo menstrual durante 6 semanas mientras lo utiliza -dolor, hinchazn o calor en las piernas -dolor o sensibilidad del plvico -dolor de cabeza repentino o severo -signos de embarazo -calambres estomacales -falta de aliento repentina -problemas de coordinacin, del habla, al caminar -sangrado, flujo vaginal inusual -color amarillento de los ojos o la piel Efectos secundarios que, por lo general, no requieren atencin mdica (debe informarlos a su mdico o a su profesional de la salud si persisten o si son molestos): -acn -dolor de pecho -cambios en el deseo sexual o capacidad -cambios de peso -calambres, mareos o sensacin de desmayo mientras se introduce el dispositivo -dolor de cabeza -sangrado menstruales irregulares en los primeros 3 a 6 meses de usar -nuseas Puede ser que esta lista no menciona todos los posibles efectos secundarios. Comunquese a su mdico por asesoramiento mdico sobre los efectos secundarios. Usted puede informar los efectos secundarios a la FDA por telfono al 1-800-FDA-1088. Dnde debo guardar mi medicina? No  se aplica en este caso. ATENCIN: Este folleto es un resumen. Puede ser que no cubra toda la posible informacin. Si usted tiene preguntas acerca de esta medicina, consulte con su mdico, su farmacutico o su profesional de la salud.  2014, [employer]. (2011-08-30 16:57:41)  

## 2013-06-10 NOTE — Addendum Note (Signed)
Addended by: Lesly Dukes on: 06/10/2013 10:30 AM   Modules accepted: Orders

## 2013-06-10 NOTE — Progress Notes (Signed)
Pt has [place] for growth on 06/17/13.  NST reactive.  F  87,95,89,91,96,94;  2 hr break 121,130,119,123,130  2 hr lun 130,128,125,127,129  2 hr dinner 126,130,18,130,127 NPH 12 an am, 10 qhs Reg 16 b/f breakast, 14 before dinner Increase NPH at night to 12 Increase NPH in am to 14 Increase reg in am to 18; increase reg b/f dinner to 16 BP stable

## 2013-06-10 NOTE — Progress Notes (Signed)
P= 75 C/o of vaginal itching and burning for the last 8 days, clear discharge.

## 2013-06-10 NOTE — Progress Notes (Signed)
[place] for growth scheduled @ [facility] on 06/17/13.  Wet prep done 4 days ago- negative.

## 2013-06-11 NOTE — Progress Notes (Signed)
NST 06/06/13 reactive 

## 2013-06-13 ENCOUNTER — Other Ambulatory Visit: Payer: Medicaid Other

## 2013-06-17 ENCOUNTER — Encounter (HOSPITAL_COMMUNITY): Payer: Medicaid Other | Admitting: Anesthesiology

## 2013-06-17 ENCOUNTER — Ambulatory Visit (HOSPITAL_COMMUNITY)
Admission: RE | Admit: 2013-06-17 | Discharge: 2013-06-17 | Disposition: A | Discharge status: Home / Self Care | Payer: Medicaid Other | Source: Ambulatory Visit | Attending: Family Medicine | Admitting: Family Medicine

## 2013-06-17 ENCOUNTER — Inpatient Hospital Stay (HOSPITAL_COMMUNITY)
Admission: AD | Admit: 2013-06-17 | Discharge: 2013-06-19 | DRG: 774 | Disposition: A | Discharge status: Home / Self Care | Payer: Medicaid Other | Source: Ambulatory Visit | Attending: Obstetrics and Gynecology | Admitting: Obstetrics and Gynecology

## 2013-06-17 ENCOUNTER — Encounter (HOSPITAL_COMMUNITY): Payer: Self-pay | Admitting: *Deleted

## 2013-06-17 ENCOUNTER — Other Ambulatory Visit: Payer: Self-pay | Admitting: Family Medicine

## 2013-06-17 ENCOUNTER — Inpatient Hospital Stay (HOSPITAL_COMMUNITY): Payer: Medicaid Other | Admitting: Anesthesiology

## 2013-06-17 ENCOUNTER — Other Ambulatory Visit: Payer: Self-pay

## 2013-06-17 ENCOUNTER — Telehealth (HOSPITAL_COMMUNITY): Payer: Self-pay | Admitting: *Deleted

## 2013-06-17 DIAGNOSIS — E119 Type 2 diabetes mellitus without complications: Secondary | ICD-10-CM | POA: Diagnosis present

## 2013-06-17 DIAGNOSIS — O09523 Supervision of elderly multigravida, third trimester: Secondary | ICD-10-CM

## 2013-06-17 DIAGNOSIS — O24913 Unspecified diabetes mellitus in pregnancy, third trimester: Secondary | ICD-10-CM

## 2013-06-17 DIAGNOSIS — O2432 Unspecified pre-existing diabetes mellitus in childbirth: Secondary | ICD-10-CM | POA: Diagnosis present

## 2013-06-17 DIAGNOSIS — O10013 Pre-existing essential hypertension complicating pregnancy, third trimester: Secondary | ICD-10-CM

## 2013-06-17 DIAGNOSIS — O133 Gestational [pregnancy-induced] hypertension without significant proteinuria, third trimester: Secondary | ICD-10-CM

## 2013-06-17 DIAGNOSIS — O24919 Unspecified diabetes mellitus in pregnancy, unspecified trimester: Secondary | ICD-10-CM | POA: Insufficient documentation

## 2013-06-17 DIAGNOSIS — O139 Gestational [pregnancy-induced] hypertension without significant proteinuria, unspecified trimester: Secondary | ICD-10-CM | POA: Insufficient documentation

## 2013-06-17 DIAGNOSIS — O09529 Supervision of elderly multigravida, unspecified trimester: Secondary | ICD-10-CM | POA: Insufficient documentation

## 2013-06-17 DIAGNOSIS — E669 Obesity, unspecified: Secondary | ICD-10-CM | POA: Insufficient documentation

## 2013-06-17 DIAGNOSIS — Z794 Long term (current) use of insulin: Secondary | ICD-10-CM

## 2013-06-17 LAB — GLUCOSE, CAPILLARY
Glucose-Capillary: 108 mg/dL — ABNORMAL HIGH (ref 70–99)
Glucose-Capillary: 65 mg/dL — ABNORMAL LOW (ref 70–99)
Glucose-Capillary: 77 mg/dL (ref 70–99)
Glucose-Capillary: 79 mg/dL (ref 70–99)
Glucose-Capillary: 79 mg/dL (ref 70–99)
Glucose-Capillary: 80 mg/dL (ref 70–99)
Glucose-Capillary: 89 mg/dL (ref 70–99)

## 2013-06-17 LAB — COMPREHENSIVE METABOLIC PANEL
ALT: 18 U/L (ref 0–35)
AST: 35 U/L (ref 0–37)
Albumin: 2.4 g/dL — ABNORMAL LOW (ref 3.5–5.2)
Alkaline Phosphatase: 245 U/L — ABNORMAL HIGH (ref 39–117)
CO2: 22 mEq/L (ref 19–32)
Calcium: 8.6 mg/dL (ref 8.4–10.5)
Chloride: 104 mEq/L (ref 96–112)
Creatinine, Ser: 0.59 mg/dL (ref 0.50–1.10)
GFR calc non Af Amer: >90 mL/min (ref >90)
Glucose, Bld: 93 mg/dL (ref 70–99)
Sodium: 134 mEq/L — ABNORMAL LOW (ref 135–145)

## 2013-06-17 LAB — CBC
Hemoglobin: 10 g/dL — ABNORMAL LOW (ref 12.0–15.0)
MCH: 26.5 pg (ref 26.0–34.0)
Platelets: 207 10*3/uL (ref 150–400)
RBC: 3.77 MIL/uL — ABNORMAL LOW (ref 3.87–5.11)
RDW: 14.6 % (ref 11.5–15.5)
WBC: 7 10*3/uL (ref 4.0–10.5)

## 2013-06-17 LAB — URINE MICROSCOPIC-ADD ON

## 2013-06-17 LAB — TYPE AND SCREEN
ABO/RH(D): O POS
Antibody Screen: NEGATIVE

## 2013-06-17 LAB — URINALYSIS, ROUTINE W REFLEX MICROSCOPIC
Bilirubin Urine: NEGATIVE
Nitrite: NEGATIVE
Protein, ur: NEGATIVE mg/dL
Specific Gravity, Urine: <1.005 — ABNORMAL LOW (ref 1.005–1.030)
Urobilinogen, UA: 0.2 mg/dL (ref 0.0–1.0)

## 2013-06-17 LAB — RPR: RPR Ser Ql: NONREACTIVE

## 2013-06-17 LAB — PROTEIN / CREATININE RATIO, URINE
Creatinine, Urine: 39.11 mg/dL
Total Protein, Urine: 4.7 mg/dL

## 2013-06-17 LAB — ABO/RH: ABO/RH(D): O POS

## 2013-06-17 LAB — URIC ACID: Uric Acid, Serum: 4 mg/dL (ref 2.4–7.0)

## 2013-06-17 IMAGING — US US OB FOLLOW-UP
1 series · 12 of 27 positions shown · non-contrast
Comparison: none

[Series 1: us ob follow-up · 0.26mm/px · 12 of 27 slices shown]
[im 2/27]
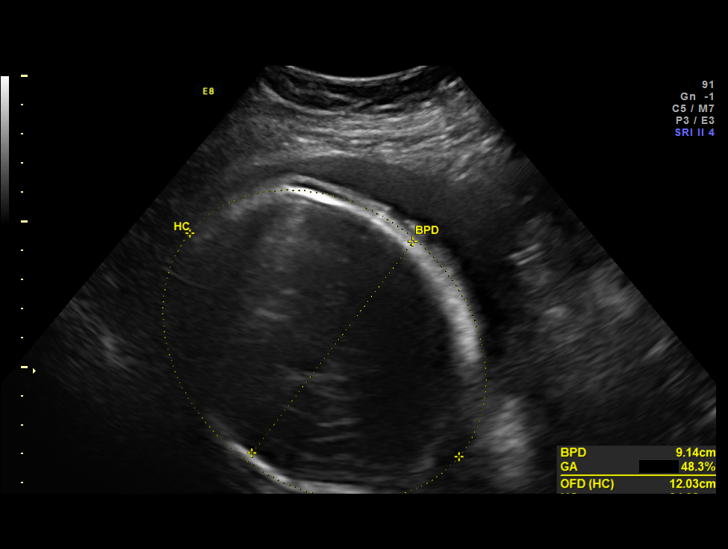
[im 4/27]
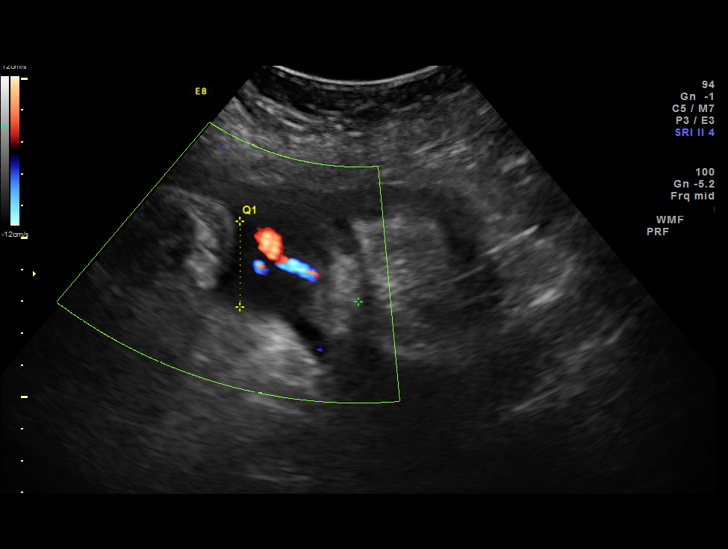
[im 6/27]
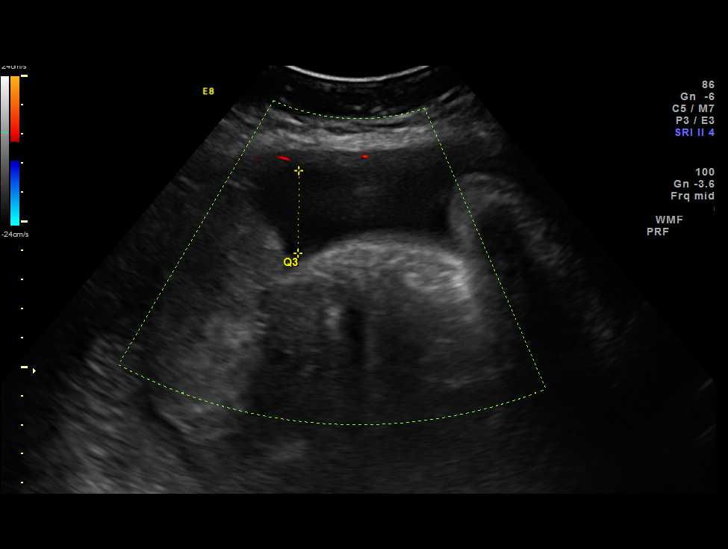
[im 8/27]
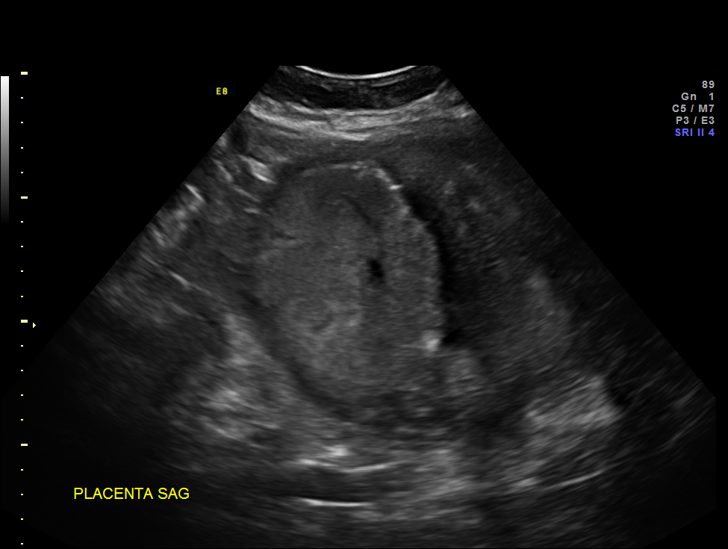
[im 11/27]
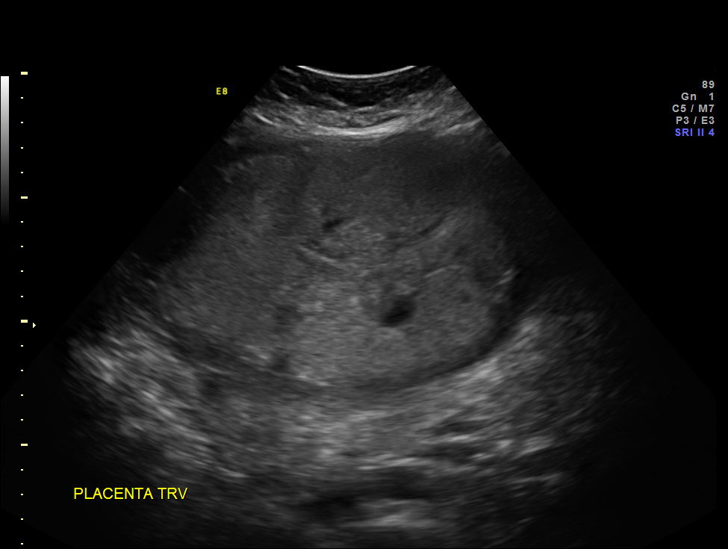
[im 13/27]
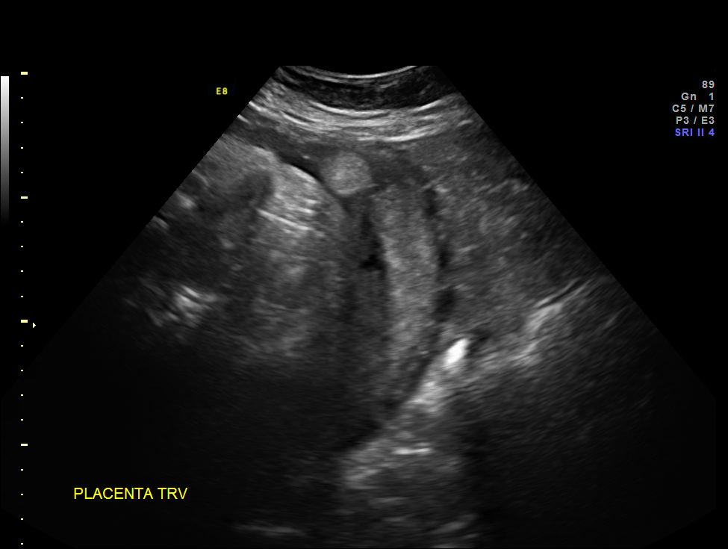
[im 15/27]
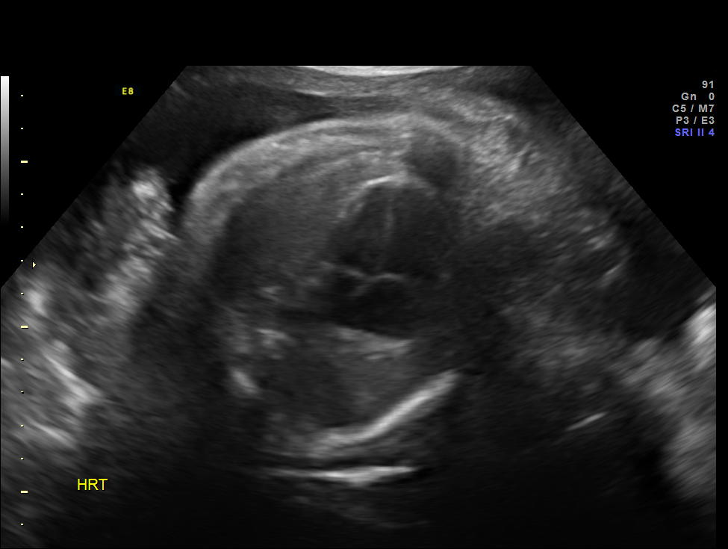
[im 17/27]
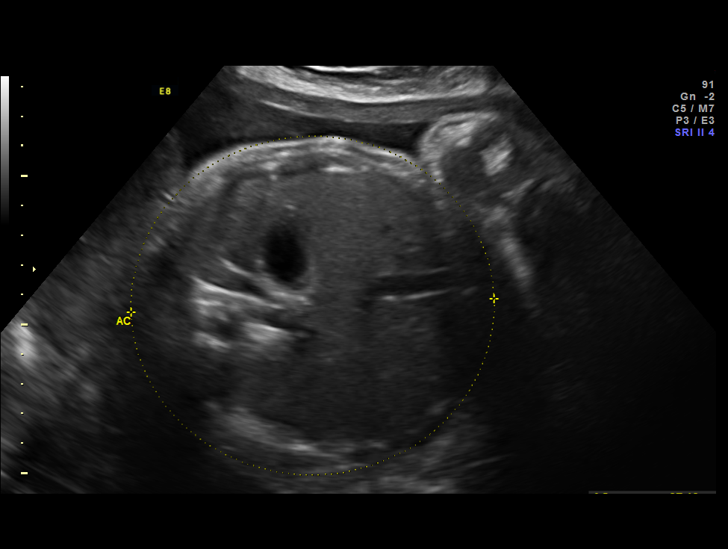
[im 20/27]
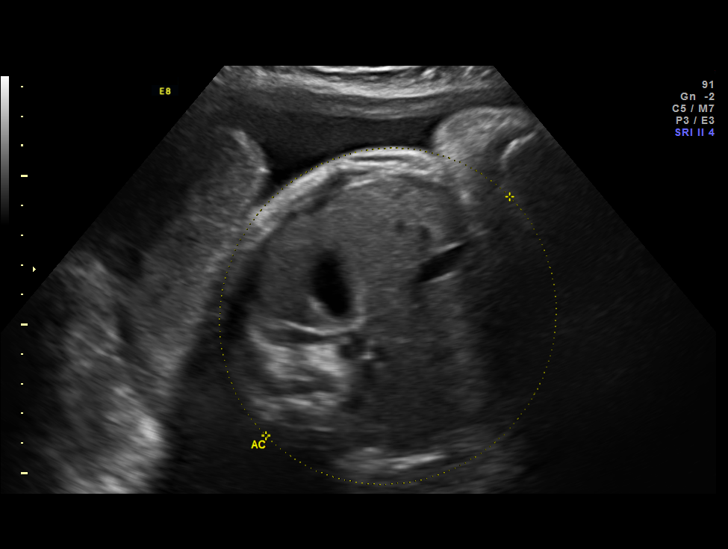
[im 22/27]
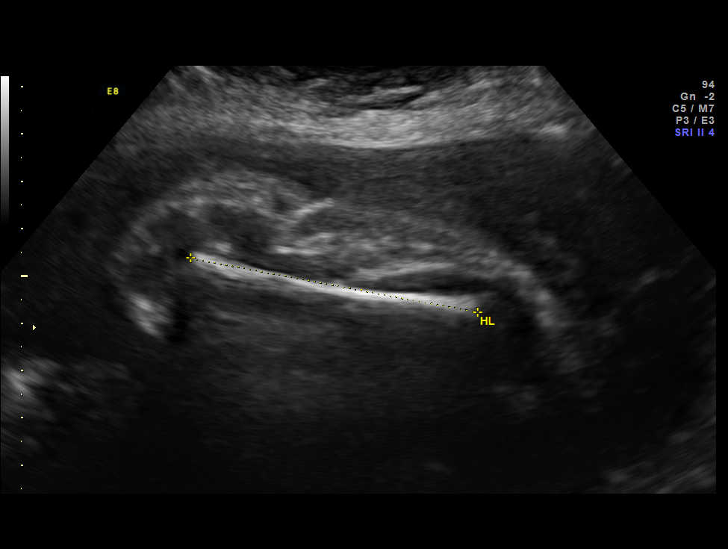
[im 24/27]
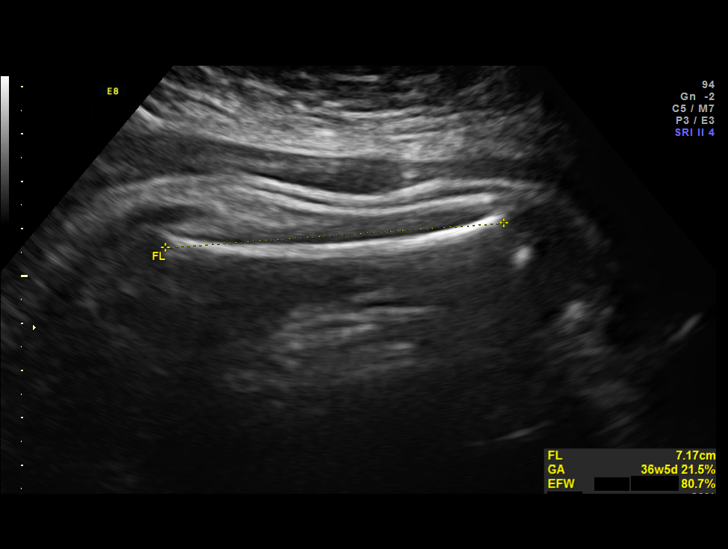
[im 26/27]
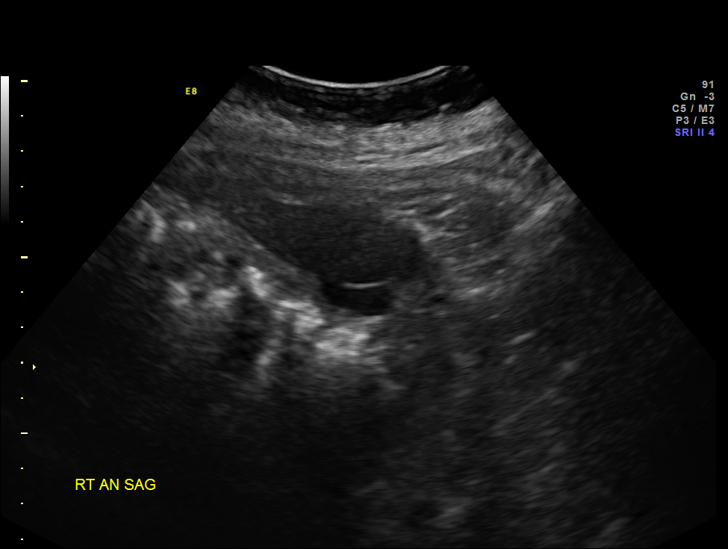

[12 of 27 positions shown; findings below may reference images not displayed]

OBSTETRICS REPORT
                      (Signed Final [DATE] [DATE])

Service(s) Provided

 US OB FOLLOW UP                                       76816.1
Indications

 Diabetes - Pregestational (on insulin)
 Hypertension - Gestational
 Maternal morbid obesity
 Advanced maternal age (37), Multigravida
 (declined genetic counseling)
Fetal Evaluation

 Num Of Fetuses:    1
 Fetal Heart Rate:  136                          bpm
 Cardiac Activity:  Observed
 Presentation:      Cephalic
 Placenta:          Posterior Fundal, above
                    cervical os
 P. Cord            Previously Visualized
 Insertion:

 Amniotic Fluid
 AFI FV:      Subjectively within normal limits
 AFI Sum:     13.55   cm       51  %Tile     Larg Pckt:    4.54  cm
 RUQ:   2.69    cm   RLQ:    4.54   cm    LUQ:   3.48    cm   LLQ:    2.84   cm
Biophysical Evaluation

 Amniotic F.V:   Pocket => 2 cm two         F. Tone:        Observed
                 planes
 F. Movement:    Observed                   Score:          [DATE]
 F. Breathing:   Observed
Biometry

 BPD:     91.2  mm     G. Age:  37w 0d                CI:         76.8   70 - 86
 OFD:    118.7  mm                                    FL/HC:      21.3   20.9 -

 HC:     338.1  mm     G. Age:  38w 6d       50  %    HC/AC:      0.97   0.92 -

 AC:     350.3  mm     G. Age:  39w 0d       87  %    FL/BPD:     78.8   71 - 87
 FL:      71.9  mm     G. Age:  36w 6d       24  %    FL/AC:      20.5   20 - 24
 HUM:     61.9  mm     G. Age:  35w 6d       38  %
 Est. FW:    [3W]  gm      7 lb 9 oz     81  %
Gestational Age

 LMP:           34w 5d        Date:  [DATE]                 EDD:   [DATE]
 U/S Today:     38w 0d                                        EDD:   [DATE]
 Best:          38w 0d     Det. By:  U/S ([DATE])           EDD:   [DATE]
Anatomy

 Cranium:          Appears normal         Aortic Arch:      Previously seen
 Fetal Cavum:      Previously seen        Ductal Arch:      Previously seen
 Ventricles:       Previously seen        Diaphragm:        Previously seen
 Choroid Plexus:   Previously seen        Stomach:          Appears normal, left
                                                            sided
 Cerebellum:       Previously seen        Abdomen:          Appears normal
 Posterior Fossa:  Previously seen        Abdominal Wall:   Previously seen
 Nuchal Fold:      Not applicable (>20    Cord Vessels:     Previously seen
                   wks GA)
 Face:             Orbits and profile     Kidneys:          Previously seen
                   previously seen
 Lips:             Previously seen        Bladder:          Appears normal
 Heart:            Appears normal         Spine:            Not well visualized
                   (4CH, axis, and
                   situs)
 RVOT:             Previously seen        Lower             Previously seen
                                          Extremities:
 LVOT:             Previously seen        Upper             Previously seen
                                          Extremities:

 Other:  Female gender. Heels previously seen. Technically difficult due to
         maternal habitus and fetal position.
Cervix Uterus Adnexa

 Cervix:       Not visualized (advanced GA >[3W])
 Left Ovary:    Within normal limits.
 Right Ovary:   Within normal limits.
 Adnexa:     No abnormality visualized.
Impression

 Single IUP at 38 0/7 weeks
 Pregestational diabetes, gestational hypertension
 The estimated fetal weight today is at the 81st %tile.
 Active fetus with BPP of [DATE]
 Normal amniotic fluid volume

 BPs: 153/97, 164/100, 147/97
Recommendations

 Patient was sent to [facility] for further evaluation- rule out
 preeclampsia
 If BPs are presistently elvated, would recommend delivery

 Discussed with Dr. [facility]
 [facility] with us.  Please do not hesitate to contact

## 2013-06-17 MED ORDER — ACETAMINOPHEN 325 MG PO TABS
650.0000 mg | ORAL_TABLET | ORAL | Status: DC | PRN
  Administered 2013-06-17 – 2013-06-18 (×2): 650 mg via ORAL
  Filled 2013-06-17 (×2): qty 2

## 2013-06-17 MED ORDER — ONDANSETRON HCL 4 MG/2ML IJ SOLN: 4.0000 mg | Freq: Four times a day (QID) | INTRAMUSCULAR | Status: DC | PRN

## 2013-06-17 MED ORDER — LIDOCAINE HCL (PF) 1 % IJ SOLN
30.0000 mL | INTRAMUSCULAR | Status: DC | PRN
  Filled 2013-06-17 (×2): qty 30

## 2013-06-17 MED ORDER — TERBUTALINE SULFATE 1 MG/ML IJ SOLN: 0.2500 mg | Freq: Once | INTRAMUSCULAR | Status: AC | PRN

## 2013-06-17 MED ORDER — LACTATED RINGERS IV SOLN
INTRAVENOUS | Status: DC
  Administered 2013-06-17 (×2): via INTRAVENOUS

## 2013-06-17 MED ORDER — FENTANYL 2.5 MCG/ML BUPIVACAINE 1/10 % EPIDURAL INFUSION (WH - ANES)
14.0000 mL/h | INTRAMUSCULAR | Status: DC | PRN
  Filled 2013-06-17: qty 125

## 2013-06-17 MED ORDER — DEXTROSE-NACL 5-0.45 % IV SOLN: INTRAVENOUS | Status: DC

## 2013-06-17 MED ORDER — OXYCODONE-ACETAMINOPHEN 5-325 MG PO TABS: 1.0000 | ORAL_TABLET | ORAL | Status: DC | PRN

## 2013-06-17 MED ORDER — DIPHENHYDRAMINE HCL 50 MG/ML IJ SOLN: 12.5000 mg | INTRAMUSCULAR | Status: DC | PRN

## 2013-06-17 MED ORDER — OXYTOCIN BOLUS FROM INFUSION: 500.0000 mL | INTRAVENOUS | Status: DC

## 2013-06-17 MED ORDER — FLEET ENEMA 7-19 GM/118ML RE ENEM: 1.0000 | ENEMA | RECTAL | Status: DC | PRN

## 2013-06-17 MED ORDER — PHENYLEPHRINE 40 MCG/ML (10ML) SYRINGE FOR IV PUSH (FOR BLOOD PRESSURE SUPPORT)
80.0000 ug | PREFILLED_SYRINGE | INTRAVENOUS | Status: DC | PRN
  Filled 2013-06-17: qty 2

## 2013-06-17 MED ORDER — FENTANYL 2.5 MCG/ML BUPIVACAINE 1/10 % EPIDURAL INFUSION (WH - ANES)
INTRAMUSCULAR | Status: DC | PRN
  Administered 2013-06-17: 11 mL/h via EPIDURAL

## 2013-06-17 MED ORDER — SODIUM CHLORIDE 0.9 % IV SOLN: INTRAVENOUS | Status: DC

## 2013-06-17 MED ORDER — DEXTROSE IN LACTATED RINGERS 5 % IV SOLN
INTRAVENOUS | Status: DC
  Administered 2013-06-17 – 2013-06-18 (×2): via INTRAVENOUS

## 2013-06-17 MED ORDER — LACTATED RINGERS IV SOLN: 500.0000 mL | Freq: Once | INTRAVENOUS | Status: DC

## 2013-06-17 MED ORDER — DEXTROSE 50 % IV SOLN: 25.0000 mL | INTRAVENOUS | Status: DC | PRN

## 2013-06-17 MED ORDER — IBUPROFEN 600 MG PO TABS: 600.0000 mg | ORAL_TABLET | Freq: Four times a day (QID) | ORAL | Status: DC | PRN

## 2013-06-17 MED ORDER — INSULIN REGULAR BOLUS VIA INFUSION
0.0000 [IU] | Freq: Three times a day (TID) | INTRAVENOUS | Status: DC
Start: 2013-06-17 — End: 2013-06-18
  Filled 2013-06-17: qty 10

## 2013-06-17 MED ORDER — SODIUM CHLORIDE 0.9 % IV SOLN
INTRAVENOUS | Status: DC
  Administered 2013-06-17: 0.1 [IU]/h via INTRAVENOUS
  Filled 2013-06-17: qty 1

## 2013-06-17 MED ORDER — EPHEDRINE 5 MG/ML INJ
10.0000 mg | INTRAVENOUS | Status: DC | PRN
  Filled 2013-06-17: qty 2

## 2013-06-17 MED ORDER — MISOPROSTOL 50MCG HALF TABLET
50.0000 ug | ORAL_TABLET | Freq: Once | ORAL | Status: AC
  Administered 2013-06-17: 50 ug via ORAL
  Filled 2013-06-17: qty 1

## 2013-06-17 MED ORDER — LIDOCAINE HCL (PF) 1 % IJ SOLN
INTRAMUSCULAR | Status: DC | PRN
  Administered 2013-06-17: 4 mL
  Administered 2013-06-17: 3 mL

## 2013-06-17 MED ORDER — PHENYLEPHRINE 40 MCG/ML (10ML) SYRINGE FOR IV PUSH (FOR BLOOD PRESSURE SUPPORT)
80.0000 ug | PREFILLED_SYRINGE | INTRAVENOUS | Status: DC | PRN
  Filled 2013-06-17: qty 10
  Filled 2013-06-17: qty 2

## 2013-06-17 MED ORDER — CITRIC ACID-SODIUM CITRATE 334-500 MG/5ML PO SOLN: 30.0000 mL | ORAL | Status: DC | PRN

## 2013-06-17 MED ORDER — EPHEDRINE 5 MG/ML INJ
10.0000 mg | INTRAVENOUS | Status: DC | PRN
  Filled 2013-06-17: qty 4
  Filled 2013-06-17: qty 2

## 2013-06-17 MED ORDER — OXYTOCIN 40 UNITS IN LACTATED RINGERS INFUSION - SIMPLE MED
1.0000 m[IU]/min | INTRAVENOUS | Status: DC
  Administered 2013-06-17: 2 m[IU]/min via INTRAVENOUS
  Administered 2013-06-18: 10 m[IU]/min via INTRAVENOUS

## 2013-06-17 MED ORDER — LACTATED RINGERS IV SOLN: 500.0000 mL | INTRAVENOUS | Status: DC | PRN

## 2013-06-17 MED ORDER — OXYTOCIN 40 UNITS IN LACTATED RINGERS INFUSION - SIMPLE MED
62.5000 mL/h | INTRAVENOUS | Status: DC
  Filled 2013-06-17: qty 1000

## 2013-06-17 NOTE — Progress Notes (Signed)
Rhonda Waller is a 37 y.o. [id number] at [redacted]w[redacted]d admitted for induction of labor due to Hypertension.  Subjective: Feeling more pressure, some urge to push. No pain with epidural  Objective: BP 134/85  Pulse 81  Temp(Src) 99.1 F (37.3 C) (Oral)  Resp 18  Ht 5' (1.524 m)  Wt 99.791 kg (220 lb)  BMI 42.97 kg/m2  SpO2 98%   Total I/O In: 100 [I.V.:100] Out: -   FHT:  FHR: 135 bpm, variability: moderate,  accelerations:  Present,  decelerations:  Absent UC:   More regular than earlier, still spaced every 4-7 minutes SVE:   Dilation: 4 Effacement (%): 80 Station: -2 Exam by:: Dr Casper Harrison  Labs: Lab Results  Component Value Date   WBC 7.0 06/17/2013   HGB 10.0* 06/17/2013   HCT 30.5* 06/17/2013   MCV 80.9 06/17/2013   PLT 207 06/17/2013    Assessment / Plan: IOL to gHTN, more pressure but cervix unchanged since last check (maybe some thinning out)  Labor: S/p Cytotec x1, s/p FB, continue Pitocin, consider AROM at next check in 1-2 hours if not making much change gHTN:  intake and ouput balanced and labs stable A2DM: On glucosestabilizer. Doing well.  Fetal Wellbeing:  Category I Pain Control:  Epidural I/D:  n/a Anticipated MOD:  NSVD  Rhonda Morton, MD PGY-2, [facility] 06/17/2013, 11:33 PM

## 2013-06-17 NOTE — Progress Notes (Signed)
Rhonda Waller is a 37 y.o. [id number] at [redacted]w[redacted]d admitted for induction of labor due to Hypertension.  Subjective:  Doing well. Some cramping with FB. Contracting on monitor but not feeling them much. +FM.   Objective: BP 149/81  Pulse 75  Temp(Src) 98.9 F (37.2 C) (Oral)  Resp 20  Ht 5' (1.524 m)  Wt 99.791 kg (220 lb)  BMI 42.97 kg/m2      FHT:  FHR: 145 bpm, variability: moderate,  accelerations:  Present,  decelerations:  Absent UC:   irregular, every 4-7 minutes SVE:   Dilation: 1 Exam by:: Dr. Reola Calkins  Labs: Lab Results  Component Value Date   WBC 7.0 06/17/2013   HGB 10.0* 06/17/2013   HCT 30.5* 06/17/2013   MCV 80.9 06/17/2013   PLT 207 06/17/2013    Assessment / Plan: IOL to gHTN  Labor: s/p cytotec x1. FB in place but slowly losening gHTN:  intake and ouput balanced and labs stable one severe range diastolic but none since A2DM: on glucosestabilizer. Doing well.  Fetal Wellbeing:  Category I Pain Control:  Labor support without medications I/D:  n/a Anticipated MOD:  NSVD  Alice Burnside L 06/17/2013, 6:42 PM

## 2013-06-17 NOTE — Anesthesia Procedure Notes (Signed)
Epidural Patient location during procedure: OB Start time: 06/17/2013 10:39 PM  Staffing Anesthesiologist: Ashle Stief A. Performed by: anesthesiologist   Preanesthetic Checklist Completed: patient identified, site marked, surgical consent, pre-op evaluation, timeout performed, IV checked, risks and benefits discussed and monitors and equipment checked  Epidural Patient position: sitting Prep: site prepped and draped and DuraPrep Patient monitoring: continuous pulse ox and blood pressure Approach: midline Injection technique: LOR air  Needle:  Needle type: Tuohy  Needle gauge: 17 G Needle length: 9 cm and 9 Needle insertion depth: 5 cm cm Catheter type: closed end flexible Catheter size: 19 Gauge Catheter at skin depth: 10 cm Test dose: negative and Other  Assessment Events: blood not aspirated, injection not painful, no injection resistance, negative IV test and no paresthesia  Additional Notes Patient identified. Risks and benefits discussed including failed block, incomplete  Pain control, post dural puncture headache, nerve damage, paralysis, blood pressure Changes, nausea, vomiting, reactions to medications-both toxic and allergic and post Partum back pain. All questions were answered. Patient expressed understanding and wished to proceed. Sterile technique was used throughout procedure. Epidural site was Dressed with sterile barrier dressing. No paresthesias, signs of intravascular injection Or signs of intrathecal spread were encountered. Spanish interpreter present throughout procedure. Patient was more comfortable after the epidural was dosed. Please see RN's note for documentation of vital signs and FHR which are stable.

## 2013-06-17 NOTE — Progress Notes (Addendum)
Rhonda Waller is a 37 y.o. [id number] at [redacted]w[redacted]d admitted for induction of labor due to Hypertension.  Subjective: Doing well. Some minor pains with contractions, but denies other new symptoms. No headaches.   Objective: BP 163/82  Pulse 72  Temp(Src) 99.4 F (37.4 C) (Oral)  Resp 18  Ht 5' (1.524 m)  Wt 99.791 kg (220 lb)  BMI 42.97 kg/m2   Total I/O In: 100 [I.V.:100] Out: -   FHT:  FHR: 140-150 with some poor pickup bpm, variability: moderate,  accelerations:  Present,  decelerations:  Absent UC:   irregular, every 4-7 minutes SVE:   Dilation: 4 Effacement (%): 60 Station: -2 Exam by::  Raliegh Ip RN)  Labs: Lab Results  Component Value Date   WBC 7.0 06/17/2013   HGB 10.0* 06/17/2013   HCT 30.5* 06/17/2013   MCV 80.9 06/17/2013   PLT 207 06/17/2013    Assessment / Plan: IOL to gHTN  Labor: S/p Cytotec x1, s/y FB, starting Pitocin gHTN:  intake and ouput balanced and labs stable, BP above right after cervix check, repeat 147/91 A2DM: On glucosestabilizer. Doing well.  Fetal Wellbeing:  Category I Pain Control:  Labor support without medications, planning epidural I/D:  n/a Anticipated MOD:  NSVD  Bobbye Morton, MD PGY-2, [facility] 06/17/2013, 9:49 PM

## 2013-06-17 NOTE — MAU Provider Note (Signed)
Chief Complaint:  Hypertension   Rhonda Waller is a 37 y.o.  [id number] with IUP at [redacted]w[redacted]d by 27 week [place] presenting for Hypertension  Pt was seen earlier today in [place] for growth scan and found to have pressures in the 140-160s/80-90s.  She was sent here for evaluation. No HA, vision changes, abd pain.  Some occasional contractions. +FM. No vb, LOF.   PNC at [facility] since 28 weeks.  Pregnancy complicated by A2DM on insulin. She has documented hx of cHTN although in review, has never been on medication and pressures in the first 20 weeks of pregnancy were fairly normal.  Otherwise, her pregnancy has been uncomplicated. GBS neg.    Menstrual History: OB History   Grav Para Term Preterm Abortions TAB SAB Ect Mult Living   6 4 4  0 1 0 1 0 0 4    G1- term, 40 weeks, NSVD  G2- term, 40 weeks, NSVD G3- SAB G4- term, 40 weeks, NSVD G5- Term, 39 weeks, NSVD G6- current    No LMP recorded. Patient is pregnant.      Past Medical History  Diagnosis Date  . Pregnancy induced hypertension   . Diabetes mellitus without complication 2012    Past Surgical History  Procedure Laterality Date  . Cholecystectomy      Family History  Problem Relation Age of Onset  . Diabetes Mother   . Diabetes Maternal Uncle     History  Substance Use Topics  . Smoking status: Never Smoker   . Smokeless tobacco: Never Used  . Alcohol Use: No     No Known Allergies  Prescriptions prior to admission  Medication Sig Dispense Refill  . insulin NPH (HUMULIN N,NOVOLIN N) 100 UNIT/ML injection Inject 12-14 Units into the skin 2 (two) times daily at 8 am and 10 pm. Inject 14 units into the skin at breakfast and 12 units at 9 pm      . insulin regular (NOVOLIN R,HUMULIN R) 100 units/mL injection Inject 18 Units into the skin 2 (two) times daily before a meal. Inject 18 units Regular insulin before breakfast and 16 units Regular before dinner.      . Prenatal Vit-Fe Fumarate-FA (MULTIVITAMIN-PRENATAL) 27-0.8  MG TABS tablet Take 1 tablet by mouth daily at 12 noon.      . [DISCONTINUED] insulin NPH (HUMULIN N,NOVOLIN N) 100 UNIT/ML injection Inject 14 units into the skin at breakfast and 12 units at 9 pm  1 vial  3  . [DISCONTINUED] insulin regular (HUMULIN R) 100 units/mL injection Inject 18 units Regular insulin before breakfast and 16 units Regular before dinner.  10 mL  3  . Insulin Syringe-Needle U-100 (B-D INSULIN SYRINGE) 31G X 5/16" 0.3 ML MISC 1 each by Does not apply route 4 (four) times daily.  100 each  0    Review of Systems - Negative except for what is mentioned in HPI.  Physical Exam  Blood pressure 135/99, pulse 75, temperature 97.9 F (36.6 C), temperature source Oral, resp. rate 18, height 5' (1.524 m), weight 99.791 kg (220 lb). GENERAL: Well-developed, well-nourished female in no acute distress.  LUNGS: Clear to auscultation bilaterally.  HEART: Regular rate and rhythm. ABDOMEN: Soft, nontender, nondistended, gravid.  EXTREMITIES: Nontender, no edema, 2+ distal pulses. Cervical Exam: Dilatation 1cm   Effacement 20%   Station -3   Presentation: cephalic FHT:  Baseline rate 130 bpm   Variability moderate  Accelerations present   Decelerations none Contractions: irregular Every 3-6 mins  Labs: Results for orders placed during the hospital encounter of 06/17/13 (from the past 24 hour(s))  URINALYSIS, ROUTINE W REFLEX MICROSCOPIC   Collection Time    06/17/13 11:10 AM      Result Value Range   Color, Urine YELLOW  YELLOW   APPearance CLEAR  CLEAR   Specific Gravity, Urine <1.005 (*) 1.005 - 1.030   pH 6.0  5.0 - 8.0   Glucose, UA NEGATIVE  NEGATIVE mg/dL   Hgb urine dipstick NEGATIVE  NEGATIVE   Bilirubin Urine NEGATIVE  NEGATIVE   Ketones, ur NEGATIVE  NEGATIVE mg/dL   Protein, ur NEGATIVE  NEGATIVE mg/dL   Urobilinogen, UA 0.2  0.0 - 1.0 mg/dL   Nitrite NEGATIVE  NEGATIVE   Leukocytes, UA SMALL (*) NEGATIVE  URINE MICROSCOPIC-ADD ON   Collection Time    06/17/13  11:10 AM      Result Value Range   Squamous Epithelial / LPF RARE  RARE   WBC, UA 3-6  <3 WBC/hpf   Bacteria, UA RARE  RARE  CBC   Collection Time    06/17/13 11:40 AM      Result Value Range   WBC 7.0  4.0 - 10.5 K/uL   RBC 3.77 (*) 3.87 - 5.11 MIL/uL   Hemoglobin 10.0 (*) 12.0 - 15.0 g/dL   HCT 16.1 (*) 09.6 - 04.5 %   MCV 80.9  78.0 - 100.0 fL   MCH 26.5  26.0 - 34.0 pg   MCHC 32.8  30.0 - 36.0 g/dL   RDW 40.9  81.1 - 91.4 %   Platelets 207  150 - 400 K/uL  COMPREHENSIVE METABOLIC PANEL   Collection Time    06/17/13 11:40 AM      Result Value Range   Sodium 134 (*) 135 - 145 mEq/L   Potassium 4.4  3.5 - 5.1 mEq/L   Chloride 104  96 - 112 mEq/L   CO2 22  19 - 32 mEq/L   Glucose, Bld 93  70 - 99 mg/dL   BUN 11  6 - 23 mg/dL   Creatinine, Ser 7.82  0.50 - 1.10 mg/dL   Calcium 8.6  8.4 - 95.6 mg/dL   Total Protein 6.5  6.0 - 8.3 g/dL   Albumin 2.4 (*) 3.5 - 5.2 g/dL   AST 35  0 - 37 U/L   ALT 18  0 - 35 U/L   Alkaline Phosphatase 245 (*) 39 - 117 U/L   Total Bilirubin 0.6  0.3 - 1.2 mg/dL   GFR calc non Af Amer >90  >90 mL/min   GFR calc Af Amer >90  >90 mL/min  URIC ACID   Collection Time    06/17/13 11:40 AM      Result Value Range   Uric Acid, Serum 4.0  2.4 - 7.0 mg/dL    Imaging Studies:  Single IUP at 38 0/7 weeks  Pregestational diabetes, gestational hypertension  The estimated fetal weight today is at the 81st %tile.  Active fetus with BPP of 8/8  Normal amniotic fluid volume     Assessment: Rhonda Waller is  37 y.o. [id number] at [redacted]w[redacted]d presents with Hypertension In review, ? of gHTN vs. CHTN. Now with BP elevated and not quite severe range but closer to. Labs all unremarkalbe and prot/cr ration of 0.12.  Bishops score low, but pt is multiparous and full term. Will admit and induce for gHTN.   Plan: 1)IOL for gHTN - admit to L&D - routine  orders - epidural prn - foley bulb placed and inflated with 60cc of LR - cytotec po given x 1.    2) A2DM - sugar here 93 - will start glucosestabilizer  - hold home insulin  3) FWB - cat I tracing - gbs neg - EFW 81%ile this AM  4) anticipate SVD  Peniel Biel L 11/24/20141:32 PM

## 2013-06-17 NOTE — MAU Note (Signed)
Sent from MFM for elevated BPs;

## 2013-06-17 NOTE — Telephone Encounter (Signed)
Preadmission screen Interpreter number [phone]

## 2013-06-17 NOTE — H&P (Signed)
HISTORY AND PHYSICAL  Chief Complaint: Hypertension   Rhonda Waller is a 37 y.o. [id number] with IUP at [redacted]w[redacted]d by 27 week [place] presenting for Hypertension   Pt was seen earlier today in [place] for growth scan and found to have pressures in the 140-160s/80-90s. She was sent here for evaluation. No HA, vision changes, abd pain. Some occasional contractions. +FM. No vb, LOF.  PNC at [facility] since 28 weeks. Pregnancy complicated by A2DM on insulin. She has documented hx of cHTN although in review, has never been on medication and pressures in the first 20 weeks of pregnancy were fairly normal. Otherwise, her pregnancy has been uncomplicated. GBS neg.    Menstrual History:  OB History    Grav  Para  Term  Preterm  Abortions  TAB  SAB  Ect  Mult  Living    6  4  4   0  1  0  1  0  0  4     G1- term, 40 weeks, NSVD  G2- term, 40 weeks, NSVD  G3- SAB  G4- term, 40 weeks, NSVD  G5- Term, 39 weeks, NSVD  G6- current  No LMP recorded. Patient is pregnant.   Past Medical History   Diagnosis  Date   .  Pregnancy induced hypertension    .  Diabetes mellitus without complication  2012    Past Surgical History   Procedure  Laterality  Date   .  Cholecystectomy      Family History   Problem  Relation  Age of Onset   .  Diabetes  Mother    .  Diabetes  Maternal Uncle     History   Substance Use Topics   .  Smoking status:  Never Smoker   .  Smokeless tobacco:  Never Used   .  Alcohol Use:  No    No Known Allergies  Prescriptions prior to admission   Medication  Sig  Dispense  Refill   .  insulin NPH (HUMULIN N,NOVOLIN N) 100 UNIT/ML injection  Inject 12-14 Units into the skin 2 (two) times daily at 8 am and 10 pm. Inject 14 units into the skin at breakfast and 12 units at 9 pm     .  insulin regular (NOVOLIN R,HUMULIN R) 100 units/mL injection  Inject 18 Units into the skin 2 (two) times daily before a meal. Inject 18 units Regular insulin before breakfast and 16 units Regular before dinner.      .  Prenatal Vit-Fe Fumarate-FA (MULTIVITAMIN-PRENATAL) 27-0.8 MG TABS tablet  Take 1 tablet by mouth daily at 12 noon.     .  [DISCONTINUED] insulin NPH (HUMULIN N,NOVOLIN N) 100 UNIT/ML injection  Inject 14 units into the skin at breakfast and 12 units at 9 pm  1 vial  3   .  [DISCONTINUED] insulin regular (HUMULIN R) 100 units/mL injection  Inject 18 units Regular insulin before breakfast and 16 units Regular before dinner.  10 mL  3   .  Insulin Syringe-Needle U-100 (B-D INSULIN SYRINGE) 31G X 5/16" 0.3 ML MISC  1 each by Does not apply route 4 (four) times daily.  100 each  0    Review of Systems - Negative except for what is mentioned in HPI.   Physical Exam   Blood pressure 135/99, pulse 75, temperature 97.9 F (36.6 C), temperature source Oral, resp. rate 18, height 5' (1.524 m), weight 99.791 kg (220 lb).  GENERAL: Well-developed, well-nourished female in no  acute distress.  LUNGS: Clear to auscultation bilaterally.  HEART: Regular rate and rhythm.  ABDOMEN: Soft, nontender, nondistended, gravid.  EXTREMITIES: Nontender, no edema, 2+ distal pulses.  Cervical Exam: Dilatation 1cm Effacement 20% Station -3  Presentation: cephalic  FHT: Baseline rate 130 bpm Variability moderate Accelerations present Decelerations none  Contractions: irregular Every 3-6 mins   Labs:  Results for orders placed during the hospital encounter of 06/17/13 (from the past 24 hour(s))   URINALYSIS, ROUTINE W REFLEX MICROSCOPIC    Collection Time    06/17/13 11:10 AM   Result  Value  Range    Color, Urine  YELLOW  YELLOW    APPearance  CLEAR  CLEAR    Specific Gravity, Urine  <1.005 (*)  1.005 - 1.030    pH  6.0  5.0 - 8.0    Glucose, UA  NEGATIVE  NEGATIVE mg/dL    Hgb urine dipstick  NEGATIVE  NEGATIVE    Bilirubin Urine  NEGATIVE  NEGATIVE    Ketones, ur  NEGATIVE  NEGATIVE mg/dL    Protein, ur  NEGATIVE  NEGATIVE mg/dL    Urobilinogen, UA  0.2  0.0 - 1.0 mg/dL    Nitrite  NEGATIVE  NEGATIVE     Leukocytes, UA  SMALL (*)  NEGATIVE   URINE MICROSCOPIC-ADD ON    Collection Time    06/17/13 11:10 AM   Result  Value  Range    Squamous Epithelial / LPF  RARE  RARE    WBC, UA  3-6  <3 WBC/hpf    Bacteria, UA  RARE  RARE   CBC    Collection Time    06/17/13 11:40 AM   Result  Value  Range    WBC  7.0  4.0 - 10.5 K/uL    RBC  3.77 (*)  3.87 - 5.11 MIL/uL    Hemoglobin  10.0 (*)  12.0 - 15.0 g/dL    HCT  16.1 (*)  09.6 - 46.0 %    MCV  80.9  78.0 - 100.0 fL    MCH  26.5  26.0 - 34.0 pg    MCHC  32.8  30.0 - 36.0 g/dL    RDW  04.5  40.9 - 81.1 %    Platelets  207  150 - 400 K/uL   COMPREHENSIVE METABOLIC PANEL    Collection Time    06/17/13 11:40 AM   Result  Value  Range    Sodium  134 (*)  135 - 145 mEq/L    Potassium  4.4  3.5 - 5.1 mEq/L    Chloride  104  96 - 112 mEq/L    CO2  22  19 - 32 mEq/L    Glucose, Bld  93  70 - 99 mg/dL    BUN  11  6 - 23 mg/dL    Creatinine, Ser  9.14  0.50 - 1.10 mg/dL    Calcium  8.6  8.4 - 10.5 mg/dL    Total Protein  6.5  6.0 - 8.3 g/dL    Albumin  2.4 (*)  3.5 - 5.2 g/dL    AST  35  0 - 37 U/L    ALT  18  0 - 35 U/L    Alkaline Phosphatase  245 (*)  39 - 117 U/L    Total Bilirubin  0.6  0.3 - 1.2 mg/dL    GFR calc non Af Amer  >90  >90 mL/min    GFR  calc Af Amer  >90  >90 mL/min   URIC ACID    Collection Time    06/17/13 11:40 AM   Result  Value  Range    Uric Acid, Serum  4.0  2.4 - 7.0 mg/dL    Imaging Studies:  Single IUP at 38 0/7 weeks  Pregestational diabetes, gestational hypertension  The estimated fetal weight today is at the 81st %tile.  Active fetus with BPP of 8/8  Normal amniotic fluid volume    Assessment:  Rhonda Waller is 37 y.o. [id number] at [redacted]w[redacted]d presents with Hypertension  In review, ? of gHTN vs. CHTN. Now with BP elevated and not quite severe range but closer to. Labs all unremarkalbe and prot/cr ration of 0.12.  Bishops score low, but pt is multiparous and full term. Will admit and induce for gHTN.     Plan:    1)IOL for gHTN  - admit to L&D  - routine orders  - epidural prn  - foley bulb placed and inflated with 60cc of LR  - cytotec po given x 1.   2) A2DM  - sugar here 93  - will start glucosestabilizer  - hold home insulin   3) FWB  - cat I tracing  - gbs neg  - EFW 81%ile this AM   4) anticipate SVD   Dimitrious Micciche L  11/24/20141:32 PM

## 2013-06-17 NOTE — Anesthesia Preprocedure Evaluation (Signed)
Anesthesia Evaluation  Patient identified by MRN, date of birth, ID band Patient awake    Reviewed: Allergy & Precautions, H&P , Patient's Chart, lab work & pertinent test results  Airway Mallampati: III TM Distance: >3 FB Neck ROM: Full    Dental no notable dental hx. (+) Teeth Intact   Pulmonary neg pulmonary ROS,  breath sounds clear to auscultation  Pulmonary exam normal       Cardiovascular hypertension, Rhythm:Regular Rate:Normal     Neuro/Psych negative neurological ROS  negative psych ROS   GI/Hepatic negative GI ROS, Neg liver ROS,   Endo/Other  diabetes, Well Controlled, Type 2, Insulin DependentMorbid obesity  Renal/GU negative Renal ROS  negative genitourinary   Musculoskeletal negative musculoskeletal ROS (+)   Abdominal (+) + obese,   Peds  Hematology negative hematology ROS (+)   Anesthesia Other Findings   Reproductive/Obstetrics (+) Pregnancy PIH on no Rx                           Anesthesia Physical Anesthesia Plan  ASA: III  Anesthesia Plan: Epidural   Post-op Pain Management:    Induction:   Airway Management Planned: Natural Airway  Additional Equipment:   Intra-op Plan:   Post-operative Plan:   Informed Consent: I have reviewed the patients History and Physical, chart, labs and discussed the procedure including the risks, benefits and alternatives for the proposed anesthesia with the patient or authorized representative who has indicated his/her understanding and acceptance.     Plan Discussed with: Anesthesiologist  Anesthesia Plan Comments:         Anesthesia Quick Evaluation

## 2013-06-17 NOTE — Progress Notes (Signed)
Rhonda Waller  was seen today for an ultrasound appointment.  See full report in [facility].  Impression: Single IUP at 38 0/7 weeks Pregestational diabetes, gestational hypertension The estimated fetal weight today is at the 81st %tile. Active fetus with BPP of 8/8 Normal amniotic fluid volume  BPs: 153/97, 164/100, 147/97   Recommendations: Patient was sent to MAU for further evaluation- rule out preeclampsia If BPs are presistently elvated, would recommend delivery  Discussed with Dr. Porfirio Mylar, MD

## 2013-06-18 ENCOUNTER — Encounter (HOSPITAL_COMMUNITY): Payer: Self-pay

## 2013-06-18 DIAGNOSIS — E119 Type 2 diabetes mellitus without complications: Secondary | ICD-10-CM

## 2013-06-18 DIAGNOSIS — O2432 Unspecified pre-existing diabetes mellitus in childbirth: Secondary | ICD-10-CM

## 2013-06-18 DIAGNOSIS — O139 Gestational [pregnancy-induced] hypertension without significant proteinuria, unspecified trimester: Secondary | ICD-10-CM

## 2013-06-18 LAB — GLUCOSE, CAPILLARY
Glucose-Capillary: 100 mg/dL — ABNORMAL HIGH (ref 70–99)
Glucose-Capillary: 108 mg/dL — ABNORMAL HIGH (ref 70–99)
Glucose-Capillary: 131 mg/dL — ABNORMAL HIGH (ref 70–99)
Glucose-Capillary: 131 mg/dL — ABNORMAL HIGH (ref 70–99)
Glucose-Capillary: 152 mg/dL — ABNORMAL HIGH (ref 70–99)
Glucose-Capillary: 170 mg/dL — ABNORMAL HIGH (ref 70–99)

## 2013-06-18 MED ORDER — SENNOSIDES-DOCUSATE SODIUM 8.6-50 MG PO TABS
2.0000 | ORAL_TABLET | ORAL | Status: DC
  Administered 2013-06-19: 2 via ORAL
  Filled 2013-06-18: qty 2

## 2013-06-18 MED ORDER — DIBUCAINE 1 % RE OINT: 1.0000 "application " | TOPICAL_OINTMENT | RECTAL | Status: DC | PRN

## 2013-06-18 MED ORDER — TETANUS-DIPHTH-ACELL PERTUSSIS 5-2.5-18.5 LF-MCG/0.5 IM SUSP: 0.5000 mL | Freq: Once | INTRAMUSCULAR | Status: DC

## 2013-06-18 MED ORDER — IBUPROFEN 600 MG PO TABS
600.0000 mg | ORAL_TABLET | Freq: Four times a day (QID) | ORAL | Status: DC
  Administered 2013-06-18 – 2013-06-19 (×5): 600 mg via ORAL
  Filled 2013-06-18 (×5): qty 1

## 2013-06-18 MED ORDER — PRENATAL MULTIVITAMIN CH
1.0000 | ORAL_TABLET | Freq: Every day | ORAL | Status: DC
  Administered 2013-06-18 – 2013-06-19 (×2): 1 via ORAL
  Filled 2013-06-18 (×2): qty 1

## 2013-06-18 MED ORDER — ONDANSETRON HCL 4 MG PO TABS: 4.0000 mg | ORAL_TABLET | ORAL | Status: DC | PRN

## 2013-06-18 MED ORDER — WITCH HAZEL-GLYCERIN EX PADS: 1.0000 "application " | MEDICATED_PAD | CUTANEOUS | Status: DC | PRN

## 2013-06-18 MED ORDER — OXYCODONE-ACETAMINOPHEN 5-325 MG PO TABS
1.0000 | ORAL_TABLET | ORAL | Status: DC | PRN
  Administered 2013-06-18: 1 via ORAL
  Filled 2013-06-18: qty 1

## 2013-06-18 MED ORDER — BENZOCAINE-MENTHOL 20-0.5 % EX AERO: 1.0000 "application " | INHALATION_SPRAY | CUTANEOUS | Status: DC | PRN

## 2013-06-18 MED ORDER — ZOLPIDEM TARTRATE 5 MG PO TABS: 5.0000 mg | ORAL_TABLET | Freq: Every evening | ORAL | Status: DC | PRN

## 2013-06-18 MED ORDER — DIPHENHYDRAMINE HCL 25 MG PO CAPS: 25.0000 mg | ORAL_CAPSULE | Freq: Four times a day (QID) | ORAL | Status: DC | PRN

## 2013-06-18 MED ORDER — ONDANSETRON HCL 4 MG/2ML IJ SOLN: 4.0000 mg | INTRAMUSCULAR | Status: DC | PRN

## 2013-06-18 MED ORDER — SIMETHICONE 80 MG PO CHEW: 80.0000 mg | CHEWABLE_TABLET | ORAL | Status: DC | PRN

## 2013-06-18 MED ORDER — LANOLIN HYDROUS EX OINT: TOPICAL_OINTMENT | CUTANEOUS | Status: DC | PRN

## 2013-06-18 NOTE — Progress Notes (Signed)
Patient's last two B/P's were 152/82 and 155/81. Denies PIH S&S. Patient's CBG this AM at 0745 was 170, not a true fasting, pt ate at 0500 after delivery(stated a ham sandwich). Her breakfast 2 hour PP was 131. Dr Jarvis Newcomer was notified of B/P and CBG's. Will continue to watch B/P and orders to call for CBG's 200 or greater.

## 2013-06-18 NOTE — Progress Notes (Signed)
Rhonda Waller is a 37 y.o. [id number] at [redacted]w[redacted]d admitted for induction of labor due to Hypertension.  Subjective: Comfortable with epidural.  Objective: BP 161/87  Pulse 84  Temp(Src) 98.9 F (37.2 C) (Oral)  Resp 18  Ht 5' (1.524 m)  Wt 99.791 kg (220 lb)  BMI 42.97 kg/m2  SpO2 98%   Total I/O In: 100 [I.V.:100] Out: -   FHT:  FHR: 140 bpm, variability: moderate,  accelerations:  Present,  decelerations:  Absent UC:   More regular than earlier, about every 5 minutes SVE:   Dilation: 6 Effacement (%): 90 Station: -1 Exam by:: Dr Casper Harrison  Labs: Lab Results  Component Value Date   WBC 7.0 06/17/2013   HGB 10.0* 06/17/2013   HCT 30.5* 06/17/2013   MCV 80.9 06/17/2013   PLT 207 06/17/2013    Assessment / Plan: IOL to gHTN, making some cervical change; AROM at 0104.  Labor: S/p Cytotec x1, s/p FB, now AROM. Continue Pitocin. gHTN:  intake and ouput balanced and labs stable; few isolated systolic pressures just over 160, monitor A2DM: On glucosestabilizer. Doing well.  Fetal Wellbeing:  Category I Pain Control:  Epidural I/D:  n/a Anticipated MOD:  NSVD  Bobbye Morton, MD PGY-2, [facility] 06/18/2013, 1:22 AM

## 2013-06-18 NOTE — Progress Notes (Signed)
UR chart review completed.  

## 2013-06-18 NOTE — Anesthesia Postprocedure Evaluation (Signed)
  Anesthesia Post-op Note  Patient: Rhonda Waller  Procedure(s) Performed: * No procedures listed *  Patient Location: PACU and Mother/Baby  Anesthesia Type:Epidural  Level of Consciousness: awake, alert  and oriented  Airway and Oxygen Therapy: Patient Spontanous Breathing  Post-op Pain: none  Post-op Assessment: Post-op Vital signs reviewed, Patient's Cardiovascular Status Stable, No headache, No backache, No residual numbness and No residual motor weakness  Post-op Vital Signs: Reviewed and stable  Complications: No apparent anesthesia complications

## 2013-06-19 LAB — GLUCOSE, CAPILLARY: Glucose-Capillary: 77 mg/dL (ref 70–99)

## 2013-06-19 MED ORDER — IBUPROFEN 600 MG PO TABS: 600.0000 mg | ORAL_TABLET | Freq: Four times a day (QID) | ORAL | Status: DC

## 2013-06-19 NOTE — H&P (Signed)
Attestation of Attending Supervision of Advanced Practitioner (CNM/NP): Evaluation and management procedures were performed by the Advanced Practitioner under my supervision and collaboration.  I have reviewed the Advanced Practitioner's note and chart, and I agree with the management and plan.  Flem Enderle 06/19/2013 9:42 AM   

## 2013-06-19 NOTE — Discharge Summary (Signed)
Obstetric Discharge Summary Reason for Admission: IOL for gHTN at term Prenatal Procedures: ultrasound Intrapartum Procedures: spontaneous vaginal delivery Postpartum Procedures: none Complications-Operative and Postpartum: none Hemoglobin  Date Value Range Status  06/17/2013 10.0* 12.0 - 15.0 g/dL Final     HCT  Date Value Range Status  06/17/2013 30.5* 36.0 - 46.0 % Final  Brief Hospital Course Rhonda Waller is a 37 y.o. now [id number] who had NSVD at [redacted]w[redacted]d after presenting with HTN. She had gestational HTN and A2DM on insulin, and was followed at [facility] since 28wks. She was started on glucose stabilizer protocol and labor was induced. Delivery was significant for 20sec shoulder dystocia, APGARs 8 and 9. HTN controlled during admission with few above 140/90. Fasting blood sugars were consistently around 100mg /dl and insulin was discontinued after delivery. Pt and baby doing well the following day, requesting discharge and were discharged on PPD#1 with plans to get mirena at 6 week postpartum visit.   Physical Exam:  General: alert, cooperative and no distress Lochia: appropriate Uterine Fundus: firm Incision: n/a DVT Evaluation: No evidence of DVT seen on physical exam. Negative Homan's sign. No significant calf/ankle edema.  Discharge Diagnoses: Term Pregnancy-delivered and A2DM  Discharge Information: Date: 06/19/2013 Activity: pelvic rest Diet: routine Medications: PNV and Ibuprofen Condition: stable Instructions: refer to practice specific booklet Discharge to: home  Follow-up Information   Follow up with [facility]. (You will be called to schedule an appointment in 6 weeks)    Specialty:  Obstetrics and Gynecology   Contact information:   [address] [place] [address] [phone]     Newborn Data: Live born female  Birth Weight: 7 lb 10.3 oz (3467 g) APGAR: 5, 9  Stable to discharge per peds, home with mother.  Rhonda Waller 06/19/2013, 11:33 AM  I have seen and examined this patient and agree with above documentation in the resident's note.   Rulon Abide, M.D. [facility] Fellow 06/19/2013 3:42 PM

## 2013-06-19 NOTE — Discharge Summary (Signed)
Attestation of Attending Supervision of Fellow: Evaluation and management procedures were performed by the Fellow under my supervision and collaboration.  I have reviewed the Fellow's note and chart, and I agree with the management and plan.    

## 2013-06-19 NOTE — MAU Provider Note (Signed)
Attestation of Attending Supervision of Advanced Practitioner (CNM/NP): Evaluation and management procedures were performed by the Advanced Practitioner under my supervision and collaboration.  I have reviewed the Advanced Practitioner's note and chart, and I agree with the management and plan.  Faron Tudisco 06/19/2013 9:42 AM

## 2013-06-24 ENCOUNTER — Inpatient Hospital Stay (HOSPITAL_COMMUNITY): Admission: RE | Admit: 2013-06-24 | Payer: Medicaid Other | Source: Ambulatory Visit

## 2013-07-31 ENCOUNTER — Encounter: Payer: Self-pay | Admitting: *Deleted

## 2013-08-12 ENCOUNTER — Encounter: Payer: Self-pay | Admitting: Family Medicine

## 2013-08-12 ENCOUNTER — Ambulatory Visit (INDEPENDENT_AMBULATORY_CARE_PROVIDER_SITE_OTHER): Payer: Medicaid Other | Admitting: Family Medicine

## 2013-08-12 VITALS — BP 150/98 | Temp 97.6°F | Wt 197.6 lb

## 2013-08-12 DIAGNOSIS — Z309 Encounter for contraceptive management, unspecified: Secondary | ICD-10-CM

## 2013-08-12 DIAGNOSIS — IMO0001 Reserved for inherently not codable concepts without codable children: Secondary | ICD-10-CM

## 2013-08-12 DIAGNOSIS — I10 Essential (primary) hypertension: Secondary | ICD-10-CM

## 2013-08-12 DIAGNOSIS — E119 Type 2 diabetes mellitus without complications: Secondary | ICD-10-CM

## 2013-08-12 DIAGNOSIS — R030 Elevated blood-pressure reading, without diagnosis of hypertension: Secondary | ICD-10-CM

## 2013-08-12 MED ORDER — NORETHINDRONE 0.35 MG PO TABS: 1.0000 | ORAL_TABLET | Freq: Every day | ORAL | Status: DC

## 2013-08-12 MED ORDER — LISINOPRIL 20 MG PO TABS: 20.0000 mg | ORAL_TABLET | Freq: Every day | ORAL | Status: DC

## 2013-08-12 MED ORDER — BENAZEPRIL HCL 10 MG PO TABS: 10.0000 mg | ORAL_TABLET | Freq: Every day | ORAL | Status: DC

## 2013-08-12 MED ORDER — NIFEDIPINE ER 30 MG PO TB24: 30.0000 mg | ORAL_TABLET | Freq: Every day | ORAL | Status: DC

## 2013-08-12 NOTE — Progress Notes (Signed)
Patient plans to go to health dept for birthcontrol.

## 2013-08-12 NOTE — Progress Notes (Signed)
Patient ID: Rhonda Waller Haese, female   DOB: [birth year], 38 y.o.   MRN: [id number] Subjective:     Rhonda Waller is a 38 y.o. female who presents for a postpartum visit. She is 6 weeks postpartum following a spontaneous vaginal delivery. I have fully reviewed the prenatal and intrapartum course. The delivery was at 38 gestational weeks. Outcome: spontaneous vaginal delivery. Anesthesia: epidural. Postpartum course has been uncomplicated. Baby's course has been uncomplicated. Baby is feeding by breast. Bleeding no bleeding. Bowel function is normal. Bladder function is normal. Patient is not sexually active. Contraception method is IUD, Pt is pending medicaid and would like to go to the HD to place IUD, requests Pills prior to IUD placement. Postpartum depression screening: negative.  The following portions of the patient's history were reviewed and updated as appropriate: allergies, current medications, past family history, past medical history, past social history, past surgical history and problem list.  Review of Systems Pertinent items are noted in HPI.   Objective:    BP 150/98  Temp(Src) 97.6 F (36.4 C) (Oral)  Wt 89.631 kg (197 lb 9.6 oz)  Breastfeeding? Yes  General:  alert, cooperative, appears stated age and no distress   Breasts:  Pt declined - neg ROS  Lungs: clear to auscultation bilaterally and normal percussion bilaterally  Heart:  regular rate and rhythm, S1, S2 normal, no murmur, click, rub or gallop and normal apical impulse  Abdomen: soft, non-tender; bowel sounds normal; no masses,  no organomegaly        Assessment:     6 postpartum exam. Pap smear not done at today's visit.   Plan:    1. Contraception: desires IUD - but no insuracne yet, will give POP - low milk supply 2. LSIL - needs colpo - no insurance, pending medicade will have pt follow up in 45days for colpo and IUD insertion. 3. Follow up in: 7 weeks or as needed.  4. DM2: Pt with DM2 previously  on insulin. Not currently taking her insulin, previously on Metformin 500mg  BID in 2011. Will reeval sugars in 1 week for glucose control and likely restart metformin 500mg  BID. Pt has had fluctuating control over last 3 years.  5. GHTN likely chronic - will start on benazepril 10mg  daily (reviewed in lactmed and appears safe in breast feeding)  Tawana ScaleMichael Ryan Hershall Benkert, MD OB Fellow

## 2013-08-12 NOTE — Patient Instructions (Addendum)
Ejercicios para la espalda  (Back Exercises)  Estos ejercicios lo ayudarán al comienzo de la rehabilitación. Los síntomas podrán aliviarse con o sin asistencia adicional de su médico, fisioterapeuta o entrenador. Al completar estos ejercicios, recuerde:   · Restaurar la flexibilidad del tejido ayuda a que las articulaciones recuperen el movimiento normal. Esto permite que el movimiento y la actividad sean más saludables y menos dolorosos.  · Para que sea efectiva, cada elongación debe realizarse durante al menos 30 segundos.  · La elongación nunca debe ser dolorosa. Deberá sentir sólo un alargamiento o distensión suave del tejido que estira.  ELONGACIÓN Extensión posición prona sobre los codos  · Acuéstese boca abajo sobre el piso, una cama será muy blanda. Coloque las palmas a una distancia igual al ancho de los hombros y a la altura de la cabeza.  · Coloque los codos bajo los hombros. Si siente dolor, colóquese almohadas debajo del pecho.  · Deje que su cuerpo se relaje, de modo que las caderas queden más abajo y tengan más contacto con el piso.  · Mantenga esta posición durante __________ segundos.  · Vuelva lentamente a la posición plana sobre el piso.  Repítalo __________ veces. Realice este ejercicio __________ veces por día.   AMPLITUD DE MOVIMIENTOS - Extensión - flexión de brazos en posición prona  · Acuéstese boca abajo sobre el piso, una cama será muy blanda. Coloque las palmas a una distancia igual al ancho de los hombros y a la altura de la cabeza.  · Mantenga la espalda tan relajada como pueda, enderece lentamente los codos mientras mantiene las caderas contra el suelo. Puede modificar la posición de las manos para estar más cómodo. A medida que gana movimiento, sus manos quedarán más por debajo de los hombros.  · Mantenga cada posición durante __________ segundos.  · Vuelva lentamente a la posición plana sobre el piso.  Repítalo __________ veces. Realice este ejercicio __________ veces por día.      AMPLITUD DE MOVIMIENTOS Cuadrúpedo [place] vertebral neutral  · Coloque las manos y las rodillas en una superficie firme. Las manos deben quedar a la altura de los hombros y las rodillas debajo de las caderas. Puede colocar algo debajo las rodillas para estar más cómodo.  · Deje caer la cabeza y apunte el cóccix hacia el piso. De este modo se redondeará la cintura, en una postura similar a un gato enojado. Mantenga esta posición durante __________ segundos.  · Levante lentamente la cabeza y relaje el cóccix, de modo que su espalda forme un gran arco, como si fuera un caballo viejo.  · Mantenga esta posición durante __________ segundos.  · Repítalo hasta sentir calor en la cintura.  · Ahora encuentre su "punto ideal". Será la posición más cómoda entre las dos posiciones anteriores. En esta posición es cuando su columna está neutral. Una vez que encuentre la posición, tensione los músculos del estómago para sostener la zona inferior de la espalda.  · Mantenga esta posición durante __________ segundos.  Repítalo __________ veces. Realice este ejercicio __________ veces por día.   ELONGACION Flexión - una rodilla al pecho   · Recuéstese en una cama dura o sobre el piso, con ambas piernas extendidas al frente.  · Manteniendo una pierna en contacto con el piso, lleve la rodilla opuesta al pecho. Mantenga la pierna en esa posición, sosteniéndola por la zona posterior del muslo o por la rodilla.  · Presione hasta sentir un suave estiramiento en la cintura. Mantenga esta posición durante __________   pie o sentado, extienda la pierna derecha / izquierdo, colocando el pie en una silla o banco.  Doble ligeramente la espalda y las caderas, formando un arco suave hacia adelante.  Lleve el pecho hacia adelante hasta sentir un  ligero estiramiento en la zona posterior de la rodilla o muslo derecha / izquierdo. (Si se realiza correctamente, este ejercicio requiere inclinarse muy poco hacia adelante).  Mantenga esta posicin durante __________ segundos. Reptalo __________ veces. Realice este estiramiento __________ Anthoney Harada por da. FORTALECIMIENTO - Abdominales profundos - Inclinacin plvica  Recustese sobre una cama firme o sobre el suelo. Mantenga las piernas al frente, doble las rodillas de modo que ambas apunten hacia el techo y los pies queden bien apoyados en el piso.  Tensione la zona baja de los msculos abdominales para presionar la Oncologist. Este movimiento har rotar su pelvis de modo que el cccix quede hacia arriba y no apuntando a los pies o hacia el piso.  Con una tensin suave y respiracin pareja, mantenga esta posicin durante __________ segundos. Reptalo __________ veces. Realice este ejercicio __________ veces por da.  FORTALECIMIENTO Abdominales encogimiento abdominal.  Recustese sobre una cama firme o sobre el suelo. Mantenga las piernas al frente, doble las rodillas de modo que ambas apunten hacia el techo y los pies queden bien apoyados en el piso. Cruce las [employer].  Apunte suavemente con la barbilla hacia abajo, sin doblar el cuello.  Tensione los abdominales y eleve lentamente el tronco la altura suficiente para despegar los omplatos. Si se eleva ms, pondr tensin excesiva en la cintura y esto no fortalecer ms los abdominales.  Controle la vuelta a la posicin inicial. Reptalo __________ veces. Realice este ejercicio __________ veces por da.  EN CUATRO MIEMBROS - Elevacin de miembro superior e inferior opuestos  [employer] y las rodillas en una superficie firme. Las manos deben quedar a la altura de los hombros y las rodillas debajo de las caderas. Puede colocar algo debajo las rodillas para estar ms cmodo.  Encuentre la posicin neutral de  la columna vertebral y Public house manager los msculos abdominales de modo que pueda mantener esta posicin. Los hombros y las caderas deben formar un rectngulo paralelo con el suelo y sin curvas.  Manteniendo el tronco firme, eleve la mano derecha a la altura del hombro y luego eleve la pierna izquierda a la altura de la cadera. Asegrese de no contener la respiracin. Mantenga cada posicin durante __________ segundos.  Con los msculos abdominales en tensin y la espalda firme, vuelva lentamente a la posicin inicial. Repita con el otro brazo y la otra pierna. Reptalo __________ veces. Realice este ejercicio __________ veces por da. Document Released: 01/02/2006 Document Revised: 10/03/2011 [facility] Patient Information 2014 [place], [place].   Emergency Department Resource Guide 1) Find a Doctor and Pay Out of Pocket Although you won't have to find out who is covered by your insurance plan, it is a good idea to ask around and get recommendations. You will then need to call the office and see if the doctor you have chosen will accept you as a new patient and what types of options they offer for patients who are self-pay. Some doctors offer discounts or will set up payment plans for their patients who do not have insurance, but you will need to ask so you aren't surprised when you get to your appointment.  2) Contact Your Local Health Department Not all health departments have doctors that can see patients  for sick visits, but many do, so it is worth a call to see if yours does. If you don't know where your local health department is, you can check in your phone book. The CDC also has a tool to help you locate your state's health department, and many state websites also have listings of all of their local health departments.  3) Find a Walk-in Clinic If your illness is not likely to be very severe or complicated, you may want to try a walk in clinic. These are popping up all over the country  in pharmacies, drugstores, and shopping centers. They're usually staffed by nurse practitioners or physician assistants that have been trained to treat common illnesses and complaints. They're usually fairly quick and inexpensive. However, if you have serious medical issues or chronic medical problems, these are probably not your best option.  No Primary Care Doctor: - Call Health Connect at  [phone] - they can help you locate a primary care doctor that  accepts your insurance, provides certain services, etc. - Physician Referral Service- [phone]  Chronic Pain Problems: Organization         Address  Phone   Notes  [facility]  [phone] Patients need to be referred by their primary care doctor.   Medication Assistance: Organization         Address  Phone   Notes  [facility] Medication [facility] [address] [place] [place], [place] [address] [phone] --Must be a resident of [facility] -- Must have NO insurance coverage whatsoever (no Medicaid/ Medicare, etc.) -- The pt. MUST have a primary care doctor that directs their care regularly and follows them in the community   MedAssist  [phone]   [employer]  [phone]    Agencies that provide inexpensive medical care: Organization         Address  Phone   Notes  [facility]  [phone]   [facility]    [phone]   [facility] [address] [place], [place] [address] [phone]   [facility] [address] [place] [place] [phone]   Planned Parenthood    [phone]   [facility]    [phone]   [facility]  [address], [place] Phone:  [phone], Fax:  [phone] Hours of Operation:  9 am - 6 pm, M-F.  Also accepts Medicaid/Medicare and self-pay.  [facility]  [address],  [place] Phone: [phone], Fax: [phone]. Hours of Operation:  8:30 am - 5:30 pm, M-F.  Also accepts Medicaid and self-pay.  [facility] [place] [address], [place] Phone: [phone]   [facility] [address] Natasha Bence [place], [place] [phone], Ext. [phone] Mondays & Thursdays: 7-9 AM.  First 15 patients are seen on a first come, first serve basis.    Medicaid-accepting [facility] Providers:  Organization         Address  Phone   Notes  [facility] [address], [place] [phone] Also accepts self-pay patients.  [facility] [address] Laurell Josephs [place], [place]  [phone]   [facility] [address], [place] [phone]   [facility] [address] [place] [phone])  [phone]   Renaye Rakers [address], [place]   [phone] Only accepts [place] patients after they have their name applied to their card.   Self-Pay (no insurance) in [facility]:  Organization         Address  Phone   Notes  Sickle Cell Patients, [facility] [address] [place], [place] [phone]   [facility] [address] [place], [place] [phone]   [facility] [place]  [address], [place] [phone]   [facility]/Dr. Osei-Bonsu  [address] [place] or [address], [place] [phone] Phone number for both [place] and [place] locations is the same.  [facility] [address], [place] [phone]   [facility] [address] [place] or [address] [phone]   [facility] [address], [place] [phone], phone; [phone], fax Sees patients 1st and 3rd Saturday of every month.  Must not  qualify for public or private insurance (i.e. Medicaid, Medicare, [place] Health Choice, Veterans' Benefits) . Household income should be no more than 200% of the poverty level .The clinic cannot treat you if you are pregnant or think you are pregnant . Sexually transmitted diseases are not treated at the clinic.    Dental Care: Organization         Address  Phone  Notes  [facility] Department of [facility] [address] [place], [place] [phone] Accepts children up to age 25 who are enrolled in [place] or [place] Health Choice; pregnant women with a Medicaid card; and children who have applied for Medicaid or [place] Health Choice, but were declined, whose parents can pay a reduced fee at time of service.  [facility] of [facility]  [address], [place] [phone] Accepts children up to age 11 who are enrolled in [place] or [place] Health Choice; pregnant women with a Medicaid card; and children who have applied for Medicaid or [place] Health Choice, but were declined, whose parents can pay a reduced fee at time of service.  [facility]  [address] [place], [place] [phone] Patients are seen by appointment only. Walk-ins are not accepted. [facility] will see patients 29 years of age and older. Monday - Tuesday (8am-5pm) Most Wednesdays (8:30-5pm) $30 per visit, cash only  [facility] Access PRO[facility]  [address], [facility] [phone] Patients are seen by appointment only. Walk-ins are not accepted. [facility] will see patients 31 years of age and older. One Wednesday Evening (Monthly: Volunteer Based).  $30 per visit, cash only  [employer]  [phone] for adults; Children under age 57, call Graduate Pediatric Dentistry at [phone]. Children aged 16-14, please call [phone] to request a pediatric application.  Dental services are provided  in all areas of dental care including fillings, crowns and bridges, complete and partial dentures, implants, gum treatment, root canals, and extractions. Preventive care is also provided. Treatment is provided to both adults and children. Patients are selected via a lottery and there is often a waiting list.   [facility] [address], [place]  [phone] [email or web address]   [facility] [address] [place],  [place] [phone], Ext. [phone] Second and Fourth Thursday of each month, opens at 6:30 AM; Clinic ends at 9 AM.  Patients are seen on a first-come first-served basis, and a limited number are seen during each clinic.   [facility]  [address] Ether [place]ins [place], [place] [phone]   Eligibility Requirements You must have lived in [place], [place], or [place] counties for at least the last three months.   You cannot be eligible for state or federal sponsored [place], including [employer], [place], or [employer].   You generally cannot be eligible for healthcare insurance through your employer.    How to apply: Eligibility screenings are held every Tuesday and Wednesday afternoon from 1:00 pm until 4:00 pm. You do not need an appointment for the interview!  [facility] [address], [place], [place] [phone]   [facility] Health Department  [phone]   [facility] Health Department  [phone]   [facility]  [phone]    Behavioral Health Resources in the Community: Intensive Outpatient Programs Organization         Address  Phone  Notes  [facility] [address] [place], [place] [phone]   [facility] [address] [place], [place] [phone]   [facility] [address], [place], [place]  [phone]   [facility] Mental Health [address],  [place], [place]  [phone] or [phone]   Substance Abuse Resources Organization         Address  Phone  Notes  [facility] Drug Services  [phone]   [facility]  [phone]   [facility] [place]  [phone]   Floydene Flock  [phone]   Residential & Outpatient Substance Abuse Program  [phone]   Psychological Services Organization         Address  Phone  Notes  [facility]al Health  [phone]   [facility]  [phone]   [facility] Mental Health [address] [place] [phone] or [phone]    Mobile Crisis Teams Organization         Address  Phone  Notes  [facility], Mobile Crisis Care Unit  [phone]   [facility]  [address] [place], [place] [phone]   Doristine Locks [address] [place] [phone]    Self-Help/Support Groups Organization         Address  Phone             Notes  [facility] [place] - variety of support groups  [phone]- [phone] Call for more information  Narcotics Anonymous (NA), [facility] [address], [employer] [place]  2 meetings at this location   Statistician         Address  Phone  Notes  [facility] [address],    [place] [place]  [phone]   [facility]  [address], [place] [address], [place], [place] [phone]   [facility] [address] [place], [place] [phone] Admissions: 8am-3pm M-F  [facility] [address]    [place], [place] [phone]   [facility] [address] Starling Manns [place], [place] [phone]   [facility] [address]  [place], [place] [phone]   [facility] - Intensive Outpatient [address] Laurell Josephs [address], [place], [place] [phone]   [facility] ([facility]iction  [facility] [address]  [place], [place] [phone] or  [phone]   [facility] ([facility]) [address] [place], [place] [phone] Accepts Medicaid  [facility] [address]  [place] [phone] Substance Abuse/Addiction Treatment   [facility] Behavioral Health Resources Organization         Address  Phone  Notes  [facility]  [phone](888) [phone]   Angie FavaJulie Brannon, PhD [address] [place], [place]   [phone]) [phone] or [phone]) [phone]   [facility]   [address] [place], [place] [phone]) [phone]   [facility] [address], [place], [place] [phone]) [phone] Insurance/Medicaid/sponsorship through [facility]  [facility] [address]                                    [place], [place] [phone]) [phone] Therapy/tele-psych/case  [facility] [address].   [place], [place] [phone]) [phone]    Dr. Lolly MustacheArfeen  [phone]   [facility] [place]  [employer] [facility] 1) [address], [place] 2) [address], [place] 3)  [address], [place] [phone]  [phone]) [phone]   [facility] Child Abuse Hotline [phone]) [phone] or [phone]) [phone] (After Hours)

## 2013-08-20 ENCOUNTER — Other Ambulatory Visit: Payer: Self-pay

## 2013-08-30 ENCOUNTER — Telehealth (HOSPITAL_COMMUNITY): Payer: Self-pay | Admitting: *Deleted

## 2013-08-30 NOTE — Telephone Encounter (Signed)
Telephoned patient at home # and left message to return call to [facility] 

## 2013-09-06 ENCOUNTER — Telehealth (HOSPITAL_COMMUNITY): Payer: Self-pay | Admitting: *Deleted

## 2013-09-06 NOTE — Telephone Encounter (Signed)
Telephoned patient at home # and no answer. 

## 2013-09-11 ENCOUNTER — Telehealth (HOSPITAL_COMMUNITY): Payer: Self-pay | Admitting: *Deleted

## 2013-09-11 NOTE — Telephone Encounter (Signed)
Telephoned patient at home # and left message to return call to [facility]. Used interpreter Julie Sowell. 

## 2013-09-20 ENCOUNTER — Ambulatory Visit: Payer: Self-pay | Admitting: Obstetrics & Gynecology

## 2013-10-15 ENCOUNTER — Ambulatory Visit (HOSPITAL_COMMUNITY): Payer: Self-pay

## 2013-10-21 ENCOUNTER — Telehealth: Payer: Self-pay | Admitting: *Deleted

## 2013-10-21 ENCOUNTER — Encounter: Payer: Self-pay | Admitting: Family Medicine

## 2013-10-21 ENCOUNTER — Encounter: Payer: Self-pay | Admitting: *Deleted

## 2013-10-21 NOTE — Telephone Encounter (Signed)
Rhonda Waller missed her appointment for colposcopy. Called her with interpreter- phone disconnected. Will send letter.

## 2013-10-24 ENCOUNTER — Encounter: Payer: Self-pay | Admitting: *Deleted

## 2013-10-24 ENCOUNTER — Telehealth: Payer: Self-pay | Admitting: *Deleted

## 2013-10-24 ENCOUNTER — Encounter: Payer: Self-pay | Admitting: Obstetrics & Gynecology

## 2013-10-24 NOTE — Telephone Encounter (Signed)
Called patient, phone will not let you leave a message, States "this line is not accepting calls",  Will Send letter.

## 2013-12-12 ENCOUNTER — Encounter: Payer: Self-pay | Admitting: General Practice

## 2013-12-20 ENCOUNTER — Encounter: Payer: Self-pay | Admitting: General Practice

## 2014-03-14 ENCOUNTER — Encounter: Payer: Self-pay | Admitting: General Practice

## 2014-05-26 ENCOUNTER — Encounter: Payer: Self-pay | Admitting: Family Medicine

## 2015-06-17 ENCOUNTER — Inpatient Hospital Stay (HOSPITAL_COMMUNITY): Payer: Self-pay

## 2015-06-17 ENCOUNTER — Inpatient Hospital Stay (HOSPITAL_COMMUNITY)
Admission: AD | Admit: 2015-06-17 | Discharge: 2015-06-17 | Disposition: A | Discharge status: Home / Self Care | Payer: Self-pay | Source: Ambulatory Visit | Attending: Family Medicine | Admitting: Family Medicine

## 2015-06-17 ENCOUNTER — Encounter (HOSPITAL_COMMUNITY): Payer: Self-pay

## 2015-06-17 DIAGNOSIS — I10 Essential (primary) hypertension: Secondary | ICD-10-CM | POA: Diagnosis present

## 2015-06-17 DIAGNOSIS — O021 Missed abortion: Secondary | ICD-10-CM | POA: Diagnosis present

## 2015-06-17 DIAGNOSIS — O24919 Unspecified diabetes mellitus in pregnancy, unspecified trimester: Secondary | ICD-10-CM | POA: Diagnosis present

## 2015-06-17 DIAGNOSIS — E119 Type 2 diabetes mellitus without complications: Secondary | ICD-10-CM | POA: Insufficient documentation

## 2015-06-17 DIAGNOSIS — O24911 Unspecified diabetes mellitus in pregnancy, first trimester: Secondary | ICD-10-CM

## 2015-06-17 DIAGNOSIS — Z3A13 13 weeks gestation of pregnancy: Secondary | ICD-10-CM | POA: Insufficient documentation

## 2015-06-17 DIAGNOSIS — O209 Hemorrhage in early pregnancy, unspecified: Secondary | ICD-10-CM

## 2015-06-17 LAB — HCG, QUANTITATIVE, PREGNANCY: hCG, Beta Chain, Quant, S: 669 m[IU]/mL — ABNORMAL HIGH (ref <5)

## 2015-06-17 LAB — CBC WITH DIFFERENTIAL/PLATELET
Basophils Absolute: 0 10*3/uL (ref 0.0–0.1)
Basophils Relative: 0 %
Eosinophils Absolute: 0.1 10*3/uL (ref 0.0–0.7)
Eosinophils Relative: 2 %
HEMATOCRIT: 38.3 % (ref 36.0–46.0)
Hemoglobin: 12.9 g/dL (ref 12.0–15.0)
LYMPHS ABS: 1.4 10*3/uL (ref 0.7–4.0)
LYMPHS PCT: 26 %
MCH: 29.7 pg (ref 26.0–34.0)
MCHC: 33.7 g/dL (ref 30.0–36.0)
MCV: 88.2 fL (ref 78.0–100.0)
MONO ABS: 0.2 10*3/uL (ref 0.1–1.0)
Monocytes Relative: 4 %
Neutro Abs: 3.6 10*3/uL (ref 1.7–7.7)
Neutrophils Relative %: 68 %
PLATELETS: 214 10*3/uL (ref 150–400)
RBC: 4.34 MIL/uL (ref 3.87–5.11)
RDW: 13.2 % (ref 11.5–15.5)
WBC: 5.3 10*3/uL (ref 4.0–10.5)

## 2015-06-17 LAB — GLUCOSE, CAPILLARY: Glucose-Capillary: 250 mg/dL — ABNORMAL HIGH (ref 65–99)

## 2015-06-17 LAB — WET PREP, GENITAL
SPERM: NONE SEEN
Trich, Wet Prep: NONE SEEN
Yeast Wet Prep HPF POC: NONE SEEN

## 2015-06-17 LAB — URINALYSIS, ROUTINE W REFLEX MICROSCOPIC
Bilirubin Urine: NEGATIVE
Glucose, UA: >1000 mg/dL — AB
KETONES UR: NEGATIVE mg/dL
LEUKOCYTES UA: NEGATIVE
NITRITE: NEGATIVE
PH: 6 (ref 5.0–8.0)
PROTEIN: NEGATIVE mg/dL
Specific Gravity, Urine: >1.03 — ABNORMAL HIGH (ref 1.005–1.030)

## 2015-06-17 LAB — HIV ANTIBODY (ROUTINE TESTING W REFLEX): HIV SCREEN 4TH GENERATION: NONREACTIVE

## 2015-06-17 LAB — POCT PREGNANCY, URINE: Preg Test, Ur: POSITIVE — AB

## 2015-06-17 LAB — URINE MICROSCOPIC-ADD ON

## 2015-06-17 LAB — RPR: RPR: NONREACTIVE

## 2015-06-17 MED ORDER — IBUPROFEN 600 MG PO TABS: 600.0000 mg | ORAL_TABLET | Freq: Four times a day (QID) | ORAL | Status: DC | PRN

## 2015-06-17 NOTE — MAU Note (Signed)
Patient presents with onset of abdominal pain and vaginal bleeding x 3 days, LMP 03/16/15

## 2015-06-17 NOTE — H&P (Signed)
  Rhonda Waller is an 39 y.o. [id number] female.   Chief Complaint: Vaginal bleeding HPI: Here on 11/23 with bleeding. U/s shows 12 wk CRL with absent fetal heart rate activity.  Past Medical History  Diagnosis Date  . Pregnancy induced hypertension   . Diabetes mellitus without complication (HCC) 2012    Past Surgical History  Procedure Laterality Date  . Cholecystectomy      Family History  Problem Relation Age of Onset  . Diabetes Mother   . Diabetes Maternal Uncle    Social History:  reports that she has never smoked. She has never used smokeless tobacco. She reports that she does not drink alcohol or use illicit drugs.  Allergies: No Known Allergies  No current facility-administered medications on file prior to encounter.   Current Outpatient Prescriptions on File Prior to Encounter  Medication Sig Dispense Refill  . ibuprofen (ADVIL,MOTRIN) 600 MG tablet Take 1 tablet (600 mg total) by mouth every 6 (six) hours as needed for moderate pain. 40 tablet 0    A comprehensive review of systems was negative.  Last menstrual period 03/16/2015, currently breastfeeding. General appearance: alert, cooperative, appears stated age and moderately obese Head: Normocephalic, without obvious abnormality, atraumatic Neck: supple, symmetrical, trachea midline Lungs: normal effort Heart: regular rate and rhythm Abdomen: soft, non-tender; bowel sounds normal; no masses,  no organomegaly Skin: Skin color, texture, turgor normal. No rashes or lesions Neurologic: Grossly normal   Lab Results  Component Value Date   WBC 5.3 06/17/2015   HGB 12.9 06/17/2015   HCT 38.3 06/17/2015   MCV 88.2 06/17/2015   PLT 214 06/17/2015   Lab Results  Component Value Date   PREGTESTUR POSITIVE* 06/17/2015   PREGSERUM NEG 08/17/2010     Assessment/Plan Principal Problem:   Abortion, missed  For Suction D & C. Risks include but are not limited to bleeding, infection, injury to  surrounding structures, including bowel, bladder and ureters, blood clots, and death.  Likelihood of success is high.    Javari Bufkin S 06/17/2015, 1:40 PM   \

## 2015-06-17 NOTE — Discharge Instructions (Signed)
La diabetes mellitus y los alimentos (Diabetes Mellitus and Food) Es importante que controle su nivel de azcar en la sangre (glucosa). El nivel de glucosa en sangre depende en gran medida de lo que usted come. Comer alimentos saludables en las cantidades [place] a lo largo del Training and development officer, aproximadamente a la misma hora [employer], lo ayudar a Chief Technology Officer su nivel de Multimedia programmer. Tambin puede ayudarlo a retrasar o Patent attorney de la diabetes mellitus. Comer de Affiliated Computer Services saludable incluso puede ayudarlo a Chartered loss adjuster de presin arterial y a Science writer o Theatre manager un peso saludable.  Entre las recomendaciones generales para alimentarse y Audiological scientist los alimentos de forma saludable, se incluyen las siguientes:  Respetar las comidas principales y comer colaciones con regularidad. Evitar pasar largos perodos sin comer con el fin de perder peso.  Seguir una dieta que consista principalmente en alimentos de origen vegetal, como frutas, vegetales, frutos secos, legumbres y cereales integrales.  Utilizar mtodos de coccin a baja temperatura, como hornear, en lugar de mtodos de coccin a alta temperatura, como frer en abundante aceite. Trabaje con el nutricionista para aprender a Financial planner nutricional de las etiquetas de los alimentos. CMO PUEDEN AFECTARME LOS ALIMENTOS? Carbohidratos Los carbohidratos afectan el nivel de glucosa en sangre ms que cualquier otro tipo de alimento. El nutricionista lo ayudar a Teacher, adult education cuntos carbohidratos puede consumir en cada comida y ensearle a contarlos. El recuento de carbohidratos es importante para mantener la glucosa en sangre en un nivel saludable, en especial si utiliza insulina o toma determinados medicamentos para la diabetes mellitus. Alcohol El alcohol puede provocar disminuciones sbitas de la glucosa en sangre (hipoglucemia), en especial si utiliza insulina o toma determinados medicamentos para la diabetes mellitus. La  hipoglucemia es una afeccin que puede poner en peligro la vida. Los sntomas de la hipoglucemia (somnolencia, mareos y Data processing manager) son similares a los sntomas de haber consumido mucho alcohol.  Si el mdico lo autoriza a beber alcohol, hgalo con moderacin y siga estas pautas:  Las mujeres no deben beber ms de un trago por da, y los hombres no deben beber ms de dos tragos por Training and development officer. Un trago es igual a:  12 onzas (355 ml) de cerveza  5 onzas de vino (150 ml) de vino  1,5onzas (37m) de bebidas espirituosas  No beba con el estmago vaco.  Mantngase hidratado. Beba agua, gaseosas dietticas o t helado sin azcar.  Las gaseosas comunes, los jugos y otros refrescos podran contener muchos carbohidratos y se dCivil Service fast streamer QU ALIMENTOS NO SE RECOMIENDAN? Cuando haga las elecciones de alimentos, es importante que recuerde que todos los alimentos son distintos. Algunos tienen menos nutrientes que otros por porcin, aunque podran tener la misma cantidad de caloras o carbohidratos. Es difcil darle al cuerpo lo que necesita cuando consume alimentos con menos nutrientes. Estos son algunos ejemplos de alimentos que debera evitar ya que contienen muchas caloras y carbohidratos, pero pocos nutrientes:  GPhysicist, medicaltrans (la mayora de los alimentos procesados incluyen grasas trans en la etiqueta de Informacin nutricional).  Gaseosas comunes.  Jugos.  Caramelos.  Dulces, como tortas, pasteles, rosquillas y gSt. Martin  Comidas fritas. QU ALIMENTOS PUEDO COMER? Consuma alimentos ricos en nutrientes, que nutrirn el cuerpo y lo mantendrn saludable. Los alimentos que debe comer tambin dependern de varios factores, como:  Las caloras que necesita.  Los medicamentos que toma.  Su peso.  El nivel de glucosa en s[place]  El nSpencerde presin arterial.  El nivel de colesterol.  Debe consumir una amplia variedad de alimentos, por ejemplo:  Protenas.  Cortes de [employer].  Protenas con bajo contenido de grasas saturadas, como pescado, clara de huevo y frijoles. Evite las carnes procesadas.  Frutas y vegetales.  Frutas y Photographer que pueden ayudar a Chief Technology Officer los niveles sanguneos de [place], como [place], mangos y batatas.  Productos lcteos.  Elija productos lcteos sin grasa o con bajo contenido de [place], como [place], yogur y [place].  Cereales, panes, pastas y arroz.  Elija cereales integrales, como panes multicereales, avena en grano y arroz integral. Estos alimentos pueden ayudar a controlar la presin arterial.  Daphene Jaeger.  Alimentos que contengan grasas saludables, como frutos secos, Musician, aceite de [place], aceite de canola y pescado. TODOS LOS QUE PADECEN DIABETES MELLITUS TIENEN EL [place] PLAN DE [place]? Dado que todas las personas que padecen diabetes mellitus son distintas, no hay un solo plan de comidas que funcione para todos. Es muy importante que se rena con un nutricionista que lo ayudar a crear un plan de comidas adecuado para usted.   Esta informacin no tiene Marine scientist el consejo del mdico. Asegrese de hacerle al mdico cualquier pregunta que tenga.   Document Released: 10/18/2007 Document Revised: 08/01/2014 Elsevier Interactive Patient Education 2016 [place] y curetaje o curetaje por aspiracin (Dilation and Curettage or Vacuum Curettage) La dilatacin y el curetaje por aspiracin son procedimientos menores. Consiste en la apertura (dilatacin) del cuello del tero y el raspado (curetaje) en la superficie interna del tero. Durante este procedimiento, el tejido del interior del tero se raspa suavemente. Durante el curetaje por aspiracin, se utiliza una succin suave para extirpar tejidos del tero.  El curetaje podr realizarse para diagnstico o para tratar un problema. Como procedimiento diagnstico, se realiza para examinar los tejidos del tero. Un diagnstico con curetaje se realiza en  caso de presentarse los siguientes sntomas:   Sangrado irregular en el tero.  Hemorragias con cogulos.  Prdida de Microsoft perodos [place].  Perodos menstruales prolongados.  Sangrado luego de la menopausia.  Falta de periodo menstrual (amenorrea).  Cambio en el tamao y forma del tero. Entergy Corporation, el curetaje podr realizarse por las siguientes causas:   Remocin del DIU (dispositivo intrauterino).  Remocin de placenta retenida luego del parto. La placenta retenida puede causar una hemorragia lo suficientemente grave como para requerir transfusiones o Astronomer una infeccin.  Aborto.  Aborto espontneo.  Extirpacin de plipos en el interior del tero.  Extirpacin de fibromas no frecuentes (bultos no cancerosos). INFORME A SU MDICO:   Cualquier alergia que tenga.  Todos los [employer] [place], incluyendo vitaminas, hierbas, gotas oftlmicas, cremas y medicamentos de venta libre.  Problemas previos que usted o los UnitedHealth de su familia hayan tenido con el uso de anestsicos.  Enfermedades de [employer].  Cirugas previas.  Padecimientos mdicos. RIESGOS Y COMPLICACIONES  Generalmente es un procedimiento seguro. Sin embargo, Games developer procedimiento, pueden surgir complicaciones. Las complicaciones posibles son:  Elvina Mattes.  Infeccin en el tero.  Lesin en el cuello del tero.  Desarrollo de tejido Pensions consultant (adherencias) dentro del tero que posteriormente causan hemorragia menstrual en cantidad anormal.  Complicaciones de la anestesia general, si se ha usado.  Perforacin del tero. Esto es raro. ANTES DEL PROCEDIMIENTO   Coma y beba antes del procedimiento slo lo que le indique el mdico.  Arregle con alguna persona para que la lleve a su casa. PROCEDIMIENTO  El procedimiento demora entre 15  y 21 minutos.  Podrn administrarle uno de los siguientes medicamentos:  Medicamentos que adormecen el  rea del cuello uterino (anestesia local).  Un medicamento para que duerma durante el procedimiento (anestesia general).  Deber recostarse sobre la espalda con las piernas en los estribos.  Le colocarn en la vagina un instrumento metlico o plstico entibiado espculo) para [place] y permitir al profesional visualizar el cuello del tero.  [employer]iant Energy formas en las que el cuello del tero puede ser ablandado y dilatado. Pueden ser:  Tomar medicamentos.  Insercin de unas varillas delgadas (laminarias) en el cuello del tero.  Se utilizar un instrumento curvo (cureta)para raspar las clulas de la membrana que cubre el interior del tero. En algunos casos, se aplica una succin suave con la cureta. Luego la cureta se Charity fundraiser. DESPUS DEL PROCEDIMIENTO   Descansar en una sala de recuperacin hasta que se sienta estable y lista para volver a su casa.  Podr tener nuseas o vmitos si le han administrado anestesia general.  Education officer, environmental de garganta si le han colocado un tubo durante la anestesia general.  Podr sentir algunos clicos y Best boy una pequea hemorragia. Estas molestias pueden durar entre 2 das y 2 semanas despus del procedimiento.  Luego del procedimiento, el tero formar [place] Esto puede hacer que el prximo perodo se retrase.   Esta informacin no tiene Marine scientist el consejo del mdico. Asegrese de hacerle al mdico cualquier pregunta que tenga.   Document Released: 12/28/2007 Document Revised: 03/13/2013 Elsevier Interactive Patient Education 2016 [place]  (Miscarriage) El aborto espontneo es la prdida de un beb que no ha nacido (feto) antes de la semana 20 del [place]. La mayor parte de estos abortos ocurre en los primeros 3 meses. En algunos casos ocurre antes de que la mujer sepa que est [place]. Tambin se denomina "aborto espontneo" o "prdida prematura del embarazo". El aborto espontneo puede  ser Ardelia Mems experiencia que afecte emocionalmente a Geologist, engineering. Converse con su mdico si tiene dudas, cmo es el proceso de [place], y sobre planes futuros de Media planner.  CAUSAS   Algunos problemas cromosmicos pueden hacer imposible que el beb se desarrolle normalmente. Los problemas con los genes o cromosomas del beb son generalmente el resultado de errores que se producen, por casualidad, cuando el embrin se divide y crece. Estos problemas no se heredan de los [place].  Infeccin en el cuello del tero.   Problemas hormonales.   Problemas en el cuello del tero, como tener un tero incompetente. Esto ocurre cuando los tejidos no son lo suficientemente fuertes como para Risk manager.   Problemas del tero, como un tero con forma anormal, los fibromas o anormalidades congnitas.   Ciertas enfermedades crnicas.   No fume, no beba alcohol, ni consuma drogas.   Traumatismos  A veces, la causa es desconocida.  SNTOMAS   Sangrado o manchado vaginal, con o sin clicos o dolor.  Dolor o clicos en el abdomen o en la cintura.  Eliminacin de lquido, tejidos o cogulos grandes por la vagina. DIAGNSTICO  El [employer] har un examen fsico. Tambin le indicar una ecografa para confirmar el aborto. Es posible que se realicen anlisis de [place].  TRATAMIENTO   En algunos casos el tratamiento no es necesario, si se eliminan naturalmente todos los tejidos embrionarios que se encontraban en el tero. Si el feto o la placenta quedan dentro del tero (aborto incompleto), pueden infectarse, los tejidos que quedan pueden infectarse  y deben retirarse. Generalmente se realiza un procedimiento de dilatacin y curetaje (D y C). Durante el procedimiento de dilatacin y curetaje, el cuello del tero se abre (dilata) y se retira cualquier resto de tejido fetal o placentario del tero.  Si hay una infeccin, le recetarn antibiticos. Podrn recetarle otros medicamentos para reducir el tamao  del tero (contraerlo) si hay una mucho sangrado.  Si su sangre es Rh negativa y su beb es Rh positivo, usted necesitar la inyeccin de inmunoglobulina Rh. Esta inyeccin proteger a los futuros bebs de tener problemas de compatibilidad Rh en futuros embarazos. INSTRUCCIONES PARA EL CUIDADO EN EL HOGAR   El mdico le indicar reposo en cama o le permitir Automotive engineer. Vuelva a la actividad lentamente o segn las indicaciones de su mdico.  Pdale a alguien que la ayude con las responsabilidades familiares y del hogar durante este tiempo.   Lleve un registro de la cantidad y la saturacin de las toallas higinicas que Medical laboratory scientific officer. Anote esta informacin   No use tampones. No No se haga duchas vaginales ni tenga relaciones sexuales hasta que el mdico la autorice.   Slo tome medicamentos de venta libre o recetados para Glass blower/designer o Health and safety inspector, segn las indicaciones de su mdico.   No tome aspirina. La aspirina puede ocasionar hemorragias.   Concurra puntualmente a las citas de control con el mdico.   Si usted o su pareja tienen dificultades con el duelo, hable con su mdico para buscar la [employer]in ayude a Academic librarian la prdida del Media planner. Permtase el tiempo suficiente de duelo antes de quedar embarazada nuevamente.  SOLICITE ATENCIN MDICA DE INMEDIATO SI:   Siente calambres intensos o dolor en la espalda o en el abdomen.  Tiene fiebre.  Elimina grandes cogulos de [place] (del tamao de una nuez o ms) o tejidos por la vagina. Guarde lo que ha eliminado para que su mdico lo examine.   La hemorragia aumenta.   Margette Fast secrecin vaginal espesa y con mal olor.  Se siente mareada, dbil, o se desmaya.   Siente escalofros.  ASEGRESE DE QUE:   Comprende estas instrucciones.  Controlar su enfermedad.  Solicitar ayuda de inmediato si no mejora o si empeora.   Esta informacin no tiene Marine scientist el  consejo del mdico. Asegrese de hacerle al mdico cualquier pregunta que tenga.   Document Released: 04/20/2005 Document Revised: 11/05/2012 Elsevier Interactive Patient Education [employer].

## 2015-06-17 NOTE — MAU Provider Note (Signed)
History     CSN: [id number]  Arrival date and time: 06/17/15 4098   First Provider Initiated Contact with Patient 06/17/15 [phone]      Chief Complaint  Patient presents with  . Abdominal Pain  . Vaginal Bleeding   HPI Rhonda Waller 39 y.o. [id number]  presents to MAU with bleeding and abdominal pain.  She has had bleeding for 3 days.  It is sometimes heavier like a period with quarter size clots.  Other times it is only spotting.  It is dark red blood.  The abdominal pain is mild cramping located mid lower abdomen.  She denies fever, weakness, nausea, vomiting, dysuria.   She has a h/o diabetes and hypertension, both of which required medication management.  However she has not been taking these meds in some time and does not have a medical provider in town.  She reports recently moving here. OB History    Gravida Para Term Preterm AB TAB SAB Ectopic Multiple Living   0 1 0 1 0 0 5      Past Medical History  Diagnosis Date  . Pregnancy induced hypertension   . Diabetes mellitus without complication (HCC) 2012    Past Surgical History  Procedure Laterality Date  . Cholecystectomy      Family History  Problem Relation Age of Onset  . Diabetes Mother   . Diabetes Maternal Uncle     Social History  Substance Use Topics  . Smoking status: Never Smoker   . Smokeless tobacco: Never Used  . Alcohol Use: No    Allergies: No Known Allergies  Prescriptions prior to admission  Medication Sig Dispense Refill Last Dose  . benazepril (LOTENSIN) 10 MG tablet Take 1 tablet (10 mg total) by mouth daily. 60 tablet 6   . ibuprofen (ADVIL,MOTRIN) 600 MG tablet Take 1 tablet (600 mg total) by mouth every 6 (six) hours. (Patient not taking: Reported on 06/17/2015) 30 tablet 0 Not Taking at Unknown time  . norethindrone (ORTHO MICRONOR) 0.35 MG tablet Take 1 tablet (0.35 mg total) by mouth daily. 3 Package 3     ROS Pertinent ROS in HPI.  All other systems are  negative.   Physical Exam   Blood pressure 155/91, pulse 77, temperature 98.4 F (36.9 C), resp. rate 16, last menstrual period 03/16/2015, currently breastfeeding.  Physical Exam  Constitutional: She is oriented to person, place, and time. She appears well-developed and well-nourished. No distress.  HENT:  Head: Normocephalic and atraumatic.  Eyes: Conjunctivae and EOM are normal.  Cardiovascular: Normal rate and normal heart sounds.   Respiratory: Effort normal. No respiratory distress.  GI: Soft. Bowel sounds are normal. She exhibits no distension. There is no tenderness. There is no rebound and no guarding.  Genitourinary:  External genitalia is dry.  Mod amt of dark red blood present in vault.  Scant active bleeding.  Cervix is open fingertip.  NO CMT, adnexal tenderness.    Musculoskeletal: Normal range of motion. She exhibits no edema.  Neurological: She is alert and oriented to person, place, and time.  Skin: Skin is warm and dry.  Psychiatric: She has a normal mood and affect. Her behavior is normal.   Results for orders placed or performed during the hospital encounter of 06/17/15 (from the past 24 hour(s))  Urinalysis, Routine w reflex microscopic (not at [facility])     Status: Abnormal   Collection Time: 06/17/15  7:50 AM  Result Value Ref Range   Color,  Urine YELLOW YELLOW   APPearance HAZY (A) CLEAR   Specific Gravity, Urine >1.030 (H) 1.005 - 1.030   pH 6.0 5.0 - 8.0   Glucose, UA >1000 (A) NEGATIVE mg/dL   Hgb urine dipstick LARGE (A) NEGATIVE   Bilirubin Urine NEGATIVE NEGATIVE   Ketones, ur NEGATIVE NEGATIVE mg/dL   Protein, ur NEGATIVE NEGATIVE mg/dL   Nitrite NEGATIVE NEGATIVE   Leukocytes, UA NEGATIVE NEGATIVE  Urine microscopic-add on     Status: Abnormal   Collection Time: 06/17/15  7:50 AM  Result Value Ref Range   Squamous Epithelial / LPF 6-30 (A) NONE SEEN   WBC, UA 0-5 0 - 5 WBC/hpf   RBC / HPF 6-30 0 - 5 RBC/hpf   Bacteria, UA MANY (A) NONE SEEN    Urine-Other MUCOUS PRESENT   Pregnancy, urine POC     Status: Abnormal   Collection Time: 06/17/15  7:54 AM  Result Value Ref Range   Preg Test, Ur POSITIVE (A) NEGATIVE  Wet prep, genital     Status: Abnormal   Collection Time: 06/17/15  8:30 AM  Result Value Ref Range   Yeast Wet Prep HPF POC NONE SEEN NONE SEEN   Trich, Wet Prep NONE SEEN NONE SEEN   Clue Cells Wet Prep HPF POC PRESENT (A) NONE SEEN   WBC, Wet Prep HPF POC MANY (A) NONE SEEN   Sperm NONE SEEN   CBC with Differential/Platelet     Status: None   Collection Time: 06/17/15  8:45 AM  Result Value Ref Range   WBC 5.3 4.0 - 10.5 K/uL   RBC 4.34 3.87 - 5.11 MIL/uL   Hemoglobin 12.9 12.0 - 15.0 g/dL   HCT 09.838.3 11.936.0 - 14.746.0 %   MCV 88.2 78.0 - 100.0 fL   MCH 29.7 26.0 - 34.0 pg   MCHC 33.7 30.0 - 36.0 g/dL   RDW 82.913.2 56.211.5 - 13.015.5 %   Platelets 214 150 - 400 K/uL   Neutrophils Relative % 68 %   Neutro Abs 3.6 1.7 - 7.7 K/uL   Lymphocytes Relative 26 %   Lymphs Abs 1.4 0.7 - 4.0 K/uL   Monocytes Relative 4 %   Monocytes Absolute 0.2 0.1 - 1.0 K/uL   Eosinophils Relative 2 %   Eosinophils Absolute 0.1 0.0 - 0.7 K/uL   Basophils Relative 0 %   Basophils Absolute 0.0 0.0 - 0.1 K/uL  hCG, quantitative, pregnancy     Status: Abnormal   Collection Time: 06/17/15  8:45 AM  Result Value Ref Range   hCG, Beta Chain, Quant, S 669 (H) <5 mIU/mL  Glucose, capillary     Status: Abnormal   Collection Time: 06/17/15 10:24 AM  Result Value Ref Range   Glucose-Capillary 250 (H) 65 - 99 mg/dL   [place] Ob Comp Less 14 Wks  06/17/2015  CLINICAL DATA:  Vaginal bleeding in pregnancy. EXAM: OBSTETRIC <14 WK ULTRASOUND TECHNIQUE: Transabdominal ultrasound was performed for evaluation of the gestation as well as the maternal uterus and adnexal regions. COMPARISON:  None. FINDINGS: Intrauterine gestational sac: Single Yolk sac:  No Embryo:  Yes Cardiac Activity: No Heart Rate: Not applicable bpm CRL:   55.7  mm   12 w 1 d                  US  EDC: 12/29/2015 Maternal uterus/adnexae: Subchorionic hemorrhage: None Right ovary: Normal Left ovary: Normal Other :None Free fluid:  None IMPRESSION: 1. Findings meet definitive criteria for  failed pregnancy. This follows SRU consensus guidelines: Diagnostic Criteria for Nonviable Pregnancy Early in the First Trimester. [facility] [phone]. Electronically Signed   By: Signa Kell M.D.   On: 06/17/2015 10:35    MAU Course  Procedures  MDM Ectopic workup ordered to eval vaginal bleeding in previously unconfirmed pregnancy.  Pt has had no prenatal care and triage nurse was unable to detect fetal heart tones.   Pt hemodynamically stable.  Blood sugar of 250 RH positive already on chart U/S confirms failed pregnancy Consulted with Dr. Shawnie Pons whom advises for [facility].  She is down to MAU to discuss with patient and go over procedure/answer questions.     Assessment and Plan  A:  1. Missed abortion   2. Vaginal bleeding in pregnancy, first trimester   3.      Diabetes 4.      Hypertension P: Discharge to home Bleeding/pain precautions discussed Pt to return at 10am on Friday, 11/25 for D&C. NPO after midnight Thursday night.  Pt counseled extensively on importance of blood sugar and blood pressure control.  She is advised to begin care with PCP and is given info on possible options.   Patient may return to MAU as needed or if her condition were to change or worsen   Bertram Denver 06/17/2015, 11:13 AM

## 2015-06-18 LAB — CULTURE, OB URINE: Culture: >=100000

## 2015-06-18 LAB — GC/CHLAMYDIA PROBE AMP (~~LOC~~) NOT AT ARMC
Chlamydia: NEGATIVE
NEISSERIA GONORRHEA: NEGATIVE

## 2015-06-18 MED ORDER — DOXYCYCLINE HYCLATE 100 MG IV SOLR
100.0000 mg | INTRAVENOUS | Status: DC
  Filled 2015-06-18: qty 100

## 2015-06-19 ENCOUNTER — Encounter (HOSPITAL_COMMUNITY): Payer: Self-pay | Admitting: Anesthesiology

## 2015-06-19 ENCOUNTER — Encounter (HOSPITAL_COMMUNITY)
Admission: RE | Disposition: A | Discharge status: Home / Self Care | Payer: Self-pay | Source: Ambulatory Visit | Attending: Family Medicine

## 2015-06-19 ENCOUNTER — Encounter: Payer: Self-pay | Admitting: Advanced Practice Midwife

## 2015-06-19 ENCOUNTER — Ambulatory Visit (HOSPITAL_COMMUNITY)
Admission: RE | Admit: 2015-06-19 | Discharge: 2015-06-19 | Disposition: A | Discharge status: Home / Self Care | Payer: Self-pay | Source: Ambulatory Visit | Attending: Family Medicine | Admitting: Family Medicine

## 2015-06-19 DIAGNOSIS — O2340 Unspecified infection of urinary tract in pregnancy, unspecified trimester: Secondary | ICD-10-CM

## 2015-06-19 DIAGNOSIS — O021 Missed abortion: Secondary | ICD-10-CM | POA: Diagnosis present

## 2015-06-19 DIAGNOSIS — B951 Streptococcus, group B, as the cause of diseases classified elsewhere: Secondary | ICD-10-CM | POA: Insufficient documentation

## 2015-06-19 SURGERY — DILATION AND EVACUATION, UTERUS
Anesthesia: Choice

## 2015-06-19 NOTE — Progress Notes (Signed)
Patient received into Short Stay Unit, Interpretor and Dr. Shawnie PonsPratt at bedside. Patient informs interpretor, that yesterday , she had pain and passed the fetus. Bedside ultrasound performed by Dr. Shawnie PonsPratt. Dr Shawnie PonsPratt informed patient , surgery not indicated. Patient given instructions by Dr. Shawnie PonsPratt with the Assistance of the interpretor. Patient discharged home.

## 2015-06-19 NOTE — Interval H&P Note (Signed)
History and Physical Interval Note:  06/19/2015 10:25 AM  Rhonda Waller  has presented today for surgery, with the diagnosis of Missed AB 12 weeks  The various methods of treatment have been discussed with the patient and family. After consideration of risks, benefits and other options for treatment, the patient has consented to  Procedure(s): DILATATION AND EVACUATION (N/A) as a surgical intervention .  The patient's history has been reviewed, patient examined, she reports pain followed by passage of fetus yesterday, then pain subsided with bleeding similar to menses now.  Bedside u/s shows no fetus left in the uterus.  Surgery canceled and patient will be brought back to the office in 2 wks. Bleeding precautions given.  Questions were answered to the patient's satisfaction.     Judyth Demarais S

## 2015-07-08 ENCOUNTER — Encounter: Payer: Self-pay | Admitting: Family

## 2015-07-08 ENCOUNTER — Ambulatory Visit (INDEPENDENT_AMBULATORY_CARE_PROVIDER_SITE_OTHER): Payer: Self-pay | Admitting: Family

## 2015-07-08 VITALS — BP 140/83 | HR 91 | Temp 99.0°F | Ht 60.0 in | Wt 194.6 lb

## 2015-07-08 DIAGNOSIS — O039 Complete or unspecified spontaneous abortion without complication: Secondary | ICD-10-CM

## 2015-07-08 DIAGNOSIS — I10 Essential (primary) hypertension: Secondary | ICD-10-CM

## 2015-07-08 NOTE — Progress Notes (Signed)
   Subjective:    Patient ID: Rhonda Waller, female    DOB: 1976/02/12, 39 y.o.   MRN: [id number]  HPI Pt is a [id number] here for follow-up after presenting for D&C (06/19/15) and pt described heavy bleeding and passage of clot the day prior to scheduled D&C.  Pt was scheduled to be seen in clinic for follow-up.  Here today with no report of vaginal bleeding.  +pain in lower pelvis described as sharp.  Pain started this morning and is constant in nature.  Reports taking ibuprofen with relief of pain.  Pt has an appt with [facility] for paraguard placement on 07/23/15   Review of Systems  Genitourinary: Positive for pelvic pain (cramping). Negative for vaginal bleeding.  All other systems reviewed and are negative.      Objective:   Physical Exam  Constitutional: She is oriented to person, place, and time. She appears well-developed and well-nourished. No distress.  HENT:  Head: Normocephalic and atraumatic.  Neck: Normal range of motion. Neck supple. No thyromegaly present.  Neurological: She is alert and oriented to person, place, and time.  Skin: Skin is warm and dry.   Filed Vitals:   07/08/15 1418 07/08/15 1424  BP: 160/93 140/83  Pulse: 73 91  Temp: 99 F (37.2 C)       Assessment & Plan:   Post SAB Follow-up Diabetes Chronic Hypertension  Plan: Keep appt with [facility] for IUD; abstain until visit Completed application for mammogram scholarship Given [facility] information for [facility] [place] Kennith GainN [place], [place]

## 2015-07-08 NOTE — Progress Notes (Signed)
Spanish interpreter [name] Pt reports history of diabetes and hypertension; does not have a PCP nor has she been taking medications to manage disease  Pt given info for Free Pap Screening and Mammogram Scholarship faxed

## 2015-07-09 ENCOUNTER — Other Ambulatory Visit: Payer: Self-pay | Admitting: Family

## 2015-07-09 DIAGNOSIS — Z1231 Encounter for screening mammogram for malignant neoplasm of breast: Secondary | ICD-10-CM

## 2015-07-10 ENCOUNTER — Encounter (HOSPITAL_COMMUNITY): Payer: Self-pay

## 2015-07-16 ENCOUNTER — Encounter (HOSPITAL_COMMUNITY): Payer: Self-pay | Admitting: *Deleted

## 2016-04-21 ENCOUNTER — Encounter (HOSPITAL_COMMUNITY): Payer: Self-pay

## 2018-05-03 ENCOUNTER — Other Ambulatory Visit: Payer: Self-pay

## 2018-05-03 ENCOUNTER — Encounter (HOSPITAL_COMMUNITY): Payer: Self-pay

## 2018-05-03 ENCOUNTER — Emergency Department (HOSPITAL_COMMUNITY)
Admission: EM | Admit: 2018-05-03 | Discharge: 2018-05-03 | Disposition: A | Discharge status: Home / Self Care | Payer: Self-pay | Attending: Emergency Medicine | Admitting: Emergency Medicine

## 2018-05-03 DIAGNOSIS — E1165 Type 2 diabetes mellitus with hyperglycemia: Secondary | ICD-10-CM | POA: Insufficient documentation

## 2018-05-03 DIAGNOSIS — I10 Essential (primary) hypertension: Secondary | ICD-10-CM | POA: Insufficient documentation

## 2018-05-03 DIAGNOSIS — R739 Hyperglycemia, unspecified: Secondary | ICD-10-CM

## 2018-05-03 DIAGNOSIS — N3090 Cystitis, unspecified without hematuria: Secondary | ICD-10-CM | POA: Insufficient documentation

## 2018-05-03 DIAGNOSIS — N3000 Acute cystitis without hematuria: Secondary | ICD-10-CM

## 2018-05-03 LAB — COMPREHENSIVE METABOLIC PANEL
ALT: 20 U/L (ref 0–44)
AST: 18 U/L (ref 15–41)
Albumin: 4 g/dL (ref 3.5–5.0)
Alkaline Phosphatase: 136 U/L — ABNORMAL HIGH (ref 38–126)
Anion gap: 10 (ref 5–15)
BUN: 11 mg/dL (ref 6–20)
CO2: 23 mmol/L (ref 22–32)
Calcium: 9 mg/dL (ref 8.9–10.3)
Chloride: 101 mmol/L (ref 98–111)
Creatinine, Ser: 0.61 mg/dL (ref 0.44–1.00)
GFR calc Af Amer: >60 mL/min (ref >60)
GFR calc non Af Amer: >60 mL/min (ref >60)
Glucose, Bld: 365 mg/dL — ABNORMAL HIGH (ref 70–99)
Potassium: 3.9 mmol/L (ref 3.5–5.1)
Sodium: 134 mmol/L — ABNORMAL LOW (ref 135–145)
Total Bilirubin: 1.2 mg/dL (ref 0.3–1.2)
Total Protein: 7.5 g/dL (ref 6.5–8.1)

## 2018-05-03 LAB — CBC
HCT: 43.1 % (ref 36.0–46.0)
Hemoglobin: 14.1 g/dL (ref 12.0–15.0)
MCH: 28.5 pg (ref 26.0–34.0)
MCHC: 32.7 g/dL (ref 30.0–36.0)
MCV: 87.1 fL (ref 80.0–100.0)
Platelets: 239 10*3/uL (ref 150–400)
RBC: 4.95 MIL/uL (ref 3.87–5.11)
RDW: 12.3 % (ref 11.5–15.5)
WBC: 10.6 10*3/uL — ABNORMAL HIGH (ref 4.0–10.5)
nRBC: 0 % (ref 0.0–0.2)

## 2018-05-03 LAB — URINALYSIS, ROUTINE W REFLEX MICROSCOPIC
Bilirubin Urine: NEGATIVE
Glucose, UA: >=500 mg/dL — AB
Ketones, ur: 80 mg/dL — AB
Nitrite: POSITIVE — AB
Protein, ur: 100 mg/dL — AB
RBC / HPF: >50 RBC/hpf — ABNORMAL HIGH (ref 0–5)
Specific Gravity, Urine: 1.041 — ABNORMAL HIGH (ref 1.005–1.030)
WBC, UA: >50 WBC/hpf — ABNORMAL HIGH (ref 0–5)
pH: 6 (ref 5.0–8.0)

## 2018-05-03 LAB — I-STAT BETA HCG BLOOD, ED (MC, WL, AP ONLY): I-stat hCG, quantitative: <5 m[IU]/mL (ref <5)

## 2018-05-03 LAB — LIPASE, BLOOD: Lipase: 29 U/L (ref 11–51)

## 2018-05-03 MED ORDER — OXYCODONE-ACETAMINOPHEN 5-325 MG PO TABS
1.0000 | ORAL_TABLET | Freq: Once | ORAL | Status: AC
  Administered 2018-05-03: 1 via ORAL
  Filled 2018-05-03: qty 1

## 2018-05-03 MED ORDER — CEPHALEXIN 250 MG PO CAPS
1000.0000 mg | ORAL_CAPSULE | Freq: Once | ORAL | Status: AC
  Administered 2018-05-03: 1000 mg via ORAL
  Filled 2018-05-03: qty 4

## 2018-05-03 MED ORDER — ONDANSETRON 4 MG PO TBDP
4.0000 mg | ORAL_TABLET | Freq: Once | ORAL | Status: AC
  Administered 2018-05-03: 4 mg via ORAL
  Filled 2018-05-03: qty 1

## 2018-05-03 MED ORDER — METFORMIN HCL ER 750 MG PO TB24: 750.0000 mg | ORAL_TABLET | Freq: Every day | ORAL | 0 refills | Status: DC

## 2018-05-03 MED ORDER — LISINOPRIL 10 MG PO TABS: 10.0000 mg | ORAL_TABLET | Freq: Every day | ORAL | 0 refills | Status: DC

## 2018-05-03 MED ORDER — IBUPROFEN 400 MG PO TABS
600.0000 mg | ORAL_TABLET | Freq: Once | ORAL | Status: AC
  Administered 2018-05-03: 600 mg via ORAL
  Filled 2018-05-03: qty 1

## 2018-05-03 MED ORDER — CEPHALEXIN 500 MG PO CAPS: 500.0000 mg | ORAL_CAPSULE | Freq: Three times a day (TID) | ORAL | 0 refills | Status: DC

## 2018-05-03 NOTE — ED Provider Notes (Signed)
[facility] EMERGENCY DEPARTMENT Provider Note   CSN: [id number] Arrival date & time: 05/03/18  1478     History   Chief Complaint Chief Complaint  Patient presents with  . Abdominal Pain  . Back Pain    HPI Meygan Kyser is a 42 y.o. female.  HPI   42 year old female with lower abdominal lower back pain.  Symptom onset a few days to about a week ago.  Pain has been progressive and is now constant.  Pain is diffuse across lower abdomen/pelvis.  Also a pressure-like sensation and urinary urgency.  Dysuria.  No hematuria.  Nausea.  No vomiting.  No fevers or chills.  No vomiting or diarrhea. Interpretor service used.   Past Medical History:  Diagnosis Date  . Diabetes mellitus without complication (HCC) 2012  . Pregnancy induced hypertension     Patient Active Problem List   Diagnosis Date Noted  . GBS (group B streptococcus) UTI complicating pregnancy 06/19/2015  . Abortion, missed 06/17/2015  . Diabetes mellitus during pregnancy, antepartum 06/17/2015  . Essential hypertension 06/17/2015  . Missed abortion 06/17/2015  . Abnormal liver function tests 05/07/2013  . Obesity 05/06/2013  . KNEE PAIN, BILATERAL 10/05/2010  . OBESITY 04/09/2010  . HYPERTENSION, BENIGN ESSENTIAL 04/09/2010  . DIABETES MELLITUS, TYPE II 07/25/2009    Past Surgical History:  Procedure Laterality Date  . CHOLECYSTECTOMY       OB History    Gravida  7   Para  5   Term  5   Preterm  0   AB  1   Living  5     SAB  1   TAB  0   Ectopic  0   Multiple  0   Live Births  5            Home Medications    Prior to Admission medications   Medication Sig Start Date End Date Taking? Authorizing Provider  ibuprofen (ADVIL,MOTRIN) 600 MG tablet Take 1 tablet (600 mg total) by mouth every 6 (six) hours as needed for moderate pain. 06/17/15   Bertram Denver, PA-C    Family History Family History  Problem Relation Age of Onset  . Diabetes  Mother   . Diabetes Maternal Uncle     Social History Social History   Tobacco Use  . Smoking status: Never Smoker  . Smokeless tobacco: Never Used  Substance Use Topics  . Alcohol use: No  . Drug use: No     Allergies   Patient has no known allergies.   Review of Systems Review of Systems  All systems reviewed and negative, other than as noted in HPI.  Physical Exam Updated Vital Signs BP (!) 187/95   Pulse 79   Temp 99 F (37.2 C) (Oral)   Resp 20   Ht 4\' 11"  (1.499 m)   Wt 85.7 kg   LMP 04/19/2018 (Approximate)   SpO2 98%   Breastfeeding? No   BMI 38.17 kg/m   Physical Exam  Constitutional: She appears well-developed and well-nourished. No distress.  HENT:  Head: Normocephalic and atraumatic.  Eyes: Conjunctivae are normal. Right eye exhibits no discharge. Left eye exhibits no discharge.  Neck: Neck supple.  Cardiovascular: Normal rate, regular rhythm and normal heart sounds. Exam reveals no gallop and no friction rub.  No murmur heard. Pulmonary/Chest: Effort normal and breath sounds normal. No respiratory distress.  Abdominal: Soft. She exhibits no distension.  Mild tenderness across lower abdomen without  rebound or guarding.  No distention.  No CVA tenderness.  Musculoskeletal: She exhibits no edema or tenderness.  Neurological: She is alert.  Skin: Skin is warm and dry.  Psychiatric: She has a normal mood and affect. Her behavior is normal. Thought content normal.  Nursing note and vitals reviewed.    ED Treatments / Results  Labs (all labs ordered are listed, but only abnormal results are displayed) Labs Reviewed  COMPREHENSIVE METABOLIC PANEL - Abnormal; Notable for the following components:      Result Value   Sodium 134 (*)    Glucose, Bld 365 (*)    Alkaline Phosphatase 136 (*)    All other components within normal limits  CBC - Abnormal; Notable for the following components:   WBC 10.6 (*)    All other components within normal limits   URINALYSIS, ROUTINE W REFLEX MICROSCOPIC - Abnormal; Notable for the following components:   APPearance CLOUDY (*)    Specific Gravity, Urine 1.041 (*)    Glucose, UA >=500 (*)    Hgb urine dipstick LARGE (*)    Ketones, ur 80 (*)    Protein, ur 100 (*)    Nitrite POSITIVE (*)    Leukocytes, UA LARGE (*)    RBC / HPF >50 (*)    WBC, UA >50 (*)    Bacteria, UA RARE (*)    All other components within normal limits  URINE CULTURE  LIPASE, BLOOD  I-STAT BETA HCG BLOOD, ED ([facility], [facility], [facility] ONLY)    EKG None  Radiology No results found.  Procedures Procedures (including critical care time)  Medications Ordered in ED Medications  cephALEXin (KEFLEX) capsule 1,000 mg (has no administration in time range)  ibuprofen (ADVIL,MOTRIN) tablet 600 mg (has no administration in time range)  ondansetron (ZOFRAN-ODT) disintegrating tablet 4 mg (has no administration in time range)  oxyCODONE-acetaminophen (PERCOCET/ROXICET) 5-325 MG per tablet 1 tablet (has no administration in time range)     Initial Impression / Assessment and Plan / ED Course  I have reviewed the triage vital signs and the nursing notes.  Pertinent labs & imaging results that were available during my care of the patient were reviewed by me and considered in my medical decision making (see chart for details).     42 year old female with symptoms and work-up consistent with UTI.  She is afebrile.  She has no CVA tenderness.  I feel she is appropriate for outpatient treatment.  We will start her on Keflex.  She may continue take ibuprofen as needed for pain.  She additionally had some questions about her hypertension and requested medications.  She reports that she had previously been prescribed something but did not take it because she cannot afford the prescription.  She does not have routine medical care.  We will start her on a low-dose lisinopril.   Also with a non-fasting glucose of 365 and glucosuria. Listed hx of DM2  but not on meds. Will start on lower dose of metformin for now. Discussed the need to establish with a PCP for ongoing monitoring and routine care.  She will be provided with a list outpatient resources.  Return precautions were discussed.  Final Clinical Impressions(s) / ED Diagnoses   Final diagnoses:  Hypertension, unspecified type  Acute cystitis without hematuria  Hyperglycemia    ED Discharge Orders    None       Raeford Razor, MD 05/03/18 1308

## 2018-05-03 NOTE — ED Notes (Signed)
Patient able to ambulate independently  

## 2018-05-03 NOTE — ED Triage Notes (Signed)
Per the spanish interpreter, pt endorses lower back pain, pelvic pain, burning with urination x 1 weeks. Denies fever or chills. Hypertensive and has hx of same, does not take medication for it.

## 2018-05-05 LAB — URINE CULTURE: Culture: >=100000 — AB

## 2018-05-06 ENCOUNTER — Telehealth: Payer: Self-pay

## 2018-05-06 NOTE — Telephone Encounter (Signed)
Post ED Visit - Positive Culture Follow-up  Culture report reviewed by antimicrobial stewardship pharmacist:  []  Enzo Bi, Pharm.D. []  Celedonio Miyamoto, Pharm.D., BCPS AQ-ID []  Garvin Fila, Pharm.D., BCPS []  Georgina Pillion, Pharm.D., BCPS []  [place], [address].D., BCPS, AAHIVP []  Estella Husk, Pharm.D., BCPS, AAHIVP []  Lysle Pearl, PharmD, BCPS []  Phillips Climes, PharmD, BCPS []  Agapito Games, PharmD, BCPS [x]  Verlan Friends, PharmD  Positive urine culture Treated with Cephalexin, organism sensitive to the same and no further patient follow-up is required at this time.  Jerry Caras 05/06/2018, 10:17 AM

## 2019-06-01 ENCOUNTER — Emergency Department (HOSPITAL_COMMUNITY): Payer: Self-pay

## 2019-06-01 ENCOUNTER — Other Ambulatory Visit: Payer: Self-pay

## 2019-06-01 ENCOUNTER — Emergency Department (HOSPITAL_COMMUNITY)
Admission: EM | Admit: 2019-06-01 | Discharge: 2019-06-02 | Disposition: A | Discharge status: Home / Self Care | Payer: Self-pay | Attending: Emergency Medicine | Admitting: Emergency Medicine

## 2019-06-01 ENCOUNTER — Encounter (HOSPITAL_COMMUNITY): Payer: Self-pay | Admitting: Emergency Medicine

## 2019-06-01 DIAGNOSIS — I10 Essential (primary) hypertension: Secondary | ICD-10-CM | POA: Insufficient documentation

## 2019-06-01 DIAGNOSIS — E1165 Type 2 diabetes mellitus with hyperglycemia: Secondary | ICD-10-CM | POA: Insufficient documentation

## 2019-06-01 DIAGNOSIS — N12 Tubulo-interstitial nephritis, not specified as acute or chronic: Secondary | ICD-10-CM | POA: Insufficient documentation

## 2019-06-01 DIAGNOSIS — Z7984 Long term (current) use of oral hypoglycemic drugs: Secondary | ICD-10-CM | POA: Insufficient documentation

## 2019-06-01 DIAGNOSIS — Z79899 Other long term (current) drug therapy: Secondary | ICD-10-CM | POA: Insufficient documentation

## 2019-06-01 DIAGNOSIS — R739 Hyperglycemia, unspecified: Secondary | ICD-10-CM

## 2019-06-01 LAB — COMPREHENSIVE METABOLIC PANEL
ALT: 23 U/L (ref 0–44)
AST: 19 U/L (ref 15–41)
Albumin: 3.6 g/dL (ref 3.5–5.0)
Alkaline Phosphatase: 128 U/L — ABNORMAL HIGH (ref 38–126)
Anion gap: 10 (ref 5–15)
BUN: 10 mg/dL (ref 6–20)
CO2: 23 mmol/L (ref 22–32)
Calcium: 9.4 mg/dL (ref 8.9–10.3)
Chloride: 96 mmol/L — ABNORMAL LOW (ref 98–111)
Creatinine, Ser: 0.77 mg/dL (ref 0.44–1.00)
GFR calc Af Amer: >60 mL/min (ref >60)
GFR calc non Af Amer: >60 mL/min (ref >60)
Glucose, Bld: 383 mg/dL — ABNORMAL HIGH (ref 70–99)
Potassium: 3.9 mmol/L (ref 3.5–5.1)
Sodium: 129 mmol/L — ABNORMAL LOW (ref 135–145)
Total Bilirubin: 0.9 mg/dL (ref 0.3–1.2)
Total Protein: 6.9 g/dL (ref 6.5–8.1)

## 2019-06-01 LAB — URINALYSIS, ROUTINE W REFLEX MICROSCOPIC
Bilirubin Urine: NEGATIVE
Glucose, UA: >=500 mg/dL — AB
Hgb urine dipstick: NEGATIVE
Ketones, ur: NEGATIVE mg/dL
Nitrite: NEGATIVE
Protein, ur: NEGATIVE mg/dL
Specific Gravity, Urine: 1.01 (ref 1.005–1.030)
pH: 7 (ref 5.0–8.0)

## 2019-06-01 LAB — CBC
HCT: 38.7 % (ref 36.0–46.0)
Hemoglobin: 13.1 g/dL (ref 12.0–15.0)
MCH: 28.8 pg (ref 26.0–34.0)
MCHC: 33.9 g/dL (ref 30.0–36.0)
MCV: 85.1 fL (ref 80.0–100.0)
Platelets: 233 10*3/uL (ref 150–400)
RBC: 4.55 MIL/uL (ref 3.87–5.11)
RDW: 12.9 % (ref 11.5–15.5)
WBC: 7.5 10*3/uL (ref 4.0–10.5)
nRBC: 0 % (ref 0.0–0.2)

## 2019-06-01 LAB — LIPASE, BLOOD: Lipase: 43 U/L (ref 11–51)

## 2019-06-01 LAB — I-STAT BETA HCG BLOOD, ED (MC, WL, AP ONLY): I-stat hCG, quantitative: <5 m[IU]/mL (ref <5)

## 2019-06-01 MED ORDER — SODIUM CHLORIDE 0.9% FLUSH: 3.0000 mL | Freq: Once | INTRAVENOUS | Status: DC

## 2019-06-01 MED ORDER — MORPHINE SULFATE (PF) 4 MG/ML IV SOLN
4.0000 mg | Freq: Once | INTRAVENOUS | Status: AC
  Administered 2019-06-01: 4 mg via INTRAVENOUS
  Filled 2019-06-01: qty 1

## 2019-06-01 MED ORDER — SODIUM CHLORIDE 0.9 % IV SOLN
1.0000 g | Freq: Once | INTRAVENOUS | Status: AC
  Administered 2019-06-02: 01:00:00 1 g via INTRAVENOUS
  Filled 2019-06-01: qty 10

## 2019-06-01 MED ORDER — IBUPROFEN 800 MG PO TABS
800.0000 mg | ORAL_TABLET | Freq: Once | ORAL | Status: AC
  Administered 2019-06-01: 800 mg via ORAL
  Filled 2019-06-01: qty 1

## 2019-06-01 MED ORDER — SODIUM CHLORIDE 0.9 % IV BOLUS
1000.0000 mL | Freq: Once | INTRAVENOUS | Status: AC
  Administered 2019-06-01: 1000 mL via INTRAVENOUS

## 2019-06-01 MED ORDER — ONDANSETRON HCL 4 MG/2ML IJ SOLN
4.0000 mg | Freq: Once | INTRAMUSCULAR | Status: AC
  Administered 2019-06-01: 4 mg via INTRAVENOUS
  Filled 2019-06-01: qty 2

## 2019-06-01 NOTE — ED Triage Notes (Signed)
Pt has had pain to right flank for 5 nights. Pt saw PCP Thursday and was told she had a vaginal infection. Taking medications for the infection. Pt states she has some blood in her urine. Pt is also nauseated.

## 2019-06-01 NOTE — ED Provider Notes (Signed)
[facility] EMERGENCY DEPARTMENT Provider Note   CSN: [id number] Arrival date & time: 06/01/19  2155     History   Chief Complaint Chief Complaint  Patient presents with  . Abdominal Pain    HPI Rhonda Waller is a 43 y.o. female.     Pt presents to the ED today with right sided flank pain.  The pt said she has had pain for 5 days.  The pt denies any f/c.  She did go to an UC who told her she had a UTI and was put on Amox.  The pt said she is not getting better.  She did have some blood in her urine today.  She is on Nexplanon, but LMP was in September.  She took some tylenol for pain this afternoon.     Past Medical History:  Diagnosis Date  . Diabetes mellitus without complication (HCC) 2012  . Pregnancy induced hypertension     Patient Active Problem List   Diagnosis Date Noted  . GBS (group B streptococcus) UTI complicating pregnancy 06/19/2015  . Abortion, missed 06/17/2015  . Diabetes mellitus during pregnancy, antepartum 06/17/2015  . Essential hypertension 06/17/2015  . Missed abortion 06/17/2015  . Abnormal liver function tests 05/07/2013  . Obesity 05/06/2013  . KNEE PAIN, BILATERAL 10/05/2010  . OBESITY 04/09/2010  . HYPERTENSION, BENIGN ESSENTIAL 04/09/2010  . DIABETES MELLITUS, TYPE II 07/25/2009    Past Surgical History:  Procedure Laterality Date  . CHOLECYSTECTOMY       OB History    Gravida  7   Para  5   Term  5   Preterm  0   AB  1   Living  5     SAB  1   TAB  0   Ectopic  0   Multiple  0   Live Births  5            Home Medications    Prior to Admission medications   Medication Sig Start Date End Date Taking? Authorizing Provider  Acetaminophen (TYLENOL PO) Take 1 tablet by mouth every 6 (six) hours as needed (pain/headache).   Yes [provider]  etonogestrel (NEXPLANON) 68 MG IMPL implant 1 each by Subdermal route once.   Yes [provider]  metFORMIN (GLUCOPHAGE)  500 MG tablet Take 500 mg by mouth 2 (two) times daily. 05/30/19  Yes [provider]  nystatin cream (MYCOSTATIN) Apply 1 application topically 2 (two) times daily as needed (rash/irritation).  04/16/19  Yes [provider]  cefdinir (OMNICEF) 300 MG capsule Take 1 capsule (300 mg total) by mouth 2 (two) times daily. 06/02/19   Ward, Layla Maw, DO  ibuprofen (ADVIL) 800 MG tablet Take 1 tablet (800 mg total) by mouth every 8 (eight) hours as needed for mild pain. 06/02/19   Ward, Layla Maw, DO  lisinopril (ZESTRIL) 40 MG tablet Take 1 tablet (40 mg total) by mouth daily. 06/02/19   Ward, Layla Maw, DO  ondansetron (ZOFRAN ODT) 4 MG disintegrating tablet Take 1 tablet (4 mg total) by mouth every 6 (six) hours as needed. 06/02/19   Ward, Layla Maw, DO    Family History Family History  Problem Relation Age of Onset  . Diabetes Mother   . Diabetes Maternal Uncle     Social History Social History   Tobacco Use  . Smoking status: Never Smoker  . Smokeless tobacco: Never Used  Substance Use Topics  . Alcohol use: No  .  Drug use: No     Allergies   Patient has no known allergies.   Review of Systems Review of Systems  Gastrointestinal: Positive for abdominal pain.  All other systems reviewed and are negative.    Physical Exam Updated Vital Signs BP (!) 146/85   Pulse 71   Temp 98.1 F (36.7 C) (Oral)   Resp 17   Ht 4\' 11"  (1.499 m)   Wt 83 kg   LMP 04/01/2019   SpO2 96%   BMI 36.96 kg/m   Physical Exam Vitals signs and nursing note reviewed.  Constitutional:      Appearance: She is well-developed.  HENT:     Head: Normocephalic and atraumatic.     Mouth/Throat:     Mouth: Mucous membranes are moist.     Pharynx: Oropharynx is clear.  Eyes:     Extraocular Movements: Extraocular movements intact.     Pupils: Pupils are equal, round, and reactive to light.  Cardiovascular:     Rate and Rhythm: Normal rate and regular rhythm.  Pulmonary:      Effort: Pulmonary effort is normal.     Breath sounds: Normal breath sounds.  Abdominal:     General: Abdomen is flat and scaphoid. Bowel sounds are normal.     Palpations: Abdomen is soft.     Tenderness: There is generalized abdominal tenderness.  Skin:    General: Skin is warm.     Capillary Refill: Capillary refill takes less than 2 seconds.  Neurological:     General: No focal deficit present.     Mental Status: She is alert and oriented to person, place, and time.  Psychiatric:        Mood and Affect: Mood normal.        Behavior: Behavior normal.      ED Treatments / Results  Labs (all labs ordered are listed, but only abnormal results are displayed) Labs Reviewed  COMPREHENSIVE METABOLIC PANEL - Abnormal; Notable for the following components:      Result Value   Sodium 129 (*)    Chloride 96 (*)    Glucose, Bld 383 (*)    Alkaline Phosphatase 128 (*)    All other components within normal limits  URINALYSIS, ROUTINE W REFLEX MICROSCOPIC - Abnormal; Notable for the following components:   Color, Urine STRAW (*)    Glucose, UA >=500 (*)    Leukocytes,Ua SMALL (*)    Bacteria, UA RARE (*)    All other components within normal limits  CBG MONITORING, ED - Abnormal; Notable for the following components:   Glucose-Capillary 289 (*)    All other components within normal limits  URINE CULTURE  LIPASE, BLOOD  CBC  I-STAT BETA HCG BLOOD, ED ([facility], [facility], [facility] ONLY)    EKG None  Radiology Ct Renal Stone Study  Result Date: 06/01/2019 CLINICAL DATA:  Right flank pain, nausea and vomiting for 1 week EXAM: CT ABDOMEN AND PELVIS WITHOUT CONTRAST TECHNIQUE: Multidetector CT imaging of the abdomen and pelvis was performed following the standard protocol without IV contrast. COMPARISON:  None. FINDINGS: Lower chest: Lung bases are clear. Normal heart size. No pericardial effusion. Hepatobiliary: No focal liver abnormality is seen. Patient is post cholecystectomy. Pneumobilia seen  within the medial left lobe and minimally in the right lobe liver. No calcified intraductal gallstones. Pancreas: Unremarkable. No pancreatic ductal dilatation or surrounding inflammatory changes. Spleen: Normal in size without focal abnormality. Adrenals/Urinary Tract: Normal adrenal glands. No obstructive urolithiasis or hydronephrosis.  No visible or contour deforming renal lesions. Mild circumferential bladder wall thickening. Stomach/Bowel: Distal esophagus, stomach and duodenal sweep are unremarkable. No small bowel wall thickening or dilatation. No evidence of obstruction. A normal appendix is visualized. No colonic dilatation or wall thickening. Vascular/Lymphatic: The aorta is normal caliber. No suspicious or enlarged lymph nodes in the included lymphatic chains. Reproductive: Normal anteverted uterus. Small amount of air and debris within the vaginal vault, correlate with menses. Normal follicle in the left ovary. No concerning adnexal lesions. Other: No abdominopelvic free fluid or free gas. No bowel containing hernias. Musculoskeletal: Gluteal soft tissue calcifications likely injection granulomata. No acute osseous or soft tissue abnormality. Mild degenerative changes at L5-S1. Bone islands in the left ilium. IMPRESSION: 1. No obstructive urolithiasis or hydronephrosis. 2. Mild circumferential bladder wall thickening, which may reflect cystitis. Correlate with urinalysis. 3. Post cholecystectomy with pneumobilia, correlate with surgical notes for prior sphincterotomy or instrumentation of the biliary tree. Electronically Signed   By: Kreg ShropshirePrice  DeHay M.D.   On: 06/01/2019 23:53    Procedures Procedures (including critical care time)  Medications Ordered in ED Medications  ondansetron (ZOFRAN) injection 4 mg (4 mg Intravenous Given 06/01/19 2342)  morphine 4 MG/ML injection 4 mg (4 mg Intravenous Given 06/01/19 2342)  sodium chloride 0.9 % bolus 1,000 mL (0 mLs Intravenous Stopped 06/02/19 0046)   ibuprofen (ADVIL) tablet 800 mg (800 mg Oral Given 06/01/19 2345)  cefTRIAXone (ROCEPHIN) 1 g in sodium chloride 0.9 % 100 mL IVPB (0 g Intravenous Stopped 06/02/19 0127)  ketorolac (TORADOL) 30 MG/ML injection 30 mg (30 mg Intravenous Given 06/02/19 0055)  sodium chloride 0.9 % bolus 1,000 mL (0 mLs Intravenous Stopped 06/02/19 0124)  ondansetron (ZOFRAN) injection 4 mg (4 mg Intravenous Given 06/02/19 0047)  lisinopril (ZESTRIL) tablet 20 mg (20 mg Oral Given 06/02/19 0128)     Initial Impression / Assessment and Plan / ED Course  I have reviewed the triage vital signs and the nursing notes.  Pertinent labs & imaging results that were available during my care of the patient were reviewed by me and considered in my medical decision making (see chart for details).       labs pending at shift change.  CT pending at shift change.  Pt signed out to Dr. Elesa MassedWard.  Final Clinical Impressions(s) / ED Diagnoses   Final diagnoses:  Pyelonephritis of right kidney  Uncontrolled hypertension  Hyperglycemia    ED Discharge Orders         Ordered    lisinopril (ZESTRIL) 40 MG tablet  Daily     06/02/19 0225    cefdinir (OMNICEF) 300 MG capsule  2 times daily     06/02/19 0225    ondansetron (ZOFRAN ODT) 4 MG disintegrating tablet  Every 6 hours PRN     06/02/19 0225    ibuprofen (ADVIL) 800 MG tablet  Every 8 hours PRN     06/02/19 0235           Jacalyn LefevreHaviland, Gentry Pilson, MD 06/02/19 1619

## 2019-06-01 NOTE — ED Provider Notes (Signed)
11:05 PM  Assumed care from Dr. Particia Nearing.  Patient is a 43 year old Spanish-speaking female who presents to the emergency department with R flank pain.  Went to UC and told has UTI, is on Amox, 99.9 temp, BP elevated due to pain, getting pain medication, CT renal pending.    12:10 AM  Pt's CT scan shows no kidney stone, hydronephrosis.  She does have bladder wall thickening consistent with cystitis.  Suspect pyelonephritis given her flank pain.  Still having some pain in her back as well as dizziness and nausea.  Will give Toradol, repeat dose of Zofran and a second liter of fluids.  CT also shows that she is status post cholecystectomy with pneumobilia which is likely from instrumentation of the biliary tree however we have no previous operative note and patient is not aware of this being performed during surgery.  She states that she had her cholecystectomy in [place] 15 years ago.  She is not tender in the right upper quadrant today.  Symptoms more consistent with pyelonephritis.  Blood pressure slowly improving with pain relief.  She is not having headaches, vision changes, chest pain, numbness or weakness.  1:05 AM  Pt reports feeling better but still hypertensive.  She states that when she was at urgent care on Thursday 05/30/19 she was told her blood pressure was elevated.  When she last saw her PCP in September she was told her blood pressure was elevated.  She is on lisinopril 20 mg and took her last dose at 8 PM last night.  Will give second dose of lisinopril 20 mg as well as small dose of IV hydralazine as she has had some systolic pressures in the 210s.  I suspect that she needs a higher dose of blood pressure medications.  She is not symptomatic from this.  Her abdominal exam is still benign.  Will p.o. challenge.  Reports she was started on amoxicillin 3 times a day for 1 week on Thursday and reports symptoms have been improving on this medication.  Was told to take this medication for the next 7  days.  Blood glucose was 383 in the emergency department and is now down to 289 with IV hydration.  She is not in DKA.  She states she is on medications for diabetes as well.  Recommended close follow-up with her primary care physician for uncontrolled diabetes and hypertension.   2:20 AM  Pt's blood pressure has improved and is 160/87.  I have urged her to follow-up closely with her primary care doctor for her uncontrolled hypertension and diabetes.  She is on Metformin 500 mg twice daily and lisinopril 20 mg once daily.  We will increase her lisinopril to 40 mg a day.  Recommended diet changes.    As for her pyelonephritis, will treat with cefdinir 300 mg twice daily for the next 2 weeks.  Will discharge with ibuprofen, Zofran.  We will have her stop the amoxicillin she was prescribed.  Urine culture pending.   At this time, I do not feel there is any life-threatening condition present. I have reviewed, interpreted and discussed all results (EKG, imaging, lab, urine as appropriate) and exam findings with patient/family. I have reviewed nursing notes and appropriate previous records.  I feel the patient is safe to be discharged home without further emergent workup and can continue workup as an outpatient as needed. Discussed usual and customary return precautions. Patient/family verbalize understanding and are comfortable with this plan.  Outpatient follow-up has been provided  as needed. All questions have been answered.    Yacine Garriga, Delice Bison, DO 06/02/19 0222

## 2019-06-02 LAB — CBG MONITORING, ED: Glucose-Capillary: 289 mg/dL — ABNORMAL HIGH (ref 70–99)

## 2019-06-02 MED ORDER — ONDANSETRON 4 MG PO TBDP: 4.0000 mg | ORAL_TABLET | Freq: Four times a day (QID) | ORAL | 0 refills | Status: DC | PRN

## 2019-06-02 MED ORDER — HYDROMORPHONE HCL 1 MG/ML IJ SOLN
1.0000 mg | Freq: Once | INTRAMUSCULAR | Status: DC
  Filled 2019-06-02: qty 1

## 2019-06-02 MED ORDER — IBUPROFEN 800 MG PO TABS: 800.0000 mg | ORAL_TABLET | Freq: Once | ORAL | Status: DC

## 2019-06-02 MED ORDER — ONDANSETRON HCL 4 MG/2ML IJ SOLN
4.0000 mg | Freq: Once | INTRAMUSCULAR | Status: AC
  Administered 2019-06-02: 01:00:00 4 mg via INTRAVENOUS
  Filled 2019-06-02: qty 2

## 2019-06-02 MED ORDER — KETOROLAC TROMETHAMINE 30 MG/ML IJ SOLN
30.0000 mg | Freq: Once | INTRAMUSCULAR | Status: AC
  Administered 2019-06-02: 30 mg via INTRAVENOUS
  Filled 2019-06-02: qty 1

## 2019-06-02 MED ORDER — IBUPROFEN 800 MG PO TABS: 800.0000 mg | ORAL_TABLET | Freq: Three times a day (TID) | ORAL | 0 refills | Status: DC | PRN

## 2019-06-02 MED ORDER — SODIUM CHLORIDE 0.9 % IV BOLUS (SEPSIS)
1000.0000 mL | Freq: Once | INTRAVENOUS | Status: AC
  Administered 2019-06-02: 1000 mL via INTRAVENOUS

## 2019-06-02 MED ORDER — HYDRALAZINE HCL 20 MG/ML IJ SOLN: 5.0000 mg | Freq: Once | INTRAMUSCULAR | Status: DC

## 2019-06-02 MED ORDER — CEFDINIR 300 MG PO CAPS: 300.0000 mg | ORAL_CAPSULE | Freq: Two times a day (BID) | ORAL | 0 refills | Status: DC

## 2019-06-02 MED ORDER — LISINOPRIL 20 MG PO TABS
20.0000 mg | ORAL_TABLET | Freq: Once | ORAL | Status: AC
  Administered 2019-06-02: 01:00:00 20 mg via ORAL
  Filled 2019-06-02: qty 1

## 2019-06-02 MED ORDER — LISINOPRIL 40 MG PO TABS: 40.0000 mg | ORAL_TABLET | Freq: Every day | ORAL | 1 refills | Status: DC

## 2019-06-02 NOTE — Discharge Instructions (Addendum)
Please schedule an appointment with your primary care doctor as you will need close follow-up for your uncontrolled high blood pressure and diabetes.  We are increasing your lisinopril from 20 mg daily to 40 mg daily.  I recommend that you purchase a blood pressure cuff over-the-counter and check your blood pressure 1-2 times a day and keep a log of this information.  Please continue your Metformin 500 mg twice a day as prescribed for your diabetes.  I also recommend that she purchase a glucometer over-the-counter and check your blood sugar in the morning and before every meal and keep a log of this as well.  I recommend that you avoid foods high in sugar and fat such as candy, sodas, pasta, potatoes, fast food.  I recommend that you call your doctor to schedule an appointment and take the information regarding your blood pressure and blood sugars to your doctor so they can adjust your medications if needed.  It is very important that you get your blood pressure and blood sugar under control.  If you do not have a primary care doctor, I have given you a list of primary care doctors below.  Please stop the amoxicillin that you were prescribed.  We are starting you on a different antibiotic called Omnicef (cefdinir) that you will take twice a day for the next 2 weeks.  You may take ibuprofen for pain, fever and Zofran as needed for nausea and vomiting.    Programe una cita con su mdico de atencin primaria, ya que necesitar un seguimiento minucioso de su presin arterial alta no controlada y diabetes. Estamos aumentando su lisinopril de 20 mg al da a 40 mg al [employer]. Le recomiendo que compre un brazalete de presin arterial de venta libre y controle su presin arterial 1-2 veces al da y [place] un registro de esta informacin. Contine con su metformina 500 mg dos veces al da segn lo prescrito para su diabetes. Tambin le recomiendo que compre un glucmetro de venta libre y controle su nivel de azcar en la  sangre por la maana y antes de cada comida y lleve un registro de esto tambin. Te recomiendo que evites alimentos con alto contenido de azcar y grasas como dulces, refrescos, pasta, patatas, comida rpida. Le recomiendo que llame a su mdico para programar una cita y llevar la informacin sobre su presin arterial y International aid/development worker en sangre a su mdico para que pueda ajustar sus medicamentos si es necesario. Es muy importante que controle la presin arterial y Nature conservation officer. Si no tiene un mdico de Marine scientist, a Psychologist, clinical proporcion una lista de mdicos de atencin primaria.  Deje de tomar la amoxicilina que Corporate treasurer. Le estamos administrando un antibitico Air cabin crew (cefdinir) que tomar [employer] al da durante las prximas 2 semanas. Puede tomar ibuprofeno para el dolor, la fiebre y Zofran segn sea necesario para las nuseas y los vmitos.   Steps to find a Primary Care Provider (PCP):  Call [phone] or [phone] to access "[place] Find a Doctor Service."  2.  You may also go on the [facility] website at [email or web address]  3.  [facility] also frequently accepts new patients.  [facility]  [address] [place] [address] [phone]  4.  There are also multiple [facility], [facility] and [facility] practices throughout the [place] that are frequently accepting new patients. You may find a clinic that is  close to your home and contact them.  [facility] [email or web address] [phone]  [facility] [email or web address]  [facility] [email or web address] [place] [email or web address] [phone]  5.  Local Health Departments also can provide primary care services.  [facility]  [place] [address] [phone]  [facility] [place] [address] [place] [facility] [place] [phone]

## 2019-06-02 NOTE — ED Notes (Signed)
Pt discharged from ED; instructions provided and scripts given; Pt encouraged to return to ED if symptoms worsen and to f/u with PCP; Pt verbalized understanding of all instructions 

## 2019-06-04 LAB — URINE CULTURE: Culture: >=100000 — AB

## 2019-06-05 ENCOUNTER — Telehealth: Payer: Self-pay | Admitting: Emergency Medicine

## 2019-06-05 NOTE — Telephone Encounter (Signed)
Post ED Visit - Positive Culture Follow-up  Culture report reviewed by antimicrobial stewardship pharmacist: [place] Team []  Elenor Quinones, Pharm.D. []  Heide Guile, Pharm.D., BCPS AQ-ID []  Parks Neptune, Pharm.D., BCPS []  Alycia Rossetti, Pharm.D., BCPS []  [employer], [place].D., BCPS, AAHIVP []  Legrand Como, Pharm.D., BCPS, AAHIVP []  Salome Arnt, PharmD, BCPS []  Johnnette Gourd, PharmD, BCPS []  Hughes Better, PharmD, BCPS []  Leeroy Cha, PharmD []  Laqueta Linden, PharmD, BCPS []  Albertina Parr, PharmD  [place] Team []  Leodis Sias, PharmD []  Lindell Spar, PharmD []  Royetta Asal, PharmD []  Graylin Shiver, Rph []  Rema Fendt) Glennon Mac, PharmD []  Arlyn Dunning, PharmD []  Netta Cedars, PharmD []  Dia Sitter, PharmD []  Leone Haven, PharmD []  Gretta Arab, PharmD []  Theodis Shove, PharmD []  Peggyann Juba, PharmD []  Reuel Boom, PharmD   Positive urine culture Treated with amoxicillin and cefdinir, organism sensitive to the same and no further patient follow-up is required at this time.  Hazle Nordmann 06/05/2019, 12:16 PM

## 2019-10-31 ENCOUNTER — Encounter: Payer: Self-pay | Admitting: Family Medicine

## 2019-10-31 ENCOUNTER — Other Ambulatory Visit: Payer: Self-pay

## 2019-10-31 ENCOUNTER — Ambulatory Visit: Payer: Self-pay | Attending: Family Medicine | Admitting: Family Medicine

## 2019-10-31 DIAGNOSIS — R109 Unspecified abdominal pain: Secondary | ICD-10-CM

## 2019-10-31 DIAGNOSIS — I10 Essential (primary) hypertension: Secondary | ICD-10-CM

## 2019-10-31 DIAGNOSIS — G44209 Tension-type headache, unspecified, not intractable: Secondary | ICD-10-CM

## 2019-10-31 DIAGNOSIS — E119 Type 2 diabetes mellitus without complications: Secondary | ICD-10-CM

## 2019-10-31 MED ORDER — CEFDINIR 300 MG PO CAPS: 300.0000 mg | ORAL_CAPSULE | Freq: Two times a day (BID) | ORAL | 0 refills | Status: DC

## 2019-10-31 MED ORDER — METFORMIN HCL 500 MG PO TABS: 500.0000 mg | ORAL_TABLET | Freq: Two times a day (BID) | ORAL | 3 refills | Status: DC

## 2019-10-31 MED ORDER — LISINOPRIL 40 MG PO TABS: 40.0000 mg | ORAL_TABLET | Freq: Every day | ORAL | 3 refills | Status: DC

## 2019-10-31 NOTE — Progress Notes (Signed)
Having white vaginal discharge with itching. Having pain in lower back.

## 2019-10-31 NOTE — Progress Notes (Signed)
Virtual Visit via Telephone Note  I connected with Rhonda Waller, on 10/31/2019 at 8:39 AM by telephone due to the COVID-19 pandemic and verified that I am speaking with the correct person using two identifiers.   Consent: I discussed the limitations, risks, security and privacy concerns of performing an evaluation and management service by telephone and the availability of in person appointments. I also discussed with the patient that there may be a patient responsible charge related to this service. The patient expressed understanding and agreed to proceed.   Location of Patient: Home  Location of Provider: Clinic   Persons participating in Telemedicine visit: Bonna Steury [facility] ID# [id number] - Interpreter Dr. Margarita Rana     History of Present Illness: She is a 44 year old female with a history of Hypertension, Gestational Diabetes (last A1c was 6.9 in 2014). She is currently not followed by an clinician but states she last saw a physician at the family practice clinic in 04/2019. She does not check her blood pressure routinely and has not been checking her blood sugars. Today she complains of right sided lumbar back pain 8/10 and intermittent headache. Denies sinus pressure or other sinus symptoms. She has had urinary frequency,dysuria, nausea with her right flank pain and in 05/2019 was treated for R pyelonephritis.  Past Medical History:  Diagnosis Date  . Diabetes mellitus without complication ([place]) 5631  . Pregnancy induced hypertension    No Known Allergies  Current Outpatient Medications on File Prior to Visit  Medication Sig Dispense Refill  . etonogestrel (NEXPLANON) 68 MG IMPL implant 1 each by Subdermal route once.    Marland Kitchen lisinopril (ZESTRIL) 40 MG tablet Take 1 tablet (40 mg total) by mouth daily. 30 tablet 1  . metFORMIN (GLUCOPHAGE) 500 MG tablet Take 500 mg by mouth 2 (two) times daily.    . Acetaminophen (TYLENOL PO) Take 1  tablet by mouth every 6 (six) hours as needed (pain/headache).    . cefdinir (OMNICEF) 300 MG capsule Take 1 capsule (300 mg total) by mouth 2 (two) times daily. (Patient not taking: Reported on 10/31/2019) 28 capsule 0  . ibuprofen (ADVIL) 800 MG tablet Take 1 tablet (800 mg total) by mouth every 8 (eight) hours as needed for mild pain. (Patient not taking: Reported on 10/31/2019) 30 tablet 0  . nystatin cream (MYCOSTATIN) Apply 1 application topically 2 (two) times daily as needed (rash/irritation).     . ondansetron (ZOFRAN ODT) 4 MG disintegrating tablet Take 1 tablet (4 mg total) by mouth every 6 (six) hours as needed. (Patient not taking: Reported on 10/31/2019) 20 tablet 0   No current facility-administered medications on file prior to visit.    Observations/Objective: Awake, alert, oriented x3 Not in acute distress  CMP Latest Ref Rng & Units 06/01/2019 05/03/2018 06/17/2013  Glucose 70 - 99 mg/dL 383(H) 365(H) 93  BUN 6 - 20 mg/dL 10 11 11   Creatinine 0.44 - 1.00 mg/dL 0.77 0.61 0.59  Sodium 135 - 145 mmol/L 129(L) 134(L) 134(L)  Potassium 3.5 - 5.1 mmol/L 3.9 3.9 4.4  Chloride 98 - 111 mmol/L 96(L) 101 104  CO2 22 - 32 mmol/L 23 23 22   Calcium 8.9 - 10.3 mg/dL 9.4 9.0 8.6  Total Protein 6.5 - 8.1 g/dL 6.9 7.5 6.5  Total Bilirubin 0.3 - 1.2 mg/dL 0.9 1.2 0.6  Alkaline Phos 38 - 126 U/L 128(H) 136(H) 245(H)  AST 15 - 41 U/L 19 18 35  ALT 0 - 44 U/L 23  20 18    Lipid Panel     Component Value Date/Time   CHOL 159 05/06/2010 2146   TRIG 192 (H) 05/06/2010 2146   HDL 50 05/06/2010 2146   CHOLHDL 3.2 Ratio 05/06/2010 2146   VLDL 38 05/06/2010 2146   LDLCALC 71 05/06/2010 2146    Lab Results  Component Value Date   HGBA1C 6.9 (H) 04/29/2013     Assessment and Plan: 1. Essential hypertension Refill lisinopril She will need labs to evaluate electrolytes and renal function Counseled on blood pressure goal of less than 130/80, low-sodium, DASH diet, medication compliance,  150 minutes of moderate intensity exercise per week. Discussed medication compliance, adverse effects.  - CMP14+EGFR; Future - lisinopril (ZESTRIL) 40 MG tablet; Take 1 tablet (40 mg total) by mouth daily.  Dispense: 30 tablet; Refill: 3  2. Type 2 diabetes mellitus without complication, without long-term current use of insulin (HCC) Last A1c was 6.9 in 2014 She will be due for an A1c which I have ordered Counseled on Diabetic diet, my plate method, 092 minutes of moderate intensity exercise/week Blood sugar logs with fasting goals of 80-120 mg/dl, random of less than 180 and in the event of sugars less than 60 mg/dl or greater than 400 mg/dl encouraged to notify the clinic. Advised on the need for annual eye exams, annual foot exams, Pneumonia vaccine. - Lipid panel; Future - Hemoglobin A1c; Future - Microalbumin / creatinine urine ratio; Future - metFORMIN (GLUCOPHAGE) 500 MG tablet; Take 1 tablet (500 mg total) by mouth 2 (two) times daily.  Dispense: 60 tablet; Refill: 3  3. Right flank pain We will treat presumptively for pyelonephritis given UTI symptoms along with flank pain She will come to the clinic tomorrow to drop off urine specimen - Urine Culture; Future - Urinalysis; Future - cefdinir (OMNICEF) 300 MG capsule; Take 1 capsule (300 mg total) by mouth 2 (two) times daily.  Dispense: 20 capsule; Refill: 0  4. Tension-type headache, not intractable, unspecified chronicity pattern Advised to use OTC NSAIDs   Follow Up Instructions: Return in about 3 months (around 01/30/2020), or if symptoms worsen or fail to improve.    I discussed the assessment and treatment plan with the patient. The patient was provided an opportunity to ask questions and all were answered. The patient agreed with the plan and demonstrated an understanding of the instructions.   The patient was advised to call back or seek an in-person evaluation if the symptoms worsen or if the condition fails to  improve as anticipated.     I provided 18 minutes total of non-face-to-face time during this encounter including median intraservice time, reviewing previous notes, investigations, ordering medications, medical decision making, coordinating care and patient verbalized understanding at the end of the visit.     Charlott Rakes, MD, FAAFP. [facility] [place], [place]   10/31/2019, 8:39 AM

## 2019-11-01 ENCOUNTER — Other Ambulatory Visit: Payer: Self-pay

## 2019-11-01 ENCOUNTER — Ambulatory Visit: Payer: Self-pay | Attending: Family Medicine

## 2019-11-01 DIAGNOSIS — I10 Essential (primary) hypertension: Secondary | ICD-10-CM

## 2019-11-01 DIAGNOSIS — R109 Unspecified abdominal pain: Secondary | ICD-10-CM

## 2019-11-01 DIAGNOSIS — E119 Type 2 diabetes mellitus without complications: Secondary | ICD-10-CM

## 2019-11-02 LAB — CMP14+EGFR
ALT: 19 IU/L (ref 0–32)
AST: 22 IU/L (ref 0–40)
Albumin/Globulin Ratio: 1.4 (ref 1.2–2.2)
Albumin: 4.2 g/dL (ref 3.8–4.8)
Alkaline Phosphatase: 164 IU/L — ABNORMAL HIGH (ref 39–117)
BUN/Creatinine Ratio: 15 (ref 9–23)
BUN: 10 mg/dL (ref 6–24)
Bilirubin Total: 0.7 mg/dL (ref 0.0–1.2)
CO2: 22 mmol/L (ref 20–29)
Calcium: 9.1 mg/dL (ref 8.7–10.2)
Chloride: 100 mmol/L (ref 96–106)
Creatinine, Ser: 0.67 mg/dL (ref 0.57–1.00)
GFR calc Af Amer: 123 mL/min/{1.73_m2} (ref >59)
GFR calc non Af Amer: 106 mL/min/{1.73_m2} (ref >59)
Globulin, Total: 2.9 g/dL (ref 1.5–4.5)
Glucose: 359 mg/dL — ABNORMAL HIGH (ref 65–99)
Potassium: 4.9 mmol/L (ref 3.5–5.2)
Sodium: 135 mmol/L (ref 134–144)
Total Protein: 7.1 g/dL (ref 6.0–8.5)

## 2019-11-02 LAB — URINALYSIS
Bilirubin, UA: NEGATIVE
Ketones, UA: NEGATIVE
Nitrite, UA: NEGATIVE
Specific Gravity, UA: >=1.03 — AB (ref 1.005–1.030)
Urobilinogen, Ur: 0.2 mg/dL (ref 0.2–1.0)
pH, UA: 6.5 (ref 5.0–7.5)

## 2019-11-02 LAB — HEMOGLOBIN A1C
Est. average glucose Bld gHb Est-mCnc: 324 mg/dL
Hgb A1c MFr Bld: 12.9 % — ABNORMAL HIGH (ref 4.8–5.6)

## 2019-11-02 LAB — LIPID PANEL
Chol/HDL Ratio: 2.6 ratio (ref 0.0–4.4)
Cholesterol, Total: 161 mg/dL (ref 100–199)
HDL: 61 mg/dL (ref >39)
LDL Chol Calc (NIH): 80 mg/dL (ref 0–99)
Triglycerides: 112 mg/dL (ref 0–149)
VLDL Cholesterol Cal: 20 mg/dL (ref 5–40)

## 2019-11-02 LAB — MICROALBUMIN / CREATININE URINE RATIO
Creatinine, Urine: 54.9 mg/dL
Microalb/Creat Ratio: 68 mg/g creat — ABNORMAL HIGH (ref 0–29)
Microalbumin, Urine: 37.5 ug/mL

## 2019-11-03 LAB — URINE CULTURE

## 2019-11-04 ENCOUNTER — Other Ambulatory Visit: Payer: Self-pay | Admitting: Family Medicine

## 2019-11-04 DIAGNOSIS — E119 Type 2 diabetes mellitus without complications: Secondary | ICD-10-CM

## 2019-11-04 MED ORDER — GLIPIZIDE 5 MG PO TABS: 5.0000 mg | ORAL_TABLET | Freq: Two times a day (BID) | ORAL | 3 refills | Status: DC

## 2019-11-04 MED ORDER — METFORMIN HCL 500 MG PO TABS: 1000.0000 mg | ORAL_TABLET | Freq: Two times a day (BID) | ORAL | 3 refills | Status: DC

## 2019-11-08 ENCOUNTER — Telehealth: Payer: Self-pay

## 2019-11-08 NOTE — Telephone Encounter (Signed)
Patient was called and a voicemail was left informing patient to return phone call for lab results. 

## 2019-11-08 NOTE — Telephone Encounter (Signed)
-----   Message from Enobong Newlin, MD sent at 11/04/2019  8:30 AM EDT ----- Labs reveal severely elevated urinary symptoms which could be as a result of uncontrolled diabetes mellitus coupled with a UTI.  She received an antibiotic prescription at her last visit with me and I have increased her dose of Metformin and added glipizide to her regimen.  Please advised to adhere to a diabetic diet. 

## 2019-11-14 ENCOUNTER — Telehealth: Payer: Self-pay

## 2019-11-14 NOTE — Telephone Encounter (Signed)
-----   Message from Hoy Register, MD sent at 11/04/2019  8:30 AM EDT ----- Labs reveal severely elevated urinary symptoms which could be as a result of uncontrolled diabetes mellitus coupled with a UTI.  She received an antibiotic prescription at her last visit with me and I have increased her dose of Metformin and added glipizide to her regimen.  Please advised to adhere to a diabetic diet.

## 2019-11-14 NOTE — Telephone Encounter (Signed)
Patient name and DOB has been verified Patient was informed of lab results. Patient had no questions.  

## 2019-12-12 ENCOUNTER — Emergency Department (HOSPITAL_COMMUNITY): Payer: Self-pay

## 2019-12-12 ENCOUNTER — Emergency Department (HOSPITAL_COMMUNITY)
Admission: EM | Admit: 2019-12-12 | Discharge: 2019-12-12 | Disposition: A | Discharge status: Home / Self Care | Payer: Self-pay | Attending: Emergency Medicine | Admitting: Emergency Medicine

## 2019-12-12 ENCOUNTER — Other Ambulatory Visit: Payer: Self-pay

## 2019-12-12 ENCOUNTER — Encounter (HOSPITAL_COMMUNITY): Payer: Self-pay | Admitting: Emergency Medicine

## 2019-12-12 DIAGNOSIS — Z79899 Other long term (current) drug therapy: Secondary | ICD-10-CM | POA: Insufficient documentation

## 2019-12-12 DIAGNOSIS — Z7984 Long term (current) use of oral hypoglycemic drugs: Secondary | ICD-10-CM | POA: Insufficient documentation

## 2019-12-12 DIAGNOSIS — I10 Essential (primary) hypertension: Secondary | ICD-10-CM | POA: Insufficient documentation

## 2019-12-12 DIAGNOSIS — E119 Type 2 diabetes mellitus without complications: Secondary | ICD-10-CM | POA: Insufficient documentation

## 2019-12-12 DIAGNOSIS — R0789 Other chest pain: Secondary | ICD-10-CM | POA: Insufficient documentation

## 2019-12-12 LAB — CBC
HCT: 39 % (ref 36.0–46.0)
Hemoglobin: 12.6 g/dL (ref 12.0–15.0)
MCH: 27.8 pg (ref 26.0–34.0)
MCHC: 32.3 g/dL (ref 30.0–36.0)
MCV: 86.1 fL (ref 80.0–100.0)
Platelets: 231 10*3/uL (ref 150–400)
RBC: 4.53 MIL/uL (ref 3.87–5.11)
RDW: 13.6 % (ref 11.5–15.5)
WBC: 7.7 10*3/uL (ref 4.0–10.5)
nRBC: 0 % (ref 0.0–0.2)

## 2019-12-12 LAB — I-STAT BETA HCG BLOOD, ED (MC, WL, AP ONLY): I-stat hCG, quantitative: <5 m[IU]/mL (ref <5)

## 2019-12-12 LAB — BASIC METABOLIC PANEL
Anion gap: 9 (ref 5–15)
BUN: 11 mg/dL (ref 6–20)
CO2: 24 mmol/L (ref 22–32)
Calcium: 8.8 mg/dL — ABNORMAL LOW (ref 8.9–10.3)
Chloride: 103 mmol/L (ref 98–111)
Creatinine, Ser: 0.64 mg/dL (ref 0.44–1.00)
GFR calc Af Amer: >60 mL/min (ref >60)
GFR calc non Af Amer: >60 mL/min (ref >60)
Glucose, Bld: 298 mg/dL — ABNORMAL HIGH (ref 70–99)
Potassium: 4.4 mmol/L (ref 3.5–5.1)
Sodium: 136 mmol/L (ref 135–145)

## 2019-12-12 LAB — TROPONIN I (HIGH SENSITIVITY)
Troponin I (High Sensitivity): 5 ng/L (ref <18)
Troponin I (High Sensitivity): 4 ng/L (ref <18)

## 2019-12-12 LAB — D-DIMER, QUANTITATIVE: D-Dimer, Quant: 0.29 ug/mL-FEU (ref 0.00–0.50)

## 2019-12-12 MED ORDER — SODIUM CHLORIDE 0.9% FLUSH
3.0000 mL | Freq: Once | INTRAVENOUS | Status: AC
  Administered 2019-12-12: 3 mL via INTRAVENOUS

## 2019-12-12 MED ORDER — KETOROLAC TROMETHAMINE 30 MG/ML IJ SOLN
30.0000 mg | Freq: Once | INTRAMUSCULAR | Status: AC
  Administered 2019-12-12: 30 mg via INTRAVENOUS
  Filled 2019-12-12: qty 1

## 2019-12-12 MED ORDER — NAPROXEN 500 MG PO TABS
500.0000 mg | ORAL_TABLET | Freq: Two times a day (BID) | ORAL | 0 refills | Status: DC
Start: 2019-12-12 — End: 2020-02-29

## 2019-12-12 MED ORDER — ASPIRIN 81 MG PO CHEW
324.0000 mg | CHEWABLE_TABLET | Freq: Once | ORAL | Status: AC
  Administered 2019-12-12: 324 mg via ORAL
  Filled 2019-12-12: qty 4

## 2019-12-12 NOTE — ED Provider Notes (Signed)
[facility] EMERGENCY DEPARTMENT Provider Note   CSN: [id number] Arrival date & time: 12/12/19  4098   History Chief Complaint  Patient presents with  . Chest Pain    Rhonda Waller is a 44 y.o. female.  The history is provided by the patient. A language interpreter was used.  Chest Pain She has history of hypertension, diabetes, obesity and comes in because of chest pain for the last 3 days.  Pain is described as a dull, throbbing pain in the left side of the chest and in the left arm.  There was some associated dyspnea and nausea but no vomiting.  She denies diaphoresis.  There has been a slight cough which is nonproductive.  She denies fever or chills.  Pain is slightly worse if she takes a deep breath but nothing else seems to affect it.  She denies any recent travel or surgery but does have Nexplanon implant.  She does admit to occasionally missing doses of her blood pressure medication.  She is a non-smoker and denies history of elevated cholesterol.  There is no family history of premature coronary atherosclerosis.  Past Medical History:  Diagnosis Date  . Diabetes mellitus without complication (HCC) 2012  . Pregnancy induced hypertension     Patient Active Problem List   Diagnosis Date Noted  . GBS (group B streptococcus) UTI complicating pregnancy 06/19/2015  . Abortion, missed 06/17/2015  . Essential hypertension 06/17/2015  . Missed abortion 06/17/2015  . Abnormal liver function tests 05/07/2013  . Obesity 05/06/2013  . KNEE PAIN, BILATERAL 10/05/2010  . OBESITY 04/09/2010  . HYPERTENSION, BENIGN ESSENTIAL 04/09/2010  . Type 2 diabetes mellitus (HCC) 07/25/2009    Past Surgical History:  Procedure Laterality Date  . CHOLECYSTECTOMY       OB History    Gravida  7   Para  5   Term  5   Preterm  0   AB  1   Living  5     SAB  1   TAB  0   Ectopic  0   Multiple  0   Live Births  5           Family History   Problem Relation Age of Onset  . Diabetes Mother   . Diabetes Maternal Uncle     Social History   Tobacco Use  . Smoking status: Never Smoker  . Smokeless tobacco: Never Used  Substance Use Topics  . Alcohol use: No  . Drug use: No    Home Medications Prior to Admission medications   Medication Sig Start Date End Date Taking? Authorizing Provider  Acetaminophen (TYLENOL PO) Take 1 tablet by mouth every 6 (six) hours as needed (pain/headache).    [provider]  cefdinir (OMNICEF) 300 MG capsule Take 1 capsule (300 mg total) by mouth 2 (two) times daily. 10/31/19   Hoy Register, MD  etonogestrel (NEXPLANON) 68 MG IMPL implant 1 each by Subdermal route once.    [provider]  glipiZIDE (GLUCOTROL) 5 MG tablet Take 1 tablet (5 mg total) by mouth 2 (two) times daily before a meal. 11/04/19   Hoy Register, MD  ibuprofen (ADVIL) 800 MG tablet Take 1 tablet (800 mg total) by mouth every 8 (eight) hours as needed for mild pain. Patient not taking: Reported on 10/31/2019 06/02/19   Ward, Layla Maw, DO  lisinopril (ZESTRIL) 40 MG tablet Take 1 tablet (40 mg total) by mouth daily. 10/31/19   Newlin, Odette Horns,  MD  metFORMIN (GLUCOPHAGE) 500 MG tablet Take 2 tablets (1,000 mg total) by mouth 2 (two) times daily. 11/04/19   Charlott Rakes, MD  nystatin cream (MYCOSTATIN) Apply 1 application topically 2 (two) times daily as needed (rash/irritation).  04/16/19   [provider]  ondansetron (ZOFRAN ODT) 4 MG disintegrating tablet Take 1 tablet (4 mg total) by mouth every 6 (six) hours as needed. Patient not taking: Reported on 10/31/2019 06/02/19   Ward, Delice Bison, DO    Allergies    Patient has no known allergies.  Review of Systems   Review of Systems  Cardiovascular: Positive for chest pain.  All other systems reviewed and are negative.   Physical Exam Updated Vital Signs BP (!) 182/92   Pulse 80   Temp 98.9 F (37.2 C) (Oral)   Resp 18   Wt 83 kg   SpO2 99%    BMI 36.96 kg/m   Physical Exam Vitals and nursing note reviewed.   44 year old female, resting comfortably and in no acute distress. Vital signs are significant for elevated blood pressure. Oxygen saturation is 99%, which is normal. Head is normocephalic and atraumatic. PERRLA, EOMI. Oropharynx is clear. Neck is nontender and supple without adenopathy or JVD. Back is nontender and there is no CVA tenderness. Lungs are clear without rales, wheezes, or rhonchi. Chest is moderately tender without crepitus. Heart has regular rate and rhythm without murmur. Abdomen is soft, flat, nontender without masses or hepatosplenomegaly and peristalsis is normoactive. Extremities have no cyanosis or edema, full range of motion is present. Skin is warm and dry without rash. Neurologic: Mental status is normal, cranial nerves are intact, there are no motor or sensory deficits.  ED Results / Procedures / Treatments   Labs (all labs ordered are listed, but only abnormal results are displayed) Labs Reviewed  BASIC METABOLIC PANEL - Abnormal; Notable for the following components:      Result Value   Glucose, Bld 298 (*)    Calcium 8.8 (*)    All other components within normal limits  CBC  D-DIMER, QUANTITATIVE (NOT AT [facility])  I-STAT BETA HCG BLOOD, ED ([facility], [facility], [facility] ONLY)  TROPONIN I (HIGH SENSITIVITY)  TROPONIN I (HIGH SENSITIVITY)    EKG EKG Interpretation  Date/Time:  Thursday Dec 12 2019 03:40:04 EDT Ventricular Rate:  87 PR Interval:  144 QRS Duration: 86 QT Interval:  372 QTC Calculation: 447 R Axis:   -4 Text Interpretation: Normal sinus rhythm Nonspecific T wave abnormality Abnormal ECG When compared with ECG of 06/04/2009, Nonspecific T wave abnormality is now present Confirmed by Delora Fuel ([id number]) on 12/12/2019 4:09:15 AM   Radiology DG Chest 2 View  Result Date: 12/12/2019 CLINICAL DATA:  Initial evaluation for acute chest pain. EXAM: CHEST - 2 VIEW COMPARISON:  Prior  radiograph from 06/04/2009. FINDINGS: Transverse heart size within normal limits. Mediastinal silhouette normal. Lungs normally inflated. No focal infiltrates. No edema or effusion. No pneumothorax. No acute osseous finding. IMPRESSION: No active cardiopulmonary disease. Electronically Signed   By: Jeannine Boga M.D.   On: 12/12/2019 04:47    Procedures Procedures  Medications Ordered in ED Medications  sodium chloride flush (NS) 0.9 % injection 3 mL (has no administration in time range)  aspirin chewable tablet 324 mg (has no administration in time range)  ketorolac (TORADOL) 30 MG/ML injection 30 mg (30 mg Intravenous Given 12/12/19 0503)    ED Course  I have reviewed the triage vital signs and the nursing  notes.  Pertinent labs & imaging results that were available during my care of the patient were reviewed by me and considered in my medical decision making (see chart for details).  MDM Rules/Calculators/A&P Chest pain of uncertain cause.  Characterization does not sound worrisome for cardiac.  However, ECG does have some minor T wave flattening throughout.  She has at risk for pulmonary embolism with exogenous estrogens, so will screen with D-dimer.  However, pain seems most likely to be chest wall pain and will give therapeutic trial of ketorolac.  Will check chest x-ray to rule out pneumonia.  Old records are reviewed, and she has no relevant past visits.  Chest x-ray is unremarkable and D-dimer is normal.  She had good relief of pain with ketorolac.  Pain seems clearly musculoskeletal at this point.  She is discharged with prescription for naproxen.  Follow-up with her primary care provider regarding blood pressure control.  Of note, blood pressure has come down substantially while in the emergency department.  Final Clinical Impression(s) / ED Diagnoses Final diagnoses:  Chest wall pain  Elevated blood pressure reading with diagnosis of hypertension    Rx / DC Orders ED  Discharge Orders         Ordered    naproxen (NAPROSYN) 500 MG tablet  2 times daily     12/12/19 0651           Dione Booze, MD 12/12/19 [phone]

## 2019-12-12 NOTE — ED Triage Notes (Signed)
Spanish interpreter used. Pt in w/cp and L arm pain x 3 days. States whole body tingling. Pain worse w/deep breaths. BP 213/98, states she's taking all bp meds as prescribed. C/o nausea

## 2020-02-29 ENCOUNTER — Emergency Department (HOSPITAL_COMMUNITY): Payer: Self-pay

## 2020-02-29 ENCOUNTER — Inpatient Hospital Stay (HOSPITAL_COMMUNITY)
Admission: EM | Admit: 2020-02-29 | Discharge: 2020-03-04 | DRG: 872 | Disposition: A | Discharge status: Home / Self Care | Payer: Self-pay | Attending: Internal Medicine | Admitting: Internal Medicine

## 2020-02-29 ENCOUNTER — Encounter (HOSPITAL_COMMUNITY): Payer: Self-pay | Admitting: Emergency Medicine

## 2020-02-29 ENCOUNTER — Other Ambulatory Visit: Payer: Self-pay

## 2020-02-29 DIAGNOSIS — E876 Hypokalemia: Secondary | ICD-10-CM | POA: Diagnosis present

## 2020-02-29 DIAGNOSIS — A4151 Sepsis due to Escherichia coli [E. coli]: Principal | ICD-10-CM | POA: Diagnosis present

## 2020-02-29 DIAGNOSIS — Z9049 Acquired absence of other specified parts of digestive tract: Secondary | ICD-10-CM

## 2020-02-29 DIAGNOSIS — E669 Obesity, unspecified: Secondary | ICD-10-CM | POA: Diagnosis present

## 2020-02-29 DIAGNOSIS — E871 Hypo-osmolality and hyponatremia: Secondary | ICD-10-CM | POA: Diagnosis present

## 2020-02-29 DIAGNOSIS — E860 Dehydration: Secondary | ICD-10-CM | POA: Diagnosis present

## 2020-02-29 DIAGNOSIS — N12 Tubulo-interstitial nephritis, not specified as acute or chronic: Secondary | ICD-10-CM | POA: Diagnosis present

## 2020-02-29 DIAGNOSIS — N179 Acute kidney failure, unspecified: Secondary | ICD-10-CM | POA: Diagnosis present

## 2020-02-29 DIAGNOSIS — Z6836 Body mass index (BMI) 36.0-36.9, adult: Secondary | ICD-10-CM

## 2020-02-29 DIAGNOSIS — R109 Unspecified abdominal pain: Secondary | ICD-10-CM

## 2020-02-29 DIAGNOSIS — R9431 Abnormal electrocardiogram [ECG] [EKG]: Secondary | ICD-10-CM | POA: Diagnosis present

## 2020-02-29 DIAGNOSIS — N2889 Other specified disorders of kidney and ureter: Secondary | ICD-10-CM | POA: Diagnosis present

## 2020-02-29 DIAGNOSIS — A419 Sepsis, unspecified organism: Secondary | ICD-10-CM | POA: Diagnosis present

## 2020-02-29 DIAGNOSIS — Z87440 Personal history of urinary (tract) infections: Secondary | ICD-10-CM

## 2020-02-29 DIAGNOSIS — I10 Essential (primary) hypertension: Secondary | ICD-10-CM | POA: Diagnosis present

## 2020-02-29 DIAGNOSIS — Z20822 Contact with and (suspected) exposure to covid-19: Secondary | ICD-10-CM | POA: Diagnosis present

## 2020-02-29 DIAGNOSIS — Z7984 Long term (current) use of oral hypoglycemic drugs: Secondary | ICD-10-CM

## 2020-02-29 DIAGNOSIS — E119 Type 2 diabetes mellitus without complications: Secondary | ICD-10-CM

## 2020-02-29 DIAGNOSIS — R17 Unspecified jaundice: Secondary | ICD-10-CM | POA: Diagnosis present

## 2020-02-29 LAB — URINALYSIS, ROUTINE W REFLEX MICROSCOPIC
Bilirubin Urine: NEGATIVE
Glucose, UA: >=500 mg/dL — AB
Ketones, ur: 20 mg/dL — AB
Nitrite: NEGATIVE
Protein, ur: 100 mg/dL — AB
Specific Gravity, Urine: 1.025 (ref 1.005–1.030)
WBC, UA: >50 WBC/hpf — ABNORMAL HIGH (ref 0–5)
pH: 5 (ref 5.0–8.0)

## 2020-02-29 LAB — I-STAT BETA HCG BLOOD, ED (MC, WL, AP ONLY): I-stat hCG, quantitative: <5 m[IU]/mL (ref <5)

## 2020-02-29 LAB — HEPATITIS PANEL, ACUTE
HCV Ab: NONREACTIVE
Hep A IgM: NONREACTIVE
Hep B C IgM: NONREACTIVE
Hepatitis B Surface Ag: NONREACTIVE

## 2020-02-29 LAB — COMPREHENSIVE METABOLIC PANEL
ALT: 38 U/L (ref 0–44)
AST: 52 U/L — ABNORMAL HIGH (ref 15–41)
Albumin: 3.3 g/dL — ABNORMAL LOW (ref 3.5–5.0)
Alkaline Phosphatase: 166 U/L — ABNORMAL HIGH (ref 38–126)
Anion gap: 14 (ref 5–15)
BUN: 11 mg/dL (ref 6–20)
CO2: 18 mmol/L — ABNORMAL LOW (ref 22–32)
Calcium: 8.8 mg/dL — ABNORMAL LOW (ref 8.9–10.3)
Chloride: 100 mmol/L (ref 98–111)
Creatinine, Ser: 0.83 mg/dL (ref 0.44–1.00)
GFR calc Af Amer: >60 mL/min (ref >60)
GFR calc non Af Amer: >60 mL/min (ref >60)
Glucose, Bld: 443 mg/dL — ABNORMAL HIGH (ref 70–99)
Potassium: 4 mmol/L (ref 3.5–5.1)
Sodium: 132 mmol/L — ABNORMAL LOW (ref 135–145)
Total Bilirubin: 1.5 mg/dL — ABNORMAL HIGH (ref 0.3–1.2)
Total Protein: 7 g/dL (ref 6.5–8.1)

## 2020-02-29 LAB — CBC
HCT: 38.3 % (ref 36.0–46.0)
HCT: 36.1 % (ref 36.0–46.0)
Hemoglobin: 12.6 g/dL (ref 12.0–15.0)
Hemoglobin: 11.6 g/dL — ABNORMAL LOW (ref 12.0–15.0)
MCH: 28.1 pg (ref 26.0–34.0)
MCH: 27.8 pg (ref 26.0–34.0)
MCHC: 32.9 g/dL (ref 30.0–36.0)
MCHC: 32.1 g/dL (ref 30.0–36.0)
MCV: 85.5 fL (ref 80.0–100.0)
MCV: 86.4 fL (ref 80.0–100.0)
Platelets: 229 10*3/uL (ref 150–400)
Platelets: 166 10*3/uL (ref 150–400)
RBC: 4.48 MIL/uL (ref 3.87–5.11)
RBC: 4.18 MIL/uL (ref 3.87–5.11)
RDW: 13.1 % (ref 11.5–15.5)
RDW: 13.4 % (ref 11.5–15.5)
WBC: 14.7 10*3/uL — ABNORMAL HIGH (ref 4.0–10.5)
WBC: 8.1 10*3/uL (ref 4.0–10.5)
nRBC: 0 % (ref 0.0–0.2)
nRBC: 0 % (ref 0.0–0.2)

## 2020-02-29 LAB — LACTIC ACID, PLASMA: Lactic Acid, Venous: 1.9 mmol/L (ref 0.5–1.9)

## 2020-02-29 LAB — CREATININE, SERUM
Creatinine, Ser: 1.3 mg/dL — ABNORMAL HIGH (ref 0.44–1.00)
GFR calc Af Amer: 57 mL/min — ABNORMAL LOW (ref >60)
GFR calc non Af Amer: 49 mL/min — ABNORMAL LOW (ref >60)

## 2020-02-29 LAB — GLUCOSE, CAPILLARY
Glucose-Capillary: 315 mg/dL — ABNORMAL HIGH (ref 70–99)
Glucose-Capillary: 331 mg/dL — ABNORMAL HIGH (ref 70–99)
Glucose-Capillary: 369 mg/dL — ABNORMAL HIGH (ref 70–99)
Glucose-Capillary: 411 mg/dL — ABNORMAL HIGH (ref 70–99)

## 2020-02-29 LAB — HEMOGLOBIN A1C
Hgb A1c MFr Bld: 12.7 % — ABNORMAL HIGH (ref 4.8–5.6)
Mean Plasma Glucose: 317.79 mg/dL

## 2020-02-29 LAB — CBG MONITORING, ED: Glucose-Capillary: 465 mg/dL — ABNORMAL HIGH (ref 70–99)

## 2020-02-29 LAB — SARS CORONAVIRUS 2 BY RT PCR (HOSPITAL ORDER, PERFORMED IN ~~LOC~~ HOSPITAL LAB): SARS Coronavirus 2: NEGATIVE

## 2020-02-29 LAB — LIPASE, BLOOD: Lipase: 21 U/L (ref 11–51)

## 2020-02-29 LAB — PROCALCITONIN: Procalcitonin: 45.48 ng/mL

## 2020-02-29 LAB — MAGNESIUM: Magnesium: 1.8 mg/dL (ref 1.7–2.4)

## 2020-02-29 LAB — PHOSPHORUS: Phosphorus: 1.3 mg/dL — ABNORMAL LOW (ref 2.5–4.6)

## 2020-02-29 LAB — HIV ANTIBODY (ROUTINE TESTING W REFLEX): HIV Screen 4th Generation wRfx: NONREACTIVE

## 2020-02-29 LAB — GLUCOSE, RANDOM: Glucose, Bld: 482 mg/dL — ABNORMAL HIGH (ref 70–99)

## 2020-02-29 MED ORDER — INSULIN ASPART 100 UNIT/ML ~~LOC~~ SOLN: 0.0000 [IU] | Freq: Every day | SUBCUTANEOUS | Status: DC

## 2020-02-29 MED ORDER — HYDRALAZINE HCL 20 MG/ML IJ SOLN
10.0000 mg | Freq: Four times a day (QID) | INTRAMUSCULAR | Status: DC | PRN
  Administered 2020-03-03 – 2020-03-04 (×3): 10 mg via INTRAVENOUS
  Filled 2020-02-29 (×3): qty 1

## 2020-02-29 MED ORDER — ACETAMINOPHEN 325 MG PO TABS
650.0000 mg | ORAL_TABLET | Freq: Four times a day (QID) | ORAL | Status: DC | PRN
  Administered 2020-03-01 – 2020-03-04 (×9): 650 mg via ORAL
  Filled 2020-02-29 (×9): qty 2

## 2020-02-29 MED ORDER — ACETAMINOPHEN 500 MG PO TABS
500.0000 mg | ORAL_TABLET | Freq: Once | ORAL | Status: AC
  Administered 2020-02-29: 500 mg via ORAL
  Filled 2020-02-29: qty 1

## 2020-02-29 MED ORDER — SODIUM CHLORIDE 0.9 % IV BOLUS
1000.0000 mL | Freq: Once | INTRAVENOUS | Status: AC
  Administered 2020-02-29: 1000 mL via INTRAVENOUS

## 2020-02-29 MED ORDER — INSULIN ASPART 100 UNIT/ML IV SOLN
10.0000 [IU] | Freq: Once | INTRAVENOUS | Status: AC
  Administered 2020-02-29: 10 [IU] via SUBCUTANEOUS

## 2020-02-29 MED ORDER — INSULIN ASPART 100 UNIT/ML ~~LOC~~ SOLN
0.0000 [IU] | SUBCUTANEOUS | Status: DC
  Administered 2020-02-29 (×2): 7 [IU] via SUBCUTANEOUS
  Administered 2020-03-01: 3 [IU] via SUBCUTANEOUS
  Administered 2020-03-01: 4 [IU] via SUBCUTANEOUS
  Administered 2020-03-01: 3 [IU] via SUBCUTANEOUS
  Administered 2020-03-01: 5 [IU] via SUBCUTANEOUS
  Administered 2020-03-01: 3 [IU] via SUBCUTANEOUS
  Administered 2020-03-02: 2 [IU] via SUBCUTANEOUS
  Administered 2020-03-02: 3 [IU] via SUBCUTANEOUS
  Administered 2020-03-02 (×2): 2 [IU] via SUBCUTANEOUS
  Administered 2020-03-02 (×2): 3 [IU] via SUBCUTANEOUS
  Administered 2020-03-03: 5 [IU] via SUBCUTANEOUS
  Administered 2020-03-03: 1 [IU] via SUBCUTANEOUS
  Administered 2020-03-03: 2 [IU] via SUBCUTANEOUS
  Administered 2020-03-03 (×2): 3 [IU] via SUBCUTANEOUS
  Administered 2020-03-03: 5 [IU] via SUBCUTANEOUS
  Administered 2020-03-04 (×3): 2 [IU] via SUBCUTANEOUS
  Administered 2020-03-04: 3 [IU] via SUBCUTANEOUS

## 2020-02-29 MED ORDER — ACETAMINOPHEN 650 MG RE SUPP: 650.0000 mg | Freq: Four times a day (QID) | RECTAL | Status: DC | PRN

## 2020-02-29 MED ORDER — SODIUM CHLORIDE 0.9 % IV SOLN
1.0000 g | INTRAVENOUS | Status: DC
  Administered 2020-02-29: 1 g via INTRAVENOUS
  Filled 2020-02-29 (×3): qty 10

## 2020-02-29 MED ORDER — LISINOPRIL 40 MG PO TABS
40.0000 mg | ORAL_TABLET | Freq: Every day | ORAL | Status: DC
  Filled 2020-02-29: qty 1

## 2020-02-29 MED ORDER — ASPIRIN EC 325 MG PO TBEC: 325.0000 mg | DELAYED_RELEASE_TABLET | Freq: Every day | ORAL | Status: DC | PRN

## 2020-02-29 MED ORDER — OXYCODONE HCL 5 MG PO TABS
5.0000 mg | ORAL_TABLET | ORAL | Status: DC | PRN
  Administered 2020-03-01 – 2020-03-04 (×10): 5 mg via ORAL
  Filled 2020-02-29 (×10): qty 1

## 2020-02-29 MED ORDER — SODIUM CHLORIDE 0.9 % IV SOLN: INTRAVENOUS | Status: DC

## 2020-02-29 MED ORDER — SODIUM CHLORIDE 0.9 % IV BOLUS
500.0000 mL | Freq: Once | INTRAVENOUS | Status: AC
  Administered 2020-02-29: 500 mL via INTRAVENOUS

## 2020-02-29 MED ORDER — ENOXAPARIN SODIUM 40 MG/0.4ML ~~LOC~~ SOLN
40.0000 mg | SUBCUTANEOUS | Status: DC
  Administered 2020-02-29: 40 mg via SUBCUTANEOUS
  Filled 2020-02-29 (×2): qty 0.4

## 2020-02-29 MED ORDER — SODIUM CHLORIDE 0.9 % IV SOLN: 1.0000 g | INTRAVENOUS | Status: DC

## 2020-02-29 MED ORDER — ACETAMINOPHEN 325 MG PO TABS
650.0000 mg | ORAL_TABLET | Freq: Once | ORAL | Status: AC
  Administered 2020-02-29: 650 mg via ORAL
  Filled 2020-02-29: qty 2

## 2020-02-29 MED ORDER — INSULIN ASPART 100 UNIT/ML ~~LOC~~ SOLN: 0.0000 [IU] | Freq: Three times a day (TID) | SUBCUTANEOUS | Status: DC

## 2020-02-29 MED ORDER — INSULIN ASPART 100 UNIT/ML IV SOLN: 10.0000 [IU] | Freq: Once | INTRAVENOUS | Status: DC

## 2020-02-29 MED ORDER — MAGNESIUM SULFATE 2 GM/50ML IV SOLN
2.0000 g | Freq: Once | INTRAVENOUS | Status: AC
  Administered 2020-02-29: 2 g via INTRAVENOUS
  Filled 2020-02-29: qty 50

## 2020-02-29 MED ORDER — LORAZEPAM 2 MG/ML IJ SOLN
0.5000 mg | Freq: Once | INTRAMUSCULAR | Status: AC
  Administered 2020-02-29: 0.5 mg via INTRAVENOUS
  Filled 2020-02-29: qty 1

## 2020-02-29 NOTE — H&P (Addendum)
History and Physical    Josalyn Dettmann EML:[id number] DOB: April 16, 1976 DOA: 02/29/2020  PCP: Hoy Register, MD  Patient coming from: Home I have personally briefly reviewed patient's old medical records in [facility]  Chief Complaint: Fever, chills, right flank pain since 2 days.  HPI: Rhonda Waller is a 44 y.o. female with medical history significant of hypertension, diabetes, obesity presents to emergency department with fever, chills, right flank pain since 2 days.  Patient tells me that her symptoms started 2 days ago with fever, chills, right flank pain, lower abdominal pain, dysuria and foul-smelling urine associated with nausea, vomiting, generalized weakness, lethargy and decreased appetite.  She had history of UTI in the past, denies history of renal stones, hematuria, headache, blurry vision, chest pain, shortness of breath, palpitation, leg swelling, weight loss, bowel changes.  She is sexually active with her boyfriend reports foul-smelling vaginal discharge since 1 week, denies previous history of STD, LMP: Irregular cycles every 3 months.  She has 2 kids.  No history of smoking, alcohol, licit drug use.  ED Course: Upon arrival to ED: Patient had fever of 102.9, tachycardic, tachypneic, blood pressure elevated, leukocytosis of 14.7, CMP shows sodium of 132, alkaline phosphatase 166, total bilirubin: 1.5, lipase, magnesium, lactic acid: WNL, UA is positive for leukocytes, WBCs, CT renal stone shows right kidney pyelonephritis-no evidence of renal or ureteral stones.  Patient was given a dose of IV Rocephin in ED.  [facility] consulted for admission for sepsis secondary to pyelonephritis.  Review of Systems: As per HPI otherwise negative.    Past Medical History:  Diagnosis Date  . Diabetes mellitus without complication (HCC) 2012  . Pregnancy induced hypertension     Past Surgical History:  Procedure Laterality Date  . CHOLECYSTECTOMY        reports that she has never smoked. She has never used smokeless tobacco. She reports that she does not drink alcohol and does not use drugs.  No Known Allergies  Family History  Problem Relation Age of Onset  . Diabetes Mother   . Diabetes Maternal Uncle     Prior to Admission medications   Medication Sig Start Date End Date Taking? Authorizing Provider  acetaminophen (TYLENOL) 325 MG tablet Take 325 mg by mouth 2 (two) times daily as needed for mild pain or moderate pain.    [provider]  etonogestrel (NEXPLANON) 68 MG IMPL implant 1 each by Subdermal route once.    [provider]  glipiZIDE (GLUCOTROL) 5 MG tablet Take 1 tablet (5 mg total) by mouth 2 (two) times daily before a meal. 11/04/19   Hoy Register, MD  lisinopril (ZESTRIL) 40 MG tablet Take 1 tablet (40 mg total) by mouth daily. 10/31/19   Hoy Register, MD  metFORMIN (GLUCOPHAGE) 500 MG tablet Take 2 tablets (1,000 mg total) by mouth 2 (two) times daily. 11/04/19   Hoy Register, MD  naproxen (NAPROSYN) 500 MG tablet Take 1 tablet (500 mg total) by mouth 2 (two) times daily. 12/12/19   Dione Booze, MD  nystatin cream (MYCOSTATIN) Apply 1 application topically 2 (two) times daily as needed (rash/irritation).  04/16/19   [provider]  ondansetron (ZOFRAN ODT) 4 MG disintegrating tablet Take 1 tablet (4 mg total) by mouth every 6 (six) hours as needed. Patient taking differently: Take 4 mg by mouth every 6 (six) hours as needed for nausea or vomiting.  06/02/19   Ward, Layla Maw, DO    Physical Exam: Vitals:   02/29/20  [id number] 02/29/20 0639 02/29/20 1117 02/29/20 1252  BP: (!) 172/98 (!) 168/92 (!) 154/91 (!) 169/88  Pulse: (!) 144 (!) 122 (!) 132 (!) 143  Resp: 20 20 (!) 23 (!) 28  Temp: (!) 102.8 F (39.3 C) (!) 101.7 F (38.7 C) 100.2 F (37.9 C) (!) 102.9 F (39.4 C)  TempSrc: Oral Oral Oral Oral  SpO2: 96% 94% 99% 96%    Constitutional: NAD, calm, comfortable, on room air, obese,  appears weak and lethargic Eyes: PERRL, lids and conjunctivae normal ENMT: Mucous membranes are dry. Posterior pharynx clear of any exudate or lesions.Normal dentition.  Neck: normal, supple, no masses, no thyromegaly Respiratory: clear to auscultation bilaterally, no wheezing, no crackles. Normal respiratory effort. No accessory muscle use.  Cardiovascular: Regular rate and rhythm, no murmurs / rubs / gallops. No extremity edema. 2+ pedal pulses. No carotid bruits.  Abdomen: no tenderness, no masses palpated. No hepatosplenomegaly. Bowel sounds positive.  Musculoskeletal: no clubbing / cyanosis. No joint deformity upper and lower extremities. Good ROM, no contractures. Normal muscle tone.  Right CVA tenderness positive Skin: no rashes, lesions, ulcers. No induration Neurologic: CN 2-12 grossly intact. Sensation intact, DTR normal. Strength 5/5 in all 4.  Psychiatric: Normal judgment and insight. Alert and oriented x 3. Normal mood.    Labs on Admission: I have personally reviewed following labs and imaging studies  CBC: Recent Labs  Lab 02/29/20 0418  WBC 14.7*  HGB 12.6  HCT 38.3  MCV 85.5  PLT 229   Basic Metabolic Panel: Recent Labs  Lab 02/29/20 0418 02/29/20 1108  NA 132*  --   K 4.0  --   CL 100  --   CO2 18*  --   GLUCOSE 443*  --   BUN 11  --   CREATININE 0.83  --   CALCIUM 8.8*  --   MG  --  1.8   GFR: CrCl cannot be calculated (Unknown ideal weight.). Liver Function Tests: Recent Labs  Lab 02/29/20 0418  AST 52*  ALT 38  ALKPHOS 166*  BILITOT 1.5*  PROT 7.0  ALBUMIN 3.3*   Recent Labs  Lab 02/29/20 0418  LIPASE 21   No results for input(s): AMMONIA in the last 168 hours. Coagulation Profile: No results for input(s): INR, PROTIME in the last 168 hours. Cardiac Enzymes: No results for input(s): CKTOTAL, CKMB, CKMBINDEX, TROPONINI in the last 168 hours. BNP (last 3 results) No results for input(s): PROBNP in the last 8760 hours. HbA1C: No  results for input(s): HGBA1C in the last 72 hours. CBG: No results for input(s): GLUCAP in the last 168 hours. Lipid Profile: No results for input(s): CHOL, HDL, LDLCALC, TRIG, CHOLHDL, LDLDIRECT in the last 72 hours. Thyroid Function Tests: No results for input(s): TSH, T4TOTAL, FREET4, T3FREE, THYROIDAB in the last 72 hours. Anemia Panel: No results for input(s): VITAMINB12, FOLATE, FERRITIN, TIBC, IRON, RETICCTPCT in the last 72 hours. Urine analysis:    Component Value Date/Time   COLORURINE AMBER (A) 02/29/2020 1204   APPEARANCEUR CLOUDY (A) 02/29/2020 1204   APPEARANCEUR Clear 11/01/2019 0839   LABSPEC 1.025 02/29/2020 1204   PHURINE 5.0 02/29/2020 1204   GLUCOSEU >=500 (A) 02/29/2020 1204   HGBUR LARGE (A) 02/29/2020 1204   HGBUR negative 10/05/2010 1019   BILIRUBINUR NEGATIVE 02/29/2020 1204   BILIRUBINUR Negative 11/01/2019 0839   KETONESUR 20 (A) 02/29/2020 1204   PROTEINUR 100 (A) 02/29/2020 1204   UROBILINOGEN 0.2 06/17/2013 1110   NITRITE NEGATIVE 02/29/2020 1204  LEUKOCYTESUR MODERATE (A) 02/29/2020 1204    Radiological Exams on Admission: DG Chest Portable 1 View  Result Date: 02/29/2020 CLINICAL DATA:  Fever EXAM: PORTABLE CHEST 1 VIEW COMPARISON:  12/12/2019 chest radiograph. FINDINGS: Stable cardiomediastinal silhouette with normal heart size. No pneumothorax. No pleural effusion. Lungs appear clear, with no acute consolidative airspace disease and no pulmonary edema. IMPRESSION: No active disease. Electronically Signed   By: Delbert Phenix M.D.   On: 02/29/2020 09:40   CT Renal Stone Study  Result Date: 02/29/2020 CLINICAL DATA:  Flank pain. Kidney stones suspected. Bilateral flank pain with nausea and vomiting. EXAM: CT ABDOMEN AND PELVIS WITHOUT CONTRAST TECHNIQUE: Multidetector CT imaging of the abdomen and pelvis was performed following the standard protocol without IV contrast. COMPARISON:  06/01/2019 FINDINGS: Lower chest: No acute abnormality. Hepatobiliary:  Status post cholecystectomy. Air is identified within the biliary tree consistent with prior sphincterotomy. No liver lesions. Pancreas: Unremarkable. No pancreatic ductal dilatation or surrounding inflammatory changes. Spleen: Normal in size without focal abnormality. Adrenals/Urinary Tract: Adrenal glands are normal. There is diffuse stranding surrounding the entire RIGHT kidney. The RIGHT kidney is symmetric in size compared to the LEFT. Size of the RIGHT renal vein is normal. No intrarenal or ureteral stones are identified. There is stranding in the retroperitoneum surrounding the RIGHT ureter to level of the urinary bladder. The LEFT kidney and ureter are unremarkable. The bladder and visualized portion of the urethra are normal. Stomach/Bowel: Stomach is normal in appearance. Small bowel loops are unremarkable. Moderate stool burden within normal appearing loops of colon. Fecal material within the otherwise normal-appearing appendix. Mesentery is unremarkable. Vascular/Lymphatic: No significant vascular findings are present. No enlarged abdominal or pelvic lymph nodes. Reproductive: Suspect LEFT ovarian cyst/follicle measuring 2.7 centimeters. Region of the RIGHT ovary is unremarkable. Uterus is present. Other: Small fat containing paraumbilical hernia.  No ascites. Musculoskeletal: No acute or significant osseous findings. IMPRESSION: 1. Diffuse stranding surrounding the entire RIGHT kidney and RIGHT ureter to level of the urinary bladder. Findings are consistent with pyelonephritis and ureteritis. 2. No intrarenal or ureteral stones. 3. Status post cholecystectomy and sphincterotomy. 4. Small fat containing paraumbilical hernia. 5. Suspect LEFT ovarian cyst/follicle measuring 2.7 centimeters. 6. Moderate stool burden. Electronically Signed   By: Norva Pavlov M.D.   On: 02/29/2020 12:53    EKG: Independently reviewed.  Sinus tachycardia with borderline T wave abnormality and borderline prolonged QT  interval. Assessment/Plan Principal Problem:   Sepsis (HCC) Active Problems:   Type 2 diabetes mellitus (HCC)   HYPERTENSION, BENIGN ESSENTIAL   Pyelonephritis of right kidney   Prolonged QT interval   Hyponatremia   Total bilirubin, elevated    Sepsis secondary to right kidney pyelonephritis & Ureteritis: -Patient presented with fever of 102.9, tachycardia, tachypnea, leukocytosis of 14.7.  UA is positive for leukocyte/WBC/bacteria.  CT renal stone: Shows pyelonephritis and ureteritis.  Lactic acid, Lipase, magnesium: WNL.  COVID-19 is negative. -Patient received IV Rocephin in ED. -Admit patient to stepdown unit for close monitoring.  Continue IV fluids.  Continue IV Rocephin. -Blood culture and urine culture is obtained and is pending.  Check procalcitonin level. -Tylenol as needed for fever more than 100.4.  Oxycodone as needed for pain control.  Zofran as needed for nausea and vomiting. -Monitor vitals closely.  Hypertension: Blood pressure is elevated -Continue lisinopril.  Hydralazine as needed for blood pressure more than 160/100.  Type 2 diabetes mellitus: Hold Metformin and glipizide for now.  Started patient on sliding scale insulin.  Monitor blood sugar closely.  Check A1c.  Foul-smelling vaginal discharge: Ordered STD work-up including HIV.  Hyponatremia: Sodium of 132.  Continue IV fluids.  Repeat CMP tomorrow  Prolonged QT interval: -Unknown underlying etiology -Magnesium IV given in the ED.  Reviewed medications -On telemetry.  Elevated alkaline phosphatase/total bilirubin: -Check PT/INR, acute hepatitis panel -Repeat CMP tomorrow a.m.  Please note: Received page from RN that patient's blood pressure noted to be 89/67.  Will give IVF bolusx2. I asked RN to monitor patient's blood pressure closely and page night physician if no improvement in blood pressure and she verbalized understanding.  DVT prophylaxis: Lovenox/SCD Code Status: Full code Family  Communication: None present at bedside.  Plan of care discussed with patient in length and she verbalized understanding and agreed with it. Disposition Plan: Home in 1 to 2 days Consults called: None Admission status: Inpatient   Ollen Bowl MD [facility]  If 7PM-7AM, please contact night-coverage [email or web address] Password [email or web address]  02/29/2020, 1:19 PM

## 2020-02-29 NOTE — ED Provider Notes (Signed)
[facility] EMERGENCY DEPARTMENT Provider Note   CSN: [id number] Arrival date & time: 02/29/20  0400     History No chief complaint on file.   Rhonda Waller is a 44 y.o. female.  HPI Patient speaks Spanish and an interpreter was used.     Patient presents to emergency department with chief complaint of right lower back pain and dysuria.  Patient explains yesterday she felt like her lower back was hurting her and noticed that it stings when she urinates.  She admits to having a fever, headache, chills and overall not feeling well.  She denies abdominal pain, nausea, vomiting, diarrhea, general malaise.  She denies any recent sick contacts and is not Covid vaccinated.  She states that she has had a UTI in the past and states this feels like he is having another one.  She has no history of kidney stones, pancreatitis, and had her gallbladder removed.  Patient has significant medical history of diabetes and hypertension.  She denies nasal congestion, sore throat, cough, chest pain, shortness of breath, abdominal pain, diarrhea, pedal edema.  Past Medical History:  Diagnosis Date  . Diabetes mellitus without complication (HCC) 2012  . Pregnancy induced hypertension     Patient Active Problem List   Diagnosis Date Noted  . Pyelonephritis of right kidney 02/29/2020  . Sepsis (HCC) 02/29/2020  . Prolonged QT interval 02/29/2020  . Hyponatremia 02/29/2020  . Total bilirubin, elevated 02/29/2020  . GBS (group B streptococcus) UTI complicating pregnancy 06/19/2015  . Abortion, missed 06/17/2015  . Essential hypertension 06/17/2015  . Missed abortion 06/17/2015  . Abnormal liver function tests 05/07/2013  . Obesity 05/06/2013  . KNEE PAIN, BILATERAL 10/05/2010  . OBESITY 04/09/2010  . HYPERTENSION, BENIGN ESSENTIAL 04/09/2010  . Type 2 diabetes mellitus (HCC) 07/25/2009    Past Surgical History:  Procedure Laterality Date  . CHOLECYSTECTOMY       OB  History    Gravida  7   Para  5   Term  5   Preterm  0   AB  1   Living  5     SAB  1   TAB  0   Ectopic  0   Multiple  0   Live Births  5           Family History  Problem Relation Age of Onset  . Diabetes Mother   . Diabetes Maternal Uncle     Social History   Tobacco Use  . Smoking status: Never Smoker  . Smokeless tobacco: Never Used  Substance Use Topics  . Alcohol use: No  . Drug use: No    Home Medications Prior to Admission medications   Medication Sig Start Date End Date Taking? Authorizing Provider  acetaminophen (TYLENOL) 325 MG tablet Take 325 mg by mouth 2 (two) times daily as needed for mild pain or moderate pain.    [provider]  etonogestrel (NEXPLANON) 68 MG IMPL implant 1 each by Subdermal route once.    [provider]  glipiZIDE (GLUCOTROL) 5 MG tablet Take 1 tablet (5 mg total) by mouth 2 (two) times daily before a meal. 11/04/19   Hoy Register, MD  lisinopril (ZESTRIL) 40 MG tablet Take 1 tablet (40 mg total) by mouth daily. 10/31/19   Hoy Register, MD  metFORMIN (GLUCOPHAGE) 500 MG tablet Take 2 tablets (1,000 mg total) by mouth 2 (two) times daily. 11/04/19   Hoy Register, MD  naproxen (NAPROSYN) 500 MG tablet  Take 1 tablet (500 mg total) by mouth 2 (two) times daily. 12/12/19   Dione Booze, MD  nystatin cream (MYCOSTATIN) Apply 1 application topically 2 (two) times daily as needed (rash/irritation).  04/16/19   [provider]  ondansetron (ZOFRAN ODT) 4 MG disintegrating tablet Take 1 tablet (4 mg total) by mouth every 6 (six) hours as needed. Patient taking differently: Take 4 mg by mouth every 6 (six) hours as needed for nausea or vomiting.  06/02/19   Ward, Layla Maw, DO    Allergies    Patient has no known allergies.  Review of Systems   Review of Systems  Constitutional: Positive for chills and fever.  HENT: Negative for congestion, sore throat and trouble swallowing.   Eyes: Negative for  visual disturbance.  Respiratory: Negative for cough and shortness of breath.   Cardiovascular: Negative for chest pain and palpitations.  Gastrointestinal: Negative for abdominal pain, nausea and vomiting.  Genitourinary: Positive for dysuria and flank pain. Negative for enuresis, hematuria, vaginal bleeding and vaginal discharge.  Musculoskeletal: Negative for back pain.  Skin: Negative for rash.  Neurological: Positive for headaches. Negative for dizziness.  Hematological: Does not bruise/bleed easily.    Physical Exam Updated Vital Signs BP 137/88   Pulse (!) 132   Temp (!) 102.9 F (39.4 C) (Oral)   Resp (!) 0   SpO2 96%   Physical Exam Vitals and nursing note reviewed.  Constitutional:      General: She is not in acute distress.    Appearance: She is not ill-appearing.  HENT:     Head: Normocephalic and atraumatic.     Nose: No congestion.     Mouth/Throat:     Mouth: Mucous membranes are moist.     Pharynx: Oropharynx is clear.  Eyes:     General: No scleral icterus. Cardiovascular:     Rate and Rhythm: Normal rate and regular rhythm.     Pulses: Normal pulses.     Heart sounds: No murmur heard.  No friction rub. No gallop.   Pulmonary:     Effort: No respiratory distress.     Breath sounds: No wheezing, rhonchi or rales.  Abdominal:     General: There is no distension.     Tenderness: There is no abdominal tenderness. There is right CVA tenderness. There is no left CVA tenderness, guarding or rebound.  Musculoskeletal:        General: No swelling.  Skin:    General: Skin is warm and dry.     Capillary Refill: Capillary refill takes less than 2 seconds.     Findings: No rash.  Neurological:     Mental Status: She is alert and oriented to person, place, and time.  Psychiatric:        Mood and Affect: Mood normal.     ED Results / Procedures / Treatments   Labs (all labs ordered are listed, but only abnormal results are displayed) Labs Reviewed  CBC -  Abnormal; Notable for the following components:      Result Value   WBC 14.7 (*)    All other components within normal limits  COMPREHENSIVE METABOLIC PANEL - Abnormal; Notable for the following components:   Sodium 132 (*)    CO2 18 (*)    Glucose, Bld 443 (*)    Calcium 8.8 (*)    Albumin 3.3 (*)    AST 52 (*)    Alkaline Phosphatase 166 (*)    Total Bilirubin 1.5 (*)  All other components within normal limits  URINALYSIS, ROUTINE W REFLEX MICROSCOPIC - Abnormal; Notable for the following components:   Color, Urine AMBER (*)    APPearance CLOUDY (*)    Glucose, UA >=500 (*)    Hgb urine dipstick LARGE (*)    Ketones, ur 20 (*)    Protein, ur 100 (*)    Leukocytes,Ua MODERATE (*)    WBC, UA >50 (*)    Bacteria, UA FEW (*)    Non Squamous Epithelial 0-5 (*)    All other components within normal limits  CBC - Abnormal; Notable for the following components:   Hemoglobin 11.6 (*)    All other components within normal limits  CREATININE, SERUM - Abnormal; Notable for the following components:   Creatinine, Ser 1.30 (*)    GFR calc non Af Amer 49 (*)    GFR calc Af Amer 57 (*)    All other components within normal limits  PHOSPHORUS - Abnormal; Notable for the following components:   Phosphorus 1.3 (*)    All other components within normal limits  SARS CORONAVIRUS 2 BY RT PCR (HOSPITAL ORDER, PERFORMED IN [facility])  CULTURE, BLOOD (SINGLE)  URINE CULTURE  WET PREP, GENITAL  CULTURE, BLOOD (ROUTINE X 2)  CULTURE, BLOOD (ROUTINE X 2)  MAGNESIUM  LACTIC ACID, PLASMA  LIPASE, BLOOD  HIV ANTIBODY (ROUTINE TESTING W REFLEX)  HEMOGLOBIN A1C  PROTIME-INR  HEPATITIS PANEL, ACUTE  PROCALCITONIN  I-STAT BETA HCG BLOOD, ED ([facility], [facility], [facility] ONLY)  GC/CHLAMYDIA PROBE AMP ([place]) NOT AT [facility]    EKG EKG Interpretation  Date/Time:  Saturday February 29 2020 13:17:30 EDT Ventricular Rate:  133 PR Interval:  118 QRS Duration: 91 QT Interval:  329 QTC  Calculation: 490 R Axis:   4 Text Interpretation: Sinus tachycardia Borderline T abnormalities, inferior leads Borderline prolonged QT interval QTc improved from prior today Confirmed by Meridee ScoreButler, Michael [phone]([id number]) on 02/29/2020 1:56:27 PM   Radiology DG Chest Portable 1 View  Result Date: 02/29/2020 CLINICAL DATA:  Fever EXAM: PORTABLE CHEST 1 VIEW COMPARISON:  12/12/2019 chest radiograph. FINDINGS: Stable cardiomediastinal silhouette with normal heart size. No pneumothorax. No pleural effusion. Lungs appear clear, with no acute consolidative airspace disease and no pulmonary edema. IMPRESSION: No active disease. Electronically Signed   By: Delbert PhenixJason A Poff M.D.   On: 02/29/2020 09:40   CT Renal Stone Study  Result Date: 02/29/2020 CLINICAL DATA:  Flank pain. Kidney stones suspected. Bilateral flank pain with nausea and vomiting. EXAM: CT ABDOMEN AND PELVIS WITHOUT CONTRAST TECHNIQUE: Multidetector CT imaging of the abdomen and pelvis was performed following the standard protocol without IV contrast. COMPARISON:  06/01/2019 FINDINGS: Lower chest: No acute abnormality. Hepatobiliary: Status post cholecystectomy. Air is identified within the biliary tree consistent with prior sphincterotomy. No liver lesions. Pancreas: Unremarkable. No pancreatic ductal dilatation or surrounding inflammatory changes. Spleen: Normal in size without focal abnormality. Adrenals/Urinary Tract: Adrenal glands are normal. There is diffuse stranding surrounding the entire RIGHT kidney. The RIGHT kidney is symmetric in size compared to the LEFT. Size of the RIGHT renal vein is normal. No intrarenal or ureteral stones are identified. There is stranding in the retroperitoneum surrounding the RIGHT ureter to level of the urinary bladder. The LEFT kidney and ureter are unremarkable. The bladder and visualized portion of the urethra are normal. Stomach/Bowel: Stomach is normal in appearance. Small bowel loops are unremarkable. Moderate stool  burden within normal appearing loops of colon. Fecal material within the  otherwise normal-appearing appendix. Mesentery is unremarkable. Vascular/Lymphatic: No significant vascular findings are present. No enlarged abdominal or pelvic lymph nodes. Reproductive: Suspect LEFT ovarian cyst/follicle measuring 2.7 centimeters. Region of the RIGHT ovary is unremarkable. Uterus is present. Other: Small fat containing paraumbilical hernia.  No ascites. Musculoskeletal: No acute or significant osseous findings. IMPRESSION: 1. Diffuse stranding surrounding the entire RIGHT kidney and RIGHT ureter to level of the urinary bladder. Findings are consistent with pyelonephritis and ureteritis. 2. No intrarenal or ureteral stones. 3. Status post cholecystectomy and sphincterotomy. 4. Small fat containing paraumbilical hernia. 5. Suspect LEFT ovarian cyst/follicle measuring 2.7 centimeters. 6. Moderate stool burden. Electronically Signed   By: Norva Pavlov M.D.   On: 02/29/2020 12:53    Procedures .Critical Care Performed by: Carroll Sage, PA-C Authorized by: Carroll Sage, PA-C   Critical care provider statement:    Critical care time (minutes):  45   Critical care was necessary to treat or prevent imminent or life-threatening deterioration of the following conditions:  Sepsis   Critical care was time spent personally by me on the following activities:  Discussions with consultants, evaluation of patient's response to treatment, examination of patient, ordering and performing treatments and interventions, ordering and review of laboratory studies, ordering and review of radiographic studies, pulse oximetry, re-evaluation of patient's condition, obtaining history from patient or surrogate and review of old charts   I assumed direction of critical care for this patient from another provider in my specialty: yes     (including critical care time)  Medications Ordered in ED Medications  cefTRIAXone  (ROCEPHIN) 1 g in sodium chloride 0.9 % 100 mL IVPB (has no administration in time range)  enoxaparin (LOVENOX) injection 40 mg (has no administration in time range)  acetaminophen (TYLENOL) tablet 650 mg (has no administration in time range)    Or  acetaminophen (TYLENOL) suppository 650 mg (has no administration in time range)  oxyCODONE (Oxy IR/ROXICODONE) immediate release tablet 5 mg (has no administration in time range)  insulin aspart (novoLOG) injection 0-15 Units (has no administration in time range)  insulin aspart (novoLOG) injection 0-5 Units (has no administration in time range)  0.9 %  sodium chloride infusion (has no administration in time range)  hydrALAZINE (APRESOLINE) injection 10 mg (has no administration in time range)  acetaminophen (TYLENOL) tablet 650 mg (650 mg Oral Given 02/29/20 0423)  magnesium sulfate IVPB 2 g 50 mL (0 g Intravenous Stopped 02/29/20 1306)  sodium chloride 0.9 % bolus 1,000 mL (1,000 mLs Intravenous New Bag/Given 02/29/20 1326)  LORazepam (ATIVAN) injection 0.5 mg (0.5 mg Intravenous Given 02/29/20 1218)  acetaminophen (TYLENOL) tablet 500 mg (500 mg Oral Given 02/29/20 1332)    ED Course  I have reviewed the triage vital signs and the nursing notes.  Pertinent labs & imaging results that were available during my care of the patient were reviewed by me and considered in my medical decision making (see chart for details).  Clinical Course as of Feb 28 1418  Sat Feb 29, 2020  [id number] 44 year old female diabetic here with fevers chills body aches nausea and some urinary symptoms.  Covid testing negative.  Tachycardic here uncomfortable appearing.  Urinalysis looks grossly infected and CT consistent with pyelonephritis.  Getting IV fluids IV antibiotics.  Likely need admission for further management as remains tachycardic and febrile.   [MB]    Clinical Course User Index [MB] Terrilee Files, MD   MDM Rules/Calculators/A&P  I have  personally reviewed all imaging, labs and have interpreted them.  On examination patient did not appear to be in any acute distress, vital signs were rechecked, she was afebrile, tachycardic, normotensive, satting 100% room air.  Exam showed positive for right lower back pain, right CVA tenderness flank pain, no pedal edema, no abdominal tenderness noted on exam.  EKG showing prolonged QT will order magnesium and provide IV mag 2 g.  Will obtain urinalysis for UTI and obtain   Patient reassessed complaining of right flank pain and nausea.  Lab work is concerning for pyelonephritis, UA consistent with UTI, CBC showed leukocytosis, patient is tachycardicand complaining of nausea.  Will send for CT scan looking for septic kidney stone, hydronephrosis and provide her with Ativan due to QT prolongation as well as order second EKG due to continuing tachycardia.  CT scan showed diffuse stranding around the entire right kidney ureter up to the level of the urinary bladder consistent with pyelonephritis.  Patient febrile at 102.9, tachycardic, with obvious source of infection meets sepsis criteria will start code sepsis secondary to pyelonephritis.  Will provide fluids, order sepsis labs, provide antibiotics, consult hospitalist for admission.  Unclear etiology of prolonged QT, she does not have history of prolonged QT, she is not on medications that would prolong QT, electrolytes were within normal limits. will continue to monitor patient and withhold QT prolonging medications.  Hyperglycemia most likely secondary to systemic infection.  She does not show signs of DKA or HHS,  no anion gap or lactic acid noted, .  We will continue to hydrate and monitor glucose level.  Spoke with Dr. Jacqulyn Bath states she will come and evaluate the patient.  Will be admitted to the hospital for further evaluation and management of her UTI. patient care will be handed over to hospitalist team for continuing care.    Final Clinical  Impression(s) / ED Diagnoses Final diagnoses:  Pyelonephritis  Right flank pain    Rx / DC Orders ED Discharge Orders    None       Carroll Sage, PA-C 02/29/20 1425    Terrilee Files, MD 02/29/20 [phone]

## 2020-02-29 NOTE — ED Notes (Signed)
Report given to [id number] [id number] 

## 2020-02-29 NOTE — ED Triage Notes (Signed)
Pt here from home with c/o body aches and fever and of feeling well, no covid vaccine

## 2020-02-29 NOTE — Progress Notes (Signed)
Spoke with Dr. Jacqulyn Bath regarding pt SBP 90's, maps <65. Orders obtained see MAR. Pt assessment unchanged.

## 2020-03-01 LAB — COMPREHENSIVE METABOLIC PANEL
ALT: 31 U/L (ref 0–44)
AST: 28 U/L (ref 15–41)
Albumin: 2.3 g/dL — ABNORMAL LOW (ref 3.5–5.0)
Alkaline Phosphatase: 152 U/L — ABNORMAL HIGH (ref 38–126)
Anion gap: 12 (ref 5–15)
BUN: 22 mg/dL — ABNORMAL HIGH (ref 6–20)
CO2: 17 mmol/L — ABNORMAL LOW (ref 22–32)
Calcium: 7.2 mg/dL — ABNORMAL LOW (ref 8.9–10.3)
Chloride: 106 mmol/L (ref 98–111)
Creatinine, Ser: 1.27 mg/dL — ABNORMAL HIGH (ref 0.44–1.00)
GFR calc Af Amer: 59 mL/min — ABNORMAL LOW (ref >60)
GFR calc non Af Amer: 51 mL/min — ABNORMAL LOW (ref >60)
Glucose, Bld: 293 mg/dL — ABNORMAL HIGH (ref 70–99)
Potassium: 3.3 mmol/L — ABNORMAL LOW (ref 3.5–5.1)
Sodium: 135 mmol/L (ref 135–145)
Total Bilirubin: 1.4 mg/dL — ABNORMAL HIGH (ref 0.3–1.2)
Total Protein: 5.3 g/dL — ABNORMAL LOW (ref 6.5–8.1)

## 2020-03-01 LAB — BLOOD CULTURE ID PANEL (REFLEXED) - BCID2

## 2020-03-01 LAB — BASIC METABOLIC PANEL
Anion gap: 12 (ref 5–15)
BUN: 24 mg/dL — ABNORMAL HIGH (ref 6–20)
CO2: 16 mmol/L — ABNORMAL LOW (ref 22–32)
Calcium: 7.2 mg/dL — ABNORMAL LOW (ref 8.9–10.3)
Chloride: 109 mmol/L (ref 98–111)
Creatinine, Ser: 1.23 mg/dL — ABNORMAL HIGH (ref 0.44–1.00)
GFR calc Af Amer: >60 mL/min (ref >60)
GFR calc non Af Amer: 53 mL/min — ABNORMAL LOW (ref >60)
Glucose, Bld: 234 mg/dL — ABNORMAL HIGH (ref 70–99)
Potassium: 3.8 mmol/L (ref 3.5–5.1)
Sodium: 137 mmol/L (ref 135–145)

## 2020-03-01 LAB — GLUCOSE, CAPILLARY
Glucose-Capillary: 258 mg/dL — ABNORMAL HIGH (ref 70–99)
Glucose-Capillary: 243 mg/dL — ABNORMAL HIGH (ref 70–99)
Glucose-Capillary: 218 mg/dL — ABNORMAL HIGH (ref 70–99)
Glucose-Capillary: 217 mg/dL — ABNORMAL HIGH (ref 70–99)
Glucose-Capillary: 226 mg/dL — ABNORMAL HIGH (ref 70–99)

## 2020-03-01 LAB — CBC
HCT: 31.8 % — ABNORMAL LOW (ref 36.0–46.0)
Hemoglobin: 10.6 g/dL — ABNORMAL LOW (ref 12.0–15.0)
MCH: 28.6 pg (ref 26.0–34.0)
MCHC: 33.3 g/dL (ref 30.0–36.0)
MCV: 85.7 fL (ref 80.0–100.0)
Platelets: 155 10*3/uL (ref 150–400)
RBC: 3.71 MIL/uL — ABNORMAL LOW (ref 3.87–5.11)
RDW: 13.6 % (ref 11.5–15.5)
WBC: 24.7 10*3/uL — ABNORMAL HIGH (ref 4.0–10.5)
nRBC: 0 % (ref 0.0–0.2)

## 2020-03-01 LAB — PROCALCITONIN: Procalcitonin: 126.38 ng/mL

## 2020-03-01 LAB — PROTIME-INR
INR: 1.7 — ABNORMAL HIGH (ref 0.8–1.2)
Prothrombin Time: 19.4 seconds — ABNORMAL HIGH (ref 11.4–15.2)

## 2020-03-01 LAB — LACTIC ACID, PLASMA
Lactic Acid, Venous: 1.7 mmol/L (ref 0.5–1.9)
Lactic Acid, Venous: 1.3 mmol/L (ref 0.5–1.9)

## 2020-03-01 MED ORDER — POTASSIUM CHLORIDE CRYS ER 20 MEQ PO TBCR
40.0000 meq | EXTENDED_RELEASE_TABLET | ORAL | Status: AC
  Administered 2020-03-01 (×2): 40 meq via ORAL
  Filled 2020-03-01 (×2): qty 2

## 2020-03-01 MED ORDER — SODIUM CHLORIDE 0.9 % IV SOLN
2.0000 g | INTRAVENOUS | Status: DC
  Administered 2020-03-01 – 2020-03-04 (×4): 2 g via INTRAVENOUS
  Filled 2020-03-01 (×5): qty 20

## 2020-03-01 MED ORDER — INSULIN GLARGINE 100 UNIT/ML ~~LOC~~ SOLN
20.0000 [IU] | Freq: Every day | SUBCUTANEOUS | Status: DC
  Administered 2020-03-01 – 2020-03-02 (×2): 20 [IU] via SUBCUTANEOUS
  Filled 2020-03-01 (×2): qty 0.2

## 2020-03-01 MED ORDER — ACETAMINOPHEN 325 MG PO TABS
325.0000 mg | ORAL_TABLET | Freq: Once | ORAL | Status: AC
  Administered 2020-03-01: 325 mg via ORAL
  Filled 2020-03-01: qty 1

## 2020-03-01 NOTE — Plan of Care (Signed)
  Problem: Clinical Measurements: Goal: Respiratory complications will improve Outcome: Progressing Goal: Cardiovascular complication will be avoided Outcome: Progressing   

## 2020-03-01 NOTE — Progress Notes (Signed)
PROGRESS NOTE    Rhonda Waller  QIO:[id number] DOB: 1975-09-15 DOA: 02/29/2020 PCP: Patient, No Pcp Per   Brief Narrative:  HPI: Rhonda Waller is a 44 y.o. female with medical history significant of hypertension, diabetes, obesity presents to emergency department with fever, chills, right flank pain since 2 days.  Patient tells me that her symptoms started 2 days ago with fever, chills, right flank pain, lower abdominal pain, dysuria and foul-smelling urine associated with nausea, vomiting, generalized weakness, lethargy and decreased appetite.  She had history of UTI in the past, denies history of renal stones, hematuria, headache, blurry vision, chest pain, shortness of breath, palpitation, leg swelling, weight loss, bowel changes.  She is sexually active with her boyfriend reports foul-smelling vaginal discharge since 1 week, denies previous history of STD, LMP: Irregular cycles every 3 months.  She has 2 kids.  No history of smoking, alcohol, licit drug use.  ED Course: Upon arrival to ED: Patient had fever of 102.9, tachycardic, tachypneic, blood pressure elevated, leukocytosis of 14.7, CMP shows sodium of 132, alkaline phosphatase 166, total bilirubin: 1.5, lipase, magnesium, lactic acid: WNL, UA is positive for leukocytes, WBCs, CT renal stone shows right kidney pyelonephritis-no evidence of renal or ureteral stones.  Patient was given a dose of IV Rocephin in ED.  [facility] consulted for admission for sepsis secondary to pyelonephritis.  Assessment & Plan:   Principal Problem:   Sepsis (HCC) Active Problems:   Type 2 diabetes mellitus (HCC)   HYPERTENSION, BENIGN ESSENTIAL   Pyelonephritis of right kidney   Prolonged QT interval   Hyponatremia   Total bilirubin, elevated   Sepsis secondary to right kidney pyelonephritis & Ureteritis/bacteremia, POA: -Patient presented with fever of 102.9, tachycardia, tachypnea, leukocytosis of 14.7.  UA is positive  for leukocyte/WBC/bacteria.  CT renal stone: Shows pyelonephritis and ureteritis.  Lactic acid, Lipase, magnesium: WNL.  COVID-19 is negative. -Patient received IV Rocephin in ED and this was continued after admission.Patient clinically has improved with the last temperature of 100.9 at 3 AM on 03/01/2020.  Interestingly she came in with leukocytosis which resolved and now has worsened leukocytosis.  Blood culture and urine culture both are growing E. coli.  Sensitivities pending.  Continue Rocephin 2 g for now.  Surprisingly, although clinically she has improved but her numbers are going in the wrong direction.  Her procalcitonin jumped from 45 yesterday to 126 today.  Acute kidney injury: Developed acute kidney injury after admission.  Likely secondary to sepsis/dehydration.  Continue IV hydration.  Repeat labs in the morning.  Avoid nephrotoxic agents.  Discontinue lisinopril.  Hypertension: Patient's blood pressure had dropped soon after admission.  She was given some IV fluid bolus.  Currently blood pressure within normal range.  Continue normal saline at 150 cc/h.  Discontinue lisinopril.  Type 2 diabetes mellitus: On Metformin and glipizide at home.  Continue to hold both of them.  Hemoglobin A1c 12.7.  Blood sugar in 200s range.  Start on Lantus 20 units and continue SSI.   Foul-smelling vaginal discharge: No further complaints like this.  HIV negative.  STD panel pending.  Hyponatremia: Resolved  Hypokalemia: Replaced today.  Prolonged QT interval: -Unknown underlying etiology -Magnesium IV given in the ED.  Reviewed medications -On telemetry.  Will repeat EKG tomorrow morning.    Elevated alkaline phosphatase/total bilirubin: Remained stable.   DVT prophylaxis: enoxaparin (LOVENOX) injection 40 mg Start: 02/29/20 1400 SCDs Start: 02/29/20 1313   Code Status: Full Code  Family Communication: None present  at bedside.  Plan of care discussed with patient in length and he  verbalized understanding and agreed with it.  Status is: Inpatient  Remains inpatient appropriate because:Inpatient level of care appropriate due to severity of illness   Dispo: The patient is from: Home              Anticipated d/c is to: Home              Anticipated d/c date is: 2 days              Patient currently is not medically stable to d/c.        Estimated body mass index is 36.96 kg/m as calculated from the following:   Height as of 06/02/19: 4\' 11"  (1.499 m).   Weight as of 12/12/19: 83 kg.      Nutritional status:               Consultants:   None  Procedures:   None  Antimicrobials:  Anti-infectives (From admission, onward)   Start     Dose/Rate Route Frequency Ordered Stop   03/01/20 0400  cefTRIAXone (ROCEPHIN) 2 g in sodium chloride 0.9 % 100 mL IVPB     Discontinue     2 g 200 mL/hr over 30 Minutes Intravenous Every 24 hours 03/01/20 0342     02/29/20 1330  cefTRIAXone (ROCEPHIN) 1 g in sodium chloride 0.9 % 100 mL IVPB  Status:  Discontinued        1 g 200 mL/hr over 30 Minutes Intravenous Every 24 hours 02/29/20 1318 02/29/20 1335   02/29/20 1315  cefTRIAXone (ROCEPHIN) 1 g in sodium chloride 0.9 % 100 mL IVPB  Status:  Discontinued        1 g 200 mL/hr over 30 Minutes Intravenous Every 24 hours 02/29/20 1300 03/01/20 0342         Subjective: Patient seen and examined earlier.  She complained of generalized weakness.  No specific complaint.  Objective: Vitals:   03/01/20 0610 03/01/20 0700 03/01/20 0803 03/01/20 1134  BP: 109/77 104/73 123/72 120/80  Pulse: (!) 107 (!) 102 (!) 102 96  Resp: 20 20 19 18   Temp: 98.5 F (36.9 C)  98.4 F (36.9 C) 98.2 F (36.8 C)  TempSrc: Oral Oral Oral Oral  SpO2: 96% 96% 98% 98%    Intake/Output Summary (Last 24 hours) at 03/01/2020 1304 Last data filed at 02/29/2020 2358 Gross per 24 hour  Intake 3745.83 ml  Output 350 ml  Net 3395.83 ml   There were no vitals filed for this  visit.  Examination:  General exam: Appears calm and comfortable, looks weak and tired Respiratory system: Clear to auscultation. Respiratory effort normal. Cardiovascular system: S1 & S2 heard, RRR. No JVD, murmurs, rubs, gallops or clicks. No pedal edema. Gastrointestinal system: Abdomen is nondistended, soft and nontender. No organomegaly or masses felt. Normal bowel sounds heard.  Right CVA tenderness Central nervous system: Alert and oriented. No focal neurological deficits. Extremities: Symmetric 5 x 5 power. Skin: No rashes, lesions or ulcers Psychiatry: Judgement and insight appear normal. Mood & affect appropriate.    Data Reviewed: I have personally reviewed following labs and imaging studies  CBC: Recent Labs  Lab 02/29/20 0418 02/29/20 1339 03/01/20 0405  WBC 14.7* 8.1 24.7*  HGB 12.6 11.6* 10.6*  HCT 38.3 36.1 31.8*  MCV 85.5 86.4 85.7  PLT 229 166 155   Basic Metabolic Panel: Recent Labs  Lab 02/29/20 0418 02/29/20  1108 02/29/20 1339 02/29/20 1655 03/01/20 0405 03/01/20 1143  NA 132*  --   --   --  135 137  K 4.0  --   --   --  3.3* 3.8  CL 100  --   --   --  106 109  CO2 18*  --   --   --  17* 16*  GLUCOSE 443*  --   --  482* 293* 234*  BUN 11  --   --   --  22* 24*  CREATININE 0.83  --  1.30*  --  1.27* 1.23*  CALCIUM 8.8*  --   --   --  7.2* 7.2*  MG  --  1.8  --   --   --   --   PHOS  --   --  1.3*  --   --   --    GFR: CrCl cannot be calculated (Unknown ideal weight.). Liver Function Tests: Recent Labs  Lab 02/29/20 0418 03/01/20 0405  AST 52* 28  ALT 38 31  ALKPHOS 166* 152*  BILITOT 1.5* 1.4*  PROT 7.0 5.3*  ALBUMIN 3.3* 2.3*   Recent Labs  Lab 02/29/20 0418  LIPASE 21   No results for input(s): AMMONIA in the last 168 hours. Coagulation Profile: Recent Labs  Lab 03/01/20 0405  INR 1.7*   Cardiac Enzymes: No results for input(s): CKTOTAL, CKMB, CKMBINDEX, TROPONINI in the last 168 hours. BNP (last 3 results) No results  for input(s): PROBNP in the last 8760 hours. HbA1C: Recent Labs    02/29/20 1339  HGBA1C 12.7*   CBG: Recent Labs  Lab 02/29/20 2215 02/29/20 2349 03/01/20 0421 03/01/20 0805 03/01/20 1136  GLUCAP 331* 315* 258* 243* 218*   Lipid Profile: No results for input(s): CHOL, HDL, LDLCALC, TRIG, CHOLHDL, LDLDIRECT in the last 72 hours. Thyroid Function Tests: No results for input(s): TSH, T4TOTAL, FREET4, T3FREE, THYROIDAB in the last 72 hours. Anemia Panel: No results for input(s): VITAMINB12, FOLATE, FERRITIN, TIBC, IRON, RETICCTPCT in the last 72 hours. Sepsis Labs: Recent Labs  Lab 02/29/20 1108 02/29/20 1339 03/01/20 0405  PROCALCITON  --  45.48 126.38  LATICACIDVEN 1.9  --   --     Recent Results (from the past 240 hour(s))  SARS Coronavirus 2 by RT PCR (hospital order, performed in [facility] hospital lab) Nasopharyngeal Nasopharyngeal Swab     Status: None   Collection Time: 02/29/20  4:13 AM   Specimen: Nasopharyngeal Swab  Result Value Ref Range Status   SARS Coronavirus 2 NEGATIVE NEGATIVE Final    Comment: (NOTE) SARS-CoV-2 target nucleic acids are NOT DETECTED.  The SARS-CoV-2 RNA is generally detectable in upper and lower respiratory specimens during the acute phase of infection. The lowest concentration of SARS-CoV-2 viral copies this assay can detect is 250 copies / mL. A negative result does not preclude SARS-CoV-2 infection and should not be used as the sole basis for treatment or other patient management decisions.  A negative result may occur with improper specimen collection / handling, submission of specimen other than nasopharyngeal swab, presence of viral mutation(s) within the areas targeted by this assay, and inadequate number of viral copies (<250 copies / mL). A negative result must be combined with clinical observations, patient history, and epidemiological information.  Fact Sheet for Patients:    BoilerBrush.com.cy  Fact Sheet for Healthcare Providers: https://pope.com/  This test is not yet approved or  cleared by the [place] FDA and has been  authorized for detection and/or diagnosis of SARS-CoV-2 by FDA under an Emergency Use Authorization (EUA).  This EUA will remain in effect (meaning this test can be used) for the duration of the COVID-19 declaration under Section 564(b)(1) of the Act, 21 U.S.C. section 360bbb-3(b)(1), unless the authorization is terminated or revoked sooner.  Performed at [facility], [address] [place], [place] [address]   Culture, blood (single)     Status: None (Preliminary result)   Collection Time: 02/29/20  1:39 PM   Specimen: BLOOD RIGHT WRIST  Result Value Ref Range Status   Specimen Description BLOOD RIGHT WRIST  Final   Special Requests   Final    BOTTLES DRAWN AEROBIC AND ANAEROBIC Blood Culture results may not be optimal due to an excessive volume of blood received in culture bottles   Culture  Setup Time   Final    GRAM NEGATIVE RODS IN BOTH AEROBIC AND ANAEROBIC BOTTLES CRITICAL RESULT CALLED TO, READ BACK BY AND VERIFIED WITH: Mercer PodV. BRYK,PHARMD [id number] 03/01/2020 Girtha Hake. TYSOR Performed at [facility], [address] [place], [place] [address]    Culture GRAM NEGATIVE RODS  Final   Report Status PENDING  Incomplete  Blood Culture ID Panel (Reflexed)     Status: Abnormal   Collection Time: 02/29/20  1:39 PM  Result Value Ref Range Status   Enterococcus faecalis NOT DETECTED NOT DETECTED Final   Enterococcus Faecium NOT DETECTED NOT DETECTED Final   Listeria monocytogenes NOT DETECTED NOT DETECTED Final   Staphylococcus species NOT DETECTED NOT DETECTED Final   Staphylococcus aureus (BCID) NOT DETECTED NOT DETECTED Final   Staphylococcus epidermidis NOT DETECTED NOT DETECTED Final   Staphylococcus lugdunensis NOT DETECTED NOT DETECTED Final   Streptococcus species NOT  DETECTED NOT DETECTED Final   Streptococcus agalactiae NOT DETECTED NOT DETECTED Final   Streptococcus pneumoniae NOT DETECTED NOT DETECTED Final   Streptococcus pyogenes NOT DETECTED NOT DETECTED Final   A.calcoaceticus-baumannii NOT DETECTED NOT DETECTED Final   Bacteroides fragilis NOT DETECTED NOT DETECTED Final   Enterobacterales DETECTED (A) NOT DETECTED Final    Comment: Enterobacterales represent a large order of gram negative bacteria, not a single organism. CRITICAL RESULT CALLED TO, READ BACK BY AND VERIFIED WITH: V. BRYK,PHARMD [id number] 03/01/2020 T. TYSOR    Enterobacter cloacae complex NOT DETECTED NOT DETECTED Final   Escherichia coli DETECTED (A) NOT DETECTED Final    Comment: CRITICAL RESULT CALLED TO, READ BACK BY AND VERIFIED WITH: V. BRYK,PHARMD [id number] 03/01/2020 T. TYSOR    Klebsiella aerogenes NOT DETECTED NOT DETECTED Final   Klebsiella oxytoca NOT DETECTED NOT DETECTED Final   Klebsiella pneumoniae NOT DETECTED NOT DETECTED Final   Proteus species NOT DETECTED NOT DETECTED Final   Salmonella species NOT DETECTED NOT DETECTED Final   Serratia marcescens NOT DETECTED NOT DETECTED Final   Haemophilus influenzae NOT DETECTED NOT DETECTED Final   Neisseria meningitidis NOT DETECTED NOT DETECTED Final   Pseudomonas aeruginosa NOT DETECTED NOT DETECTED Final   Stenotrophomonas maltophilia NOT DETECTED NOT DETECTED Final   Candida albicans NOT DETECTED NOT DETECTED Final   Candida auris NOT DETECTED NOT DETECTED Final   Candida glabrata NOT DETECTED NOT DETECTED Final   Candida krusei NOT DETECTED NOT DETECTED Final   Candida parapsilosis NOT DETECTED NOT DETECTED Final   Candida tropicalis NOT DETECTED NOT DETECTED Final   Cryptococcus neoformans/gattii NOT DETECTED NOT DETECTED Final   CTX-M ESBL NOT DETECTED NOT DETECTED Final   Carbapenem resistance  IMP NOT DETECTED NOT DETECTED Final   Carbapenem resistance KPC NOT DETECTED NOT DETECTED Final   Carbapenem resistance  NDM NOT DETECTED NOT DETECTED Final   Carbapenem resist OXA 48 LIKE NOT DETECTED NOT DETECTED Final   Carbapenem resistance VIM NOT DETECTED NOT DETECTED Final    Comment: Performed at [facility], [address], [place], [place] [address]  Urine culture     Status: Abnormal (Preliminary result)   Collection Time: 02/29/20  2:14 PM   Specimen: Urine, Random  Result Value Ref Range Status   Specimen Description URINE, RANDOM  Final   Special Requests NONE  Final   Culture (A)  Final    >=100,000 COLONIES/mL ESCHERICHIA COLI SUSCEPTIBILITIES TO FOLLOW Performed at [facility], [address] [place], [place] [address]    Report Status PENDING  Incomplete  Culture, blood (routine x 2)     Status: Abnormal (Preliminary result)   Collection Time: 02/29/20  4:49 PM   Specimen: BLOOD  Result Value Ref Range Status   Specimen Description BLOOD SITE NOT SPECIFIED  Final   Special Requests (A)  Final    AEROCOCCUS SPECIES Blood Culture results may not be optimal due to an inadequate volume of blood received in culture bottles   Culture   Final    NO GROWTH < 24 HOURS Performed at [facility], [address], [place], [place] [address]    Report Status PENDING  Incomplete  Culture, blood (routine x 2)     Status: None (Preliminary result)   Collection Time: 02/29/20  4:55 PM   Specimen: BLOOD  Result Value Ref Range Status   Specimen Description BLOOD BLOOD LEFT HAND  Final   Special Requests   Final    AEROBIC BOTTLE ONLY Blood Culture results may not be optimal due to an inadequate volume of blood received in culture bottles   Culture   Final    NO GROWTH < 24 HOURS Performed at [facility], [address], [place], [place] [address]    Report Status PENDING  Incomplete  Culture, blood (routine x 2)     Status: None (Preliminary result)   Collection Time: 02/29/20  7:58 PM   Specimen: BLOOD  Result Value Ref Range Status   Specimen Description BLOOD  RIGHT ARM  Final   Special Requests   Final    BOTTLES DRAWN AEROBIC AND ANAEROBIC Blood Culture adequate volume   Culture   Final    NO GROWTH < 24 HOURS Performed at [facility], [address] [place], [place] [address]    Report Status PENDING  Incomplete  Culture, blood (routine x 2)     Status: None (Preliminary result)   Collection Time: 02/29/20  8:03 PM   Specimen: BLOOD RIGHT HAND  Result Value Ref Range Status   Specimen Description BLOOD RIGHT HAND  Final   Special Requests   Final    BOTTLES DRAWN AEROBIC AND ANAEROBIC Blood Culture adequate volume   Culture   Final    NO GROWTH < 24 HOURS Performed at [facility], [address], [place], [place] [address]    Report Status PENDING  Incomplete      Radiology Studies: DG Chest Portable 1 View  Result Date: 02/29/2020 CLINICAL DATA:  Fever EXAM: PORTABLE CHEST 1 VIEW COMPARISON:  12/12/2019 chest radiograph. FINDINGS: Stable cardiomediastinal silhouette with normal heart size. No pneumothorax. No pleural effusion. Lungs  appear clear, with no acute consolidative airspace disease and no pulmonary edema. IMPRESSION: No active disease. Electronically Signed   By: Delbert Phenix M.D.   On: 02/29/2020 09:40   CT Renal Stone Study  Result Date: 02/29/2020 CLINICAL DATA:  Flank pain. Kidney stones suspected. Bilateral flank pain with nausea and vomiting. EXAM: CT ABDOMEN AND PELVIS WITHOUT CONTRAST TECHNIQUE: Multidetector CT imaging of the abdomen and pelvis was performed following the standard protocol without IV contrast. COMPARISON:  06/01/2019 FINDINGS: Lower chest: No acute abnormality. Hepatobiliary: Status post cholecystectomy. Air is identified within the biliary tree consistent with prior sphincterotomy. No liver lesions. Pancreas: Unremarkable. No pancreatic ductal dilatation or surrounding inflammatory changes. Spleen: Normal in size without focal abnormality. Adrenals/Urinary Tract: Adrenal glands are normal.  There is diffuse stranding surrounding the entire RIGHT kidney. The RIGHT kidney is symmetric in size compared to the LEFT. Size of the RIGHT renal vein is normal. No intrarenal or ureteral stones are identified. There is stranding in the retroperitoneum surrounding the RIGHT ureter to level of the urinary bladder. The LEFT kidney and ureter are unremarkable. The bladder and visualized portion of the urethra are normal. Stomach/Bowel: Stomach is normal in appearance. Small bowel loops are unremarkable. Moderate stool burden within normal appearing loops of colon. Fecal material within the otherwise normal-appearing appendix. Mesentery is unremarkable. Vascular/Lymphatic: No significant vascular findings are present. No enlarged abdominal or pelvic lymph nodes. Reproductive: Suspect LEFT ovarian cyst/follicle measuring 2.7 centimeters. Region of the RIGHT ovary is unremarkable. Uterus is present. Other: Small fat containing paraumbilical hernia.  No ascites. Musculoskeletal: No acute or significant osseous findings. IMPRESSION: 1. Diffuse stranding surrounding the entire RIGHT kidney and RIGHT ureter to level of the urinary bladder. Findings are consistent with pyelonephritis and ureteritis. 2. No intrarenal or ureteral stones. 3. Status post cholecystectomy and sphincterotomy. 4. Small fat containing paraumbilical hernia. 5. Suspect LEFT ovarian cyst/follicle measuring 2.7 centimeters. 6. Moderate stool burden. Electronically Signed   By: Norva Pavlov M.D.   On: 02/29/2020 12:53    Scheduled Meds: . enoxaparin (LOVENOX) injection  40 mg Subcutaneous Q24H  . insulin aspart  0-9 Units Subcutaneous Q4H  . insulin glargine  20 Units Subcutaneous Daily  . potassium chloride  40 mEq Oral Q4H   Continuous Infusions: . sodium chloride 150 mL/hr at 03/01/20 0811  . cefTRIAXone (ROCEPHIN)  IV 2 g (03/01/20 0434)     LOS: 1 day   Time spent: 37 minutes   Hughie Closs, MD [facility]  03/01/2020,  1:04 PM   To contact the attending provider between 7A-7P or the covering provider during after hours 7P-7A, please log into the web site [email or web address].

## 2020-03-01 NOTE — Progress Notes (Signed)
PHARMACY - PHYSICIAN COMMUNICATION CRITICAL VALUE ALERT - BLOOD CULTURE IDENTIFICATION (BCID)  Rhonda Waller is an 44 y.o. female who presented to [facility] on 02/29/2020 with a chief complaint of fever, chills, and right flank pain.  Assessment:  Started on Rocephin for pyelonephritis, now w/ 1 of 5 sets (2 of 8 bottles) of blood cx growing E.coli.  Name of physician (or Provider) Contacted: Dr Loney Loh  Current antibiotics: Rocephin  Changes to prescribed antibiotics recommended:  Will increase Rocephin to 2g IV Q24H.  Results for orders placed or performed during the hospital encounter of 02/29/20  Blood Culture ID Panel (Reflexed) (Collected: 02/29/2020  1:39 PM)  Result Value Ref Range   Enterococcus faecalis NOT DETECTED NOT DETECTED   Enterococcus Faecium NOT DETECTED NOT DETECTED   Listeria monocytogenes NOT DETECTED NOT DETECTED   Staphylococcus species NOT DETECTED NOT DETECTED   Staphylococcus aureus (BCID) NOT DETECTED NOT DETECTED   Staphylococcus epidermidis NOT DETECTED NOT DETECTED   Staphylococcus lugdunensis NOT DETECTED NOT DETECTED   Streptococcus species NOT DETECTED NOT DETECTED   Streptococcus agalactiae NOT DETECTED NOT DETECTED   Streptococcus pneumoniae NOT DETECTED NOT DETECTED   Streptococcus pyogenes NOT DETECTED NOT DETECTED   A.calcoaceticus-baumannii NOT DETECTED NOT DETECTED   Bacteroides fragilis NOT DETECTED NOT DETECTED   Enterobacterales DETECTED (A) NOT DETECTED   Enterobacter cloacae complex NOT DETECTED NOT DETECTED   Escherichia coli DETECTED (A) NOT DETECTED   Klebsiella aerogenes NOT DETECTED NOT DETECTED   Klebsiella oxytoca NOT DETECTED NOT DETECTED   Klebsiella pneumoniae NOT DETECTED NOT DETECTED   Proteus species NOT DETECTED NOT DETECTED   Salmonella species NOT DETECTED NOT DETECTED   Serratia marcescens NOT DETECTED NOT DETECTED   Haemophilus influenzae NOT DETECTED NOT DETECTED   Neisseria meningitidis NOT DETECTED NOT  DETECTED   Pseudomonas aeruginosa NOT DETECTED NOT DETECTED   Stenotrophomonas maltophilia NOT DETECTED NOT DETECTED   Candida albicans NOT DETECTED NOT DETECTED   Candida auris NOT DETECTED NOT DETECTED   Candida glabrata NOT DETECTED NOT DETECTED   Candida krusei NOT DETECTED NOT DETECTED   Candida parapsilosis NOT DETECTED NOT DETECTED   Candida tropicalis NOT DETECTED NOT DETECTED   Cryptococcus neoformans/gattii NOT DETECTED NOT DETECTED   CTX-M ESBL NOT DETECTED NOT DETECTED   Carbapenem resistance IMP NOT DETECTED NOT DETECTED   Carbapenem resistance KPC NOT DETECTED NOT DETECTED   Carbapenem resistance NDM NOT DETECTED NOT DETECTED   Carbapenem resist OXA 48 LIKE NOT DETECTED NOT DETECTED   Carbapenem resistance VIM NOT DETECTED NOT DETECTED    Vernard Gambles, PharmD, BCPS  03/01/2020  3:43 AM

## 2020-03-02 LAB — CBC WITH DIFFERENTIAL/PLATELET
Abs Immature Granulocytes: 0 10*3/uL (ref 0.00–0.07)
Basophils Absolute: 0 10*3/uL (ref 0.0–0.1)
Basophils Relative: 0 %
Eosinophils Absolute: 0.2 10*3/uL (ref 0.0–0.5)
Eosinophils Relative: 1 %
HCT: 31.4 % — ABNORMAL LOW (ref 36.0–46.0)
Hemoglobin: 10.2 g/dL — ABNORMAL LOW (ref 12.0–15.0)
Lymphocytes Relative: 4 %
Lymphs Abs: 0.9 10*3/uL (ref 0.7–4.0)
MCH: 28.3 pg (ref 26.0–34.0)
MCHC: 32.5 g/dL (ref 30.0–36.0)
MCV: 87 fL (ref 80.0–100.0)
Monocytes Absolute: 0 10*3/uL — ABNORMAL LOW (ref 0.1–1.0)
Monocytes Relative: 0 %
Neutro Abs: 22.4 10*3/uL — ABNORMAL HIGH (ref 1.7–7.7)
Neutrophils Relative %: 95 %
Platelets: 142 10*3/uL — ABNORMAL LOW (ref 150–400)
RBC: 3.61 MIL/uL — ABNORMAL LOW (ref 3.87–5.11)
RDW: 14.6 % (ref 11.5–15.5)
WBC: 23.6 10*3/uL — ABNORMAL HIGH (ref 4.0–10.5)
nRBC: 0 % (ref 0.0–0.2)
nRBC: 0 /100 WBC

## 2020-03-02 LAB — COMPREHENSIVE METABOLIC PANEL
ALT: 22 U/L (ref 0–44)
AST: 23 U/L (ref 15–41)
Albumin: 2.1 g/dL — ABNORMAL LOW (ref 3.5–5.0)
Alkaline Phosphatase: 155 U/L — ABNORMAL HIGH (ref 38–126)
Anion gap: 7 (ref 5–15)
BUN: 18 mg/dL (ref 6–20)
CO2: 19 mmol/L — ABNORMAL LOW (ref 22–32)
Calcium: 7.3 mg/dL — ABNORMAL LOW (ref 8.9–10.3)
Chloride: 111 mmol/L (ref 98–111)
Creatinine, Ser: 0.94 mg/dL (ref 0.44–1.00)
GFR calc Af Amer: >60 mL/min (ref >60)
GFR calc non Af Amer: >60 mL/min (ref >60)
Glucose, Bld: 186 mg/dL — ABNORMAL HIGH (ref 70–99)
Potassium: 4.2 mmol/L (ref 3.5–5.1)
Sodium: 137 mmol/L (ref 135–145)
Total Bilirubin: 0.6 mg/dL (ref 0.3–1.2)
Total Protein: 5.4 g/dL — ABNORMAL LOW (ref 6.5–8.1)

## 2020-03-02 LAB — GLUCOSE, CAPILLARY
Glucose-Capillary: 172 mg/dL — ABNORMAL HIGH (ref 70–99)
Glucose-Capillary: 188 mg/dL — ABNORMAL HIGH (ref 70–99)
Glucose-Capillary: 200 mg/dL — ABNORMAL HIGH (ref 70–99)
Glucose-Capillary: 227 mg/dL — ABNORMAL HIGH (ref 70–99)
Glucose-Capillary: 227 mg/dL — ABNORMAL HIGH (ref 70–99)
Glucose-Capillary: 233 mg/dL — ABNORMAL HIGH (ref 70–99)

## 2020-03-02 LAB — GC/CHLAMYDIA PROBE AMP (~~LOC~~) NOT AT ARMC
Chlamydia: NEGATIVE
Comment: NEGATIVE
Comment: NORMAL
Neisseria Gonorrhea: NEGATIVE

## 2020-03-02 LAB — PROCALCITONIN: Procalcitonin: 45.59 ng/mL

## 2020-03-02 LAB — URINE CULTURE: Culture: >=100000 — AB

## 2020-03-02 MED ORDER — INSULIN GLARGINE 100 UNIT/ML ~~LOC~~ SOLN
30.0000 [IU] | Freq: Every day | SUBCUTANEOUS | Status: DC
  Administered 2020-03-03 – 2020-03-04 (×2): 30 [IU] via SUBCUTANEOUS
  Filled 2020-03-02 (×2): qty 0.3

## 2020-03-02 MED ORDER — PROMETHAZINE HCL 25 MG/ML IJ SOLN
12.5000 mg | Freq: Four times a day (QID) | INTRAMUSCULAR | Status: DC | PRN
  Administered 2020-03-02: 12.5 mg via INTRAVENOUS
  Filled 2020-03-02: qty 1

## 2020-03-02 MED ORDER — INSULIN STARTER KIT- PEN NEEDLES (SPANISH)
1.0000 | Freq: Once | Status: AC
  Administered 2020-03-02: 1
  Filled 2020-03-02: qty 1

## 2020-03-02 MED ORDER — LIVING WELL WITH DIABETES BOOK - IN SPANISH
Freq: Once | Status: AC
  Filled 2020-03-02: qty 1

## 2020-03-02 NOTE — Progress Notes (Addendum)
Inpatient Diabetes Program Recommendations  AACE/ADA: New Consensus Statement on Inpatient Glycemic Control (2015)  Target Ranges:  Prepandial:   less than 140 mg/dL      Peak postprandial:   less than 180 mg/dL (1-2 hours)      Critically ill patients:  140 - 180 mg/dL   Lab Results  Component Value Date   GLUCAP 188 (H) 03/02/2020   HGBA1C 12.7 (H) 02/29/2020    Review of Glycemic Control  Diabetes history: GDM, DM 2 Outpatient Diabetes medications: Metformin 500 mg bid, Glipizide 5 mg daily Current orders for Inpatient glycemic control:  Lantus 20 units Daily Novolog 0-9 units Q4 hours  Inpatient Diabetes Program Recommendations:    Lantus 20 units started yesterday.   -  For outpatient would recommend Lantus 20 in addition to her Glipizide 5 mg Daily and metformin.  -  While inpatient may consider increasing Lantus to 25 units.  Note pt without insurance is already established with the [facility] and will be able to get her insulin from the pharmacy for $10 or less depending on her income and eligibility.  Will speak with pt today regarding insulin use at home.  Addendum 1:36 pm: Spoke with pt at bedside via tablet interpreter Jed Limerick [phone]). Pt has had DM even before pregnancy. PT has had a televisit appt with [facility] a few months ago. Pt did not receive meter but was prescribed metformin and glipizide, which she took for 1 month and waited for a call to see if she was going to continue the medication (miscommunication and was unclear if she had refills).  Discussed A1c 12.7% and need for insulin at time of d/c. Showed pt how to use insulin pen, check glucose, gave pt [employer] glucose meter. Discussed glucose goals, discussed when to check glucose and when to take the insulin. Discussed hypoglycemia.   Lantus solostar insulin pen order # [id number] Insulin pen needles order # [id number]  Will need appointment made for [facility] post hospital follow up.  Thanks,  Christena Deem RN, MSN,  BC-ADM Inpatient Diabetes Coordinator Team Pager [phone] (8a-5p)

## 2020-03-02 NOTE — Progress Notes (Signed)
PROGRESS NOTE    [name]  YHC:[id number] DOB: 1976-06-11 DOA: 02/29/2020 PCP: Patient, No Pcp Per   Brief Narrative:  HPI: Rhonda Waller is a 44 y.o. female with medical history significant of hypertension, diabetes, obesity presents to emergency department with fever, chills, right flank pain since 2 days.  Patient tells me that her symptoms started 2 days ago with fever, chills, right flank pain, lower abdominal pain, dysuria and foul-smelling urine associated with nausea, vomiting, generalized weakness, lethargy and decreased appetite.  She had history of UTI in the past, denies history of renal stones, hematuria, headache, blurry vision, chest pain, shortness of breath, palpitation, leg swelling, weight loss, bowel changes.  She is sexually active with her boyfriend reports foul-smelling vaginal discharge since 1 week, denies previous history of STD, LMP: Irregular cycles every 3 months.  She has 2 kids.  No history of smoking, alcohol, licit drug use.  ED Course: Upon arrival to ED: Patient had fever of 102.9, tachycardic, tachypneic, blood pressure elevated, leukocytosis of 14.7, CMP shows sodium of 132, alkaline phosphatase 166, total bilirubin: 1.5, lipase, magnesium, lactic acid: WNL, UA is positive for leukocytes, WBCs, CT renal stone shows right kidney pyelonephritis-no evidence of renal or ureteral stones.  Patient was given a dose of IV Rocephin in ED.  [facility] hospitalist consulted for admission for sepsis secondary to pyelonephritis.  Assessment & Plan:   Principal Problem:   Sepsis (HCC) Active Problems:   Type 2 diabetes mellitus (HCC)   HYPERTENSION, BENIGN ESSENTIAL   Pyelonephritis of right kidney   Prolonged QT interval   Hyponatremia   Total bilirubin, elevated   Sepsis secondary to right kidney pyelonephritis & Ureteritis/bacteremia, POA: -Patient presented with fever of 102.9, tachycardia, tachypnea, leukocytosis of 14.7.  UA is positive  for leukocyte/WBC/bacteria.  CT renal stone: Shows pyelonephritis and ureteritis.  Lactic acid, Lipase, magnesium: WNL.  COVID-19 is negative. -Patient received IV Rocephin in ED and this was continued after admission.Patient clinically has improved with the last temperature of 100.9 at 3 AM on 03/01/2020.  Slightly improved leukocytosis.  Blood culture and urine culture both are growing E. coli.  Sensitivities pending.  Continue Rocephin 2 g for now.  Her procalcitonin jumped from 45>126> 45today.  Has some nausea today.  Remains on as needed antiemetics.  Acute kidney injury: Developed acute kidney injury after admission.  Likely secondary to sepsis/dehydration.  Now resolved.  Continue IV fluids as she is still nauseous and not taking much intake.  Hypertension: Patient's blood pressure had dropped soon after admission.  She was given some IV fluid bolus.  Lisinopril was discontinued due to this and AKI.  Now blood pressure has started to go up but is still not that elevated.  Continue to hold lisinopril while she is on as needed hydralazine.  Type 2 diabetes mellitus: On Metformin and glipizide at home.  Continue to hold both of them.  Hemoglobin A1c 12.7.  Blood sugar better than yesterday.  Continue 20 units Lantus tonight, if remains elevated then will increase to 30 units tomorrow and continue SSI.  Foul-smelling vaginal discharge: No further complaints like this.  HIV negative.  STD panel pending.  Hyponatremia: Resolved  Hypokalemia: Replaced today.  Prolonged QT interval: -Unknown underlying etiology -Magnesium IV given in the ED.  Reviewed medications -On telemetry.  EKG shows improved QTC.  Elevated alkaline phosphatase/total bilirubin: Remained stable.   DVT prophylaxis: SCDs Start: 02/29/20 1313   Code Status: Full Code  Family Communication: None present at  bedside.  Plan of care discussed with patient in length and he verbalized understanding and agreed with  it.  Status is: Inpatient  Remains inpatient appropriate because:Inpatient level of care appropriate due to severity of illness   Dispo: The patient is from: Home              Anticipated d/c is to: Home              Anticipated d/c date is: 2 days              Patient currently is not medically stable to d/c.        Estimated body mass index is 36.96 kg/m as calculated from the following:   Height as of 06/02/19: 4\' 11"  (1.499 m).   Weight as of 12/12/19: 83 kg.      Nutritional status:               Consultants:   None  Procedures:   None  Antimicrobials:  Anti-infectives (From admission, onward)   Start     Dose/Rate Route Frequency Ordered Stop   03/01/20 0400  cefTRIAXone (ROCEPHIN) 2 g in sodium chloride 0.9 % 100 mL IVPB     Discontinue     2 g 200 mL/hr over 30 Minutes Intravenous Every 24 hours 03/01/20 0342     02/29/20 1330  cefTRIAXone (ROCEPHIN) 1 g in sodium chloride 0.9 % 100 mL IVPB  Status:  Discontinued        1 g 200 mL/hr over 30 Minutes Intravenous Every 24 hours 02/29/20 1318 02/29/20 1335   02/29/20 1315  cefTRIAXone (ROCEPHIN) 1 g in sodium chloride 0.9 % 100 mL IVPB  Status:  Discontinued        1 g 200 mL/hr over 30 Minutes Intravenous Every 24 hours 02/29/20 1300 03/01/20 0342         Subjective: Patient seen and examined.  Feels better than yesterday but still with right flank pain.  Has remained afebrile.  Some nausea.  Vomited last night.  Objective: Vitals:   03/02/20 0725 03/02/20 0727 03/02/20 0827 03/02/20 1200  BP: (!) 159/101 (!) 164/104 (!) 159/91 (!) 137/97  Pulse: (!) 101  (!) 101 89  Resp: (!) 21  (!) 21 15  Temp: 99.5 F (37.5 C)  98.8 F (37.1 C) 98.3 F (36.8 C)  TempSrc: Oral  Oral Oral  SpO2: 97%  94% 96%    Intake/Output Summary (Last 24 hours) at 03/02/2020 1327 Last data filed at 03/02/2020 1149 Gross per 24 hour  Intake 480 ml  Output 850 ml  Net -370 ml   There were no vitals filed for  this visit.  Examination:  General exam: Appears weak Respiratory system: Clear to auscultation. Respiratory effort normal. Cardiovascular system: S1 & S2 heard, RRR. No JVD, murmurs, rubs, gallops or clicks. No pedal edema. Gastrointestinal system: Abdomen is nondistended, soft and nontender. No organomegaly or masses felt. Normal bowel sounds heard.  Right CVA tenderness, some improvement compared to yesterday Central nervous system: Alert and oriented. No focal neurological deficits. Extremities: Symmetric 5 x 5 power. Skin: No rashes, lesions or ulcers.  Psychiatry: Judgement and insight appear normal. Mood & affect appropriate.   Data Reviewed: I have personally reviewed following labs and imaging studies  CBC: Recent Labs  Lab 02/29/20 0418 02/29/20 1339 03/01/20 0405 03/02/20 0557  WBC 14.7* 8.1 24.7* 23.6*  NEUTROABS  --   --   --  22.4*  HGB 12.6  11.6* 10.6* 10.2*  HCT 38.3 36.1 31.8* 31.4*  MCV 85.5 86.4 85.7 87.0  PLT 229 166 155 142*   Basic Metabolic Panel: Recent Labs  Lab 02/29/20 0418 02/29/20 1108 02/29/20 1339 02/29/20 1655 03/01/20 0405 03/01/20 1143 03/02/20 0557  NA 132*  --   --   --  135 137 137  K 4.0  --   --   --  3.3* 3.8 4.2  CL 100  --   --   --  106 109 111  CO2 18*  --   --   --  17* 16* 19*  GLUCOSE 443*  --   --  482* 293* 234* 186*  BUN 11  --   --   --  22* 24* 18  CREATININE 0.83  --  1.30*  --  1.27* 1.23* 0.94  CALCIUM 8.8*  --   --   --  7.2* 7.2* 7.3*  MG  --  1.8  --   --   --   --   --   PHOS  --   --  1.3*  --   --   --   --    GFR: CrCl cannot be calculated (Unknown ideal weight.). Liver Function Tests: Recent Labs  Lab 02/29/20 0418 03/01/20 0405 03/02/20 0557  AST 52* 28 23  ALT 38 31 22  ALKPHOS 166* 152* 155*  BILITOT 1.5* 1.4* 0.6  PROT 7.0 5.3* 5.4*  ALBUMIN 3.3* 2.3* 2.1*   Recent Labs  Lab 02/29/20 0418  LIPASE 21   No results for input(s): AMMONIA in the last 168 hours. Coagulation  Profile: Recent Labs  Lab 03/01/20 0405  INR 1.7*   Cardiac Enzymes: No results for input(s): CKTOTAL, CKMB, CKMBINDEX, TROPONINI in the last 168 hours. BNP (last 3 results) No results for input(s): PROBNP in the last 8760 hours. HbA1C: Recent Labs    02/29/20 1339  HGBA1C 12.7*   CBG: Recent Labs  Lab 03/01/20 1959 03/02/20 0003 03/02/20 0409 03/02/20 0825 03/02/20 1203  GLUCAP 226* 200* 172* 188* 233*   Lipid Profile: No results for input(s): CHOL, HDL, LDLCALC, TRIG, CHOLHDL, LDLDIRECT in the last 72 hours. Thyroid Function Tests: No results for input(s): TSH, T4TOTAL, FREET4, T3FREE, THYROIDAB in the last 72 hours. Anemia Panel: No results for input(s): VITAMINB12, FOLATE, FERRITIN, TIBC, IRON, RETICCTPCT in the last 72 hours. Sepsis Labs: Recent Labs  Lab 02/29/20 1108 02/29/20 1339 03/01/20 0405 03/01/20 1431 03/01/20 1737 03/02/20 0557  PROCALCITON  --  45.48 126.38  --   --  45.59  LATICACIDVEN 1.9  --   --  1.7 1.3  --     Recent Results (from the past 240 hour(s))  SARS Coronavirus 2 by RT PCR (hospital order, performed in [facility] hospital lab) Nasopharyngeal Nasopharyngeal Swab     Status: None   Collection Time: 02/29/20  4:13 AM   Specimen: Nasopharyngeal Swab  Result Value Ref Range Status   SARS Coronavirus 2 NEGATIVE NEGATIVE Final    Comment: (NOTE) SARS-CoV-2 target nucleic acids are NOT DETECTED.  The SARS-CoV-2 RNA is generally detectable in upper and lower respiratory specimens during the acute phase of infection. The lowest concentration of SARS-CoV-2 viral copies this assay can detect is 250 copies / mL. A negative result does not preclude SARS-CoV-2 infection and should not be used as the sole basis for treatment or other patient management decisions.  A negative result may occur with improper specimen collection / handling, submission  of specimen other than nasopharyngeal swab, presence of viral mutation(s) within the areas  targeted by this assay, and inadequate number of viral copies (<250 copies / mL). A negative result must be combined with clinical observations, patient history, and epidemiological information.  Fact Sheet for Patients:   BoilerBrush.com.cyhttps://www.fda.gov/media/136312/download  Fact Sheet for Healthcare Providers: https://pope.com/https://www.fda.gov/media/136313/download  This test is not yet approved or  cleared by the [place] FDA and has been authorized for detection and/or diagnosis of SARS-CoV-2 by FDA under an Emergency Use Authorization (EUA).  This EUA will remain in effect (meaning this test can be used) for the duration of the COVID-19 declaration under Section 564(b)(1) of the Act, 21 U.S.C. section 360bbb-3(b)(1), unless the authorization is terminated or revoked sooner.  Performed at [facility], [address] [place], [place] [address]   Culture, blood (single)     Status: Abnormal (Preliminary result)   Collection Time: 02/29/20  1:39 PM   Specimen: BLOOD RIGHT WRIST  Result Value Ref Range Status   Specimen Description BLOOD RIGHT WRIST  Final   Special Requests   Final    BOTTLES DRAWN AEROBIC AND ANAEROBIC Blood Culture results may not be optimal due to an excessive volume of blood received in culture bottles   Culture  Setup Time   Final    GRAM NEGATIVE RODS IN BOTH AEROBIC AND ANAEROBIC BOTTLES CRITICAL RESULT CALLED TO, READ BACK BY AND VERIFIED WITH: V. Joan MayansBRYK,PHARMD [id number] 03/01/2020 T. TYSOR    Culture (A)  Final    ESCHERICHIA COLI SUSCEPTIBILITIES TO FOLLOW Performed at [facility], [address] [place], [place] [address]    Report Status PENDING  Incomplete  Blood Culture ID Panel (Reflexed)     Status: Abnormal   Collection Time: 02/29/20  1:39 PM  Result Value Ref Range Status   Enterococcus faecalis NOT DETECTED NOT DETECTED Final   Enterococcus Faecium NOT DETECTED NOT DETECTED Final   Listeria monocytogenes NOT DETECTED NOT DETECTED Final    Staphylococcus species NOT DETECTED NOT DETECTED Final   Staphylococcus aureus (BCID) NOT DETECTED NOT DETECTED Final   Staphylococcus epidermidis NOT DETECTED NOT DETECTED Final   Staphylococcus lugdunensis NOT DETECTED NOT DETECTED Final   Streptococcus species NOT DETECTED NOT DETECTED Final   Streptococcus agalactiae NOT DETECTED NOT DETECTED Final   Streptococcus pneumoniae NOT DETECTED NOT DETECTED Final   Streptococcus pyogenes NOT DETECTED NOT DETECTED Final   A.calcoaceticus-baumannii NOT DETECTED NOT DETECTED Final   Bacteroides fragilis NOT DETECTED NOT DETECTED Final   Enterobacterales DETECTED (A) NOT DETECTED Final    Comment: Enterobacterales represent a large order of gram negative bacteria, not a single organism. CRITICAL RESULT CALLED TO, READ BACK BY AND VERIFIED WITH: V. BRYK,PHARMD [id number] 03/01/2020 T. TYSOR    Enterobacter cloacae complex NOT DETECTED NOT DETECTED Final   Escherichia coli DETECTED (A) NOT DETECTED Final    Comment: CRITICAL RESULT CALLED TO, READ BACK BY AND VERIFIED WITH: V. BRYK,PHARMD [id number] 03/01/2020 T. TYSOR    Klebsiella aerogenes NOT DETECTED NOT DETECTED Final   Klebsiella oxytoca NOT DETECTED NOT DETECTED Final   Klebsiella pneumoniae NOT DETECTED NOT DETECTED Final   Proteus species NOT DETECTED NOT DETECTED Final   Salmonella species NOT DETECTED NOT DETECTED Final   Serratia marcescens NOT DETECTED NOT DETECTED Final   Haemophilus influenzae NOT DETECTED NOT DETECTED Final   Neisseria meningitidis NOT DETECTED NOT DETECTED Final   Pseudomonas aeruginosa NOT DETECTED NOT DETECTED Final   Stenotrophomonas  maltophilia NOT DETECTED NOT DETECTED Final   Candida albicans NOT DETECTED NOT DETECTED Final   Candida auris NOT DETECTED NOT DETECTED Final   Candida glabrata NOT DETECTED NOT DETECTED Final   Candida krusei NOT DETECTED NOT DETECTED Final   Candida parapsilosis NOT DETECTED NOT DETECTED Final   Candida tropicalis NOT DETECTED NOT  DETECTED Final   Cryptococcus neoformans/gattii NOT DETECTED NOT DETECTED Final   CTX-M ESBL NOT DETECTED NOT DETECTED Final   Carbapenem resistance IMP NOT DETECTED NOT DETECTED Final   Carbapenem resistance KPC NOT DETECTED NOT DETECTED Final   Carbapenem resistance NDM NOT DETECTED NOT DETECTED Final   Carbapenem resist OXA 48 LIKE NOT DETECTED NOT DETECTED Final   Carbapenem resistance VIM NOT DETECTED NOT DETECTED Final    Comment: Performed at [facility], [address] [place], [place] [address]  Urine culture     Status: Abnormal   Collection Time: 02/29/20  2:14 PM   Specimen: Urine, Random  Result Value Ref Range Status   Specimen Description URINE, RANDOM  Final   Special Requests   Final    NONE Performed at [facility], [address] [place], [place] [address]    Culture >=100,000 COLONIES/mL ESCHERICHIA COLI (A)  Final   Report Status 03/02/2020 FINAL  Final   Organism ID, Bacteria ESCHERICHIA COLI (A)  Final      Susceptibility   Escherichia coli - MIC*    AMPICILLIN >=32 RESISTANT Resistant     CEFAZOLIN <=4 SENSITIVE Sensitive     CEFTRIAXONE <=0.25 SENSITIVE Sensitive     CIPROFLOXACIN <=0.25 SENSITIVE Sensitive     GENTAMICIN <=1 SENSITIVE Sensitive     IMIPENEM <=0.25 SENSITIVE Sensitive     NITROFURANTOIN <=16 SENSITIVE Sensitive     TRIMETH/SULFA >=320 RESISTANT Resistant     AMPICILLIN/SULBACTAM 16 INTERMEDIATE Intermediate     PIP/TAZO <=4 SENSITIVE Sensitive     * >=100,000 COLONIES/mL ESCHERICHIA COLI  Culture, blood (routine x 2)     Status: Abnormal (Preliminary result)   Collection Time: 02/29/20  4:49 PM   Specimen: BLOOD  Result Value Ref Range Status   Specimen Description BLOOD SITE NOT SPECIFIED  Final   Special Requests (A)  Final    AEROCOCCUS SPECIES Blood Culture results may not be optimal due to an inadequate volume of blood received in culture bottles   Culture   Final    NO GROWTH 2 DAYS Performed at [facility], [address] [place], [place] [address]    Report Status PENDING  Incomplete  Culture, blood (routine x 2)     Status: None (Preliminary result)   Collection Time: 02/29/20  4:55 PM   Specimen: BLOOD  Result Value Ref Range Status   Specimen Description BLOOD BLOOD LEFT HAND  Final   Special Requests   Final    AEROBIC BOTTLE ONLY Blood Culture results may not be optimal due to an inadequate volume of blood received in culture bottles   Culture   Final    NO GROWTH 2 DAYS Performed at [facility], [address] [place], [place] [address]    Report Status PENDING  Incomplete  Culture, blood (routine x 2)     Status: None (Preliminary result)   Collection Time: 02/29/20  7:58 PM   Specimen: BLOOD  Result Value Ref Range Status   Specimen Description BLOOD RIGHT ARM  Final   Special Requests   Final  BOTTLES DRAWN AEROBIC AND ANAEROBIC Blood Culture adequate volume   Culture   Final    NO GROWTH 2 DAYS Performed at [facility], [address] [place], [place] [address]    Report Status PENDING  Incomplete  Culture, blood (routine x 2)     Status: None (Preliminary result)   Collection Time: 02/29/20  8:03 PM   Specimen: BLOOD RIGHT HAND  Result Value Ref Range Status   Specimen Description BLOOD RIGHT HAND  Final   Special Requests   Final    BOTTLES DRAWN AEROBIC AND ANAEROBIC Blood Culture adequate volume   Culture   Final    NO GROWTH 2 DAYS Performed at [facility], [address], [place], [place] [address]    Report Status PENDING  Incomplete      Radiology Studies: No results found.  Scheduled Meds: . insulin aspart  0-9 Units Subcutaneous Q4H  . insulin glargine  20 Units Subcutaneous Daily   Continuous Infusions: . sodium chloride 150 mL/hr at 03/02/20 1226  . cefTRIAXone (ROCEPHIN)  IV 2 g (03/02/20 0426)     LOS: 2 days   Time spent: 30 minutes   Hughie Closs, MD [facility]  03/02/2020, 1:27 PM   To  contact the attending provider between 7A-7P or the covering provider during after hours 7P-7A, please log into the web site [email or web address].

## 2020-03-03 LAB — CBC WITH DIFFERENTIAL/PLATELET
Abs Immature Granulocytes: 0.08 10*3/uL — ABNORMAL HIGH (ref 0.00–0.07)
Basophils Absolute: 0.1 10*3/uL (ref 0.0–0.1)
Basophils Relative: 0 %
Eosinophils Absolute: 0.1 10*3/uL (ref 0.0–0.5)
Eosinophils Relative: 0 %
HCT: 34.6 % — ABNORMAL LOW (ref 36.0–46.0)
Hemoglobin: 11.2 g/dL — ABNORMAL LOW (ref 12.0–15.0)
Immature Granulocytes: 1 %
Lymphocytes Relative: 11 %
Lymphs Abs: 1.6 10*3/uL (ref 0.7–4.0)
MCH: 27.9 pg (ref 26.0–34.0)
MCHC: 32.4 g/dL (ref 30.0–36.0)
MCV: 86.1 fL (ref 80.0–100.0)
Monocytes Absolute: 0.6 10*3/uL (ref 0.1–1.0)
Monocytes Relative: 4 %
Neutro Abs: 12.6 10*3/uL — ABNORMAL HIGH (ref 1.7–7.7)
Neutrophils Relative %: 84 %
Platelets: 139 10*3/uL — ABNORMAL LOW (ref 150–400)
RBC: 4.02 MIL/uL (ref 3.87–5.11)
RDW: 14.8 % (ref 11.5–15.5)
WBC: 15 10*3/uL — ABNORMAL HIGH (ref 4.0–10.5)
nRBC: 0.1 % (ref 0.0–0.2)

## 2020-03-03 LAB — GLUCOSE, CAPILLARY
Glucose-Capillary: 143 mg/dL — ABNORMAL HIGH (ref 70–99)
Glucose-Capillary: 170 mg/dL — ABNORMAL HIGH (ref 70–99)
Glucose-Capillary: 218 mg/dL — ABNORMAL HIGH (ref 70–99)
Glucose-Capillary: 229 mg/dL — ABNORMAL HIGH (ref 70–99)
Glucose-Capillary: 234 mg/dL — ABNORMAL HIGH (ref 70–99)
Glucose-Capillary: 287 mg/dL — ABNORMAL HIGH (ref 70–99)

## 2020-03-03 LAB — COMPREHENSIVE METABOLIC PANEL
ALT: 22 U/L (ref 0–44)
AST: 20 U/L (ref 15–41)
Albumin: 2.3 g/dL — ABNORMAL LOW (ref 3.5–5.0)
Alkaline Phosphatase: 241 U/L — ABNORMAL HIGH (ref 38–126)
Anion gap: 11 (ref 5–15)
BUN: 13 mg/dL (ref 6–20)
CO2: 15 mmol/L — ABNORMAL LOW (ref 22–32)
Calcium: 7.8 mg/dL — ABNORMAL LOW (ref 8.9–10.3)
Chloride: 110 mmol/L (ref 98–111)
Creatinine, Ser: 0.89 mg/dL (ref 0.44–1.00)
GFR calc Af Amer: >60 mL/min (ref >60)
GFR calc non Af Amer: >60 mL/min (ref >60)
Glucose, Bld: 177 mg/dL — ABNORMAL HIGH (ref 70–99)
Potassium: 3.7 mmol/L (ref 3.5–5.1)
Sodium: 136 mmol/L (ref 135–145)
Total Bilirubin: 0.8 mg/dL (ref 0.3–1.2)
Total Protein: 6.2 g/dL — ABNORMAL LOW (ref 6.5–8.1)

## 2020-03-03 LAB — CULTURE, BLOOD (SINGLE)

## 2020-03-03 LAB — MAGNESIUM: Magnesium: 2.1 mg/dL (ref 1.7–2.4)

## 2020-03-03 MED ORDER — LISINOPRIL 40 MG PO TABS
40.0000 mg | ORAL_TABLET | Freq: Every day | ORAL | Status: DC
  Administered 2020-03-03 – 2020-03-04 (×2): 40 mg via ORAL
  Filled 2020-03-03 (×2): qty 1

## 2020-03-03 NOTE — Evaluation (Signed)
Physical Therapy Evaluation Patient Details Name: Rhonda Waller MRN: [id number] DOB: [birth year] Today's Date: 03/03/2020   History of Present Illness  44 y.o. female with medical history significant of hypertension, diabetes, obesity presents to ED on 8/7 with 2 day history of fever, chills, right flank pain. Pt with diagnosis of sepsis secondary to R kidney pyelonephritis and ureteritis/bacteremia.  Clinical Impression   Pt presents with generalized weakness, increased time and effort to perform mobility tasks, impaired gait secondary to back pain, and decreased activity tolerance. Pt to benefit from acute PT to address deficits. Pt ambulated short room distance without AD, presenting with mild unsteadiness and fatigue. Pt is independent with mobility and ADLs at baseline, PT expects pt to progress well with mobility as pain and infection improve. PT to progress mobility as tolerated, and will continue to follow acutely.      Follow Up Recommendations No PT follow up;Supervision for mobility/OOB    Equipment Recommendations  None recommended by PT    Recommendations for Other Services       Precautions / Restrictions Precautions Precautions: None Restrictions Weight Bearing Restrictions: No      Mobility  Bed Mobility Overal bed mobility: Needs Assistance Bed Mobility: Supine to Sit;Sit to Supine     Supine to sit: Supervision;HOB elevated Sit to supine: Supervision;HOB elevated   General bed mobility comments: for safety, increased time and effort. Pt with body-wide trembling during mobility 2* fevers  Transfers Overall transfer level: Needs assistance Equipment used: None Transfers: Sit to/from Stand Sit to Stand: Min guard         General transfer comment: min guard for safety, slow to rise and sit  Ambulation/Gait Ambulation/Gait assistance: Supervision Gait Distance (Feet): 30 Feet Assistive device: None Gait Pattern/deviations: Step-through  pattern;Trunk flexed;Shuffle;Decreased stride length Gait velocity: decr   General Gait Details: supervision for safety, pt with forward flexed posture secondary to back pain but responds to cuing. Pt with short shuffling steps, very limited tolerance today  Stairs            Wheelchair Mobility    Modified Rankin (Stroke Patients Only)       Balance Overall balance assessment: Mild deficits observed, not formally tested                                           Pertinent Vitals/Pain Pain Assessment: Faces Faces Pain Scale: Hurts even more Pain Location: low back Pain Descriptors / Indicators: Aching;Sore Pain Intervention(s): Limited activity within patient's tolerance;Monitored during session;Repositioned    Home Living Family/patient expects to be discharged to:: Private residence Living Arrangements: Children Available Help at Discharge: Family;Available 24 hours/day Type of Home: Apartment Home Access: Level entry     Home Layout: One level Home Equipment: None      Prior Function Level of Independence: Independent         Comments: pt does not work, pt's children aged 75 and 12 can assist as needed (44 yo will be in virtual school)     Hand Dominance   Dominant Hand: Right    Extremity/Trunk Assessment   Upper Extremity Assessment Upper Extremity Assessment: Defer to OT evaluation    Lower Extremity Assessment Lower Extremity Assessment: Generalized weakness    Cervical / Trunk Assessment Cervical / Trunk Assessment: Normal  Communication   Communication: Interpreter utilized;Prefers language other than English (Spanish-speaking; amion ipad services [employer]  [id number])  Cognition Arousal/Alertness: Awake/alert Behavior During Therapy: WFL for tasks assessed/performed Overall Cognitive Status: Within Functional Limits for tasks assessed                                        General Comments General  comments (skin integrity, edema, etc.): Bilateral feet mild swelling, pt states "I feel swollen all over". SpO2 88-90% on RA    Exercises     Assessment/Plan    PT Assessment Patient needs continued PT services  PT Problem List Decreased strength;Decreased mobility;Decreased activity tolerance;Decreased balance;Pain;Cardiopulmonary status limiting activity       PT Treatment Interventions Therapeutic activities;Gait training;Therapeutic exercise;Patient/family education;Balance training;Functional mobility training;Neuromuscular re-education    PT Goals (Current goals can be found in the Care Plan section)  Acute Rehab PT Goals Patient Stated Goal: feel better PT Goal Formulation: With patient Time For Goal Achievement: 03/17/20 Potential to Achieve Goals: Good    Frequency Min 3X/week   Barriers to discharge        Co-evaluation               AM-PAC PT "6 Clicks" Mobility  Outcome Measure Help needed turning from your back to your side while in a flat bed without using bedrails?: None Help needed moving from lying on your back to sitting on the side of a flat bed without using bedrails?: None Help needed moving to and from a bed to a chair (including a wheelchair)?: A Little Help needed standing up from a chair using your arms (e.g., wheelchair or bedside chair)?: A Little Help needed to walk in hospital room?: A Little Help needed climbing 3-5 steps with a railing? : A Little 6 Click Score: 20    End of Session   Activity Tolerance: Patient limited by fatigue;Patient limited by pain Patient left: in bed;with call bell/phone within reach   PT Visit Diagnosis: Other abnormalities of gait and mobility (R26.89);Muscle weakness (generalized) (M62.81)    Time: 9379-0240 PT Time Calculation (min) (ACUTE ONLY): 15 min   Charges:   PT Evaluation $PT Eval Low Complexity: 1 Low         Olivier Frayre E, PT Acute Rehabilitation Services Pager [phone]  Office  [phone]  Carmelia Tiner D Despina Hidden 03/03/2020, 4:35 PM

## 2020-03-03 NOTE — Progress Notes (Signed)
PROGRESS NOTE    Rhonda Waller  ZOX:[id number] DOB: [birth year] DOA: 02/29/2020 PCP: Patient, No Pcp Per   Brief Narrative:  Rhonda Waller is a 44 y.o. female with medical history significant of hypertension, diabetes, obesity presented with fever, chills, right flank pain since 2 days.  She has a history of UTI in the past.  She is sexually active with her boyfriend reports foul-smelling vaginal discharge since 1 week, denies previous history of STD, LMP: Irregular cycles every 3 months.   Upon arrival to ED: Patient had fever of 102.9, tachycardic, tachypneic, blood pressure elevated, leukocytosis of 14.7, UA consistent with UTI and CT renal stone shows right kidney pyelonephritis-no evidence of renal or ureteral stones.  Patient was given a dose of IV Rocephin in ED. admitted under hospital service.  Subsequently grew E. coli in urine as well as blood culture.  Assessment & Plan:   Principal Problem:   Sepsis (HCC) Active Problems:   Type 2 diabetes mellitus (HCC)   HYPERTENSION, BENIGN ESSENTIAL   Pyelonephritis of right kidney   Prolonged QT interval   Hyponatremia   Total bilirubin, elevated   Sepsis secondary to right kidney pyelonephritis & Ureteritis/bacteremia, POA: -Patient presented with fever of 102.9, tachycardia, tachypnea, leukocytosis of 14.7.  UA is positive for leukocyte/WBC/bacteria.  CT renal stone: Shows pyelonephritis and ureteritis.  Lactic acid, Lipase, magnesium: WNL.  COVID-19 is negative. -Patient received IV Rocephin in ED and this was continued after admission.Patient clinically has improved with the last temperature of 100.9 at 3 AM on 03/01/2020.  Slightly improved leukocytosis again today.  Blood culture and urine culture both are growing E. coli.  Sensitive to everything except Bactrim and ampicillin.  Continue Rocephin 2 g for now.  Her procalcitonin jumped from 45>126> 45today.  Feels much better.  No more nausea.  Will advance to soft  diet today.  Consult PT OT.  Acute kidney injury: Developed acute kidney injury after admission.  Likely secondary to sepsis/dehydration.  Now resolved.  Has developed some upper extremity edema.  Will discontinue IV fluids.  Essential hypertension: Patient's blood pressure had dropped soon after admission.  She was given some IV fluid bolus.  Lisinopril was discontinued due to this and AKI.  Now blood pressure has started to go up so we will resume lisinopril and continue as needed hydralazine.  Type 2 diabetes mellitus: On Metformin and glipizide at home.  Continue to hold both of them.  Hemoglobin A1c 12.7.  Blood sugar still slightly elevated.  Increase Lantus to 30 units and continue SSI.  Foul-smelling vaginal discharge: No further complaints like this.  HIV negative.  STD panel pending.  Hyponatremia: Resolved  Hypokalemia: Resolved  Prolonged QT interval: -Unknown underlying etiology -Magnesium IV given in the ED.  Reviewed medications -On telemetry.  EKG shows improved QTC.  Elevated alkaline phosphatase/total bilirubin: Remained stable.   DVT prophylaxis: SCDs Start: 02/29/20 1313   Code Status: Full Code  Family Communication: None present at bedside.  Plan of care discussed with patient in length and he verbalized understanding and agreed with it.  Status is: Inpatient  Remains inpatient appropriate because:Inpatient level of care appropriate due to severity of illness   Dispo: The patient is from: Home              Anticipated d/c is to: Home, consulted PT OT              Anticipated d/c date is: 2 days  Patient currently is not medically stable to d/c.        Estimated body mass index is 36.96 kg/m as calculated from the following:   Height as of 06/02/19: 4\' 11"  (1.499 m).   Weight as of 12/12/19: 83 kg.      Nutritional status:               Consultants:   None  Procedures:   None  Antimicrobials:  Anti-infectives  (From admission, onward)   Start     Dose/Rate Route Frequency Ordered Stop   03/01/20 0400  cefTRIAXone (ROCEPHIN) 2 g in sodium chloride 0.9 % 100 mL IVPB     Discontinue     2 g 200 mL/hr over 30 Minutes Intravenous Every 24 hours 03/01/20 0342     02/29/20 1330  cefTRIAXone (ROCEPHIN) 1 g in sodium chloride 0.9 % 100 mL IVPB  Status:  Discontinued        1 g 200 mL/hr over 30 Minutes Intravenous Every 24 hours 02/29/20 1318 02/29/20 1335   02/29/20 1315  cefTRIAXone (ROCEPHIN) 1 g in sodium chloride 0.9 % 100 mL IVPB  Status:  Discontinued        1 g 200 mL/hr over 30 Minutes Intravenous Every 24 hours 02/29/20 1300 03/01/20 0342         Subjective: Seen and examined.  She states that she feels much better.  Still has very minimal right flank pain.  Last nausea was last night.  Tolerated breakfast with full liquid menu.  No other complaint.  Objective: Vitals:   03/03/20 0400 03/03/20 0800 03/03/20 0825 03/03/20 1200  BP: (!) 143/96 (!) 163/106 (!) 175/103 (!) 170/94  Pulse: (!) 103 93 96 96  Resp: 18 19 (!) 22 (!) 23  Temp: 98.4 F (36.9 C) 98.7 F (37.1 C)  99.3 F (37.4 C)  TempSrc: Oral Oral  Oral  SpO2: 95% 96% 94% 96%    Intake/Output Summary (Last 24 hours) at 03/03/2020 1303 Last data filed at 03/03/2020 0827 Gross per 24 hour  Intake --  Output 2050 ml  Net -2050 ml   There were no vitals filed for this visit.  Examination:  General exam: Appears calm and comfortable but weak Respiratory system: Clear to auscultation. Respiratory effort normal. Cardiovascular system: S1 & S2 heard, RRR. No JVD, murmurs, rubs, gallops or clicks. No pedal edema. Gastrointestinal system: Abdomen is nondistended, soft and nontender. No organomegaly or masses felt. Normal bowel sounds heard.  Very minimal right CVA tenderness Central nervous system: Alert and oriented. No focal neurological deficits. Extremities: Symmetric 5 x 5 power. Skin: No rashes, lesions or ulcers.   Psychiatry: Judgement and insight appear normal. Mood & affect appropriate.   Data Reviewed: I have personally reviewed following labs and imaging studies  CBC: Recent Labs  Lab 02/29/20 0418 02/29/20 1339 03/01/20 0405 03/02/20 0557 03/03/20 0114  WBC 14.7* 8.1 24.7* 23.6* 15.0*  NEUTROABS  --   --   --  22.4* 12.6*  HGB 12.6 11.6* 10.6* 10.2* 11.2*  HCT 38.3 36.1 31.8* 31.4* 34.6*  MCV 85.5 86.4 85.7 87.0 86.1  PLT 229 166 155 142* 139*   Basic Metabolic Panel: Recent Labs  Lab 02/29/20 0418 02/29/20 0418 02/29/20 1108 02/29/20 1339 02/29/20 1655 03/01/20 0405 03/01/20 1143 03/02/20 0557 03/03/20 0114  NA 132*  --   --   --   --  135 137 137 136  K 4.0  --   --   --   --  3.3* 3.8 4.2 3.7  CL 100  --   --   --   --  106 109 111 110  CO2 18*  --   --   --   --  17* 16* 19* 15*  GLUCOSE 443*   < >  --   --  482* 293* 234* 186* 177*  BUN 11  --   --   --   --  22* 24* 18 13  CREATININE 0.83   < >  --  1.30*  --  1.27* 1.23* 0.94 0.89  CALCIUM 8.8*  --   --   --   --  7.2* 7.2* 7.3* 7.8*  MG  --   --  1.8  --   --   --   --   --  2.1  PHOS  --   --   --  1.3*  --   --   --   --   --    < > = values in this interval not displayed.   GFR: CrCl cannot be calculated (Unknown ideal weight.). Liver Function Tests: Recent Labs  Lab 02/29/20 0418 03/01/20 0405 03/02/20 0557 03/03/20 0114  AST 52* ALT 38 ALKPHOS 166* 152* 155* 241*  BILITOT 1.5* 1.4* 0.6 0.8  PROT 7.0 5.3* 5.4* 6.2*  ALBUMIN 3.3* 2.3* 2.1* 2.3*   Recent Labs  Lab 02/29/20 0418  LIPASE 21   No results for input(s): AMMONIA in the last 168 hours. Coagulation Profile: Recent Labs  Lab 03/01/20 0405  INR 1.7*   Cardiac Enzymes: No results for input(s): CKTOTAL, CKMB, CKMBINDEX, TROPONINI in the last 168 hours. BNP (last 3 results) No results for input(s): PROBNP in the last 8760 hours. HbA1C: Recent Labs    02/29/20 1339  HGBA1C 12.7*   CBG: Recent Labs  Lab  03/02/20 2032 03/03/20 0020 03/03/20 0432 03/03/20 0929 03/03/20 1210  GLUCAP 227* 143* 170* 218* 229*   Lipid Profile: No results for input(s): CHOL, HDL, LDLCALC, TRIG, CHOLHDL, LDLDIRECT in the last 72 hours. Thyroid Function Tests: No results for input(s): TSH, T4TOTAL, FREET4, T3FREE, THYROIDAB in the last 72 hours. Anemia Panel: No results for input(s): VITAMINB12, FOLATE, FERRITIN, TIBC, IRON, RETICCTPCT in the last 72 hours. Sepsis Labs: Recent Labs  Lab 02/29/20 1108 02/29/20 1339 03/01/20 0405 03/01/20 1431 03/01/20 1737 03/02/20 0557  PROCALCITON  --  45.48 126.38  --   --  45.59  LATICACIDVEN 1.9  --   --  1.7 1.3  --     Recent Results (from the past 240 hour(s))  SARS Coronavirus 2 by RT PCR (hospital order, performed in [facility] hospital lab) Nasopharyngeal Nasopharyngeal Swab     Status: None   Collection Time: 02/29/20  4:13 AM   Specimen: Nasopharyngeal Swab  Result Value Ref Range Status   SARS Coronavirus 2 NEGATIVE NEGATIVE Final    Comment: (NOTE) SARS-CoV-2 target nucleic acids are NOT DETECTED.  The SARS-CoV-2 RNA is generally detectable in upper and lower respiratory specimens during the acute phase of infection. The lowest concentration of SARS-CoV-2 viral copies this assay can detect is 250 copies / mL. A negative result does not preclude SARS-CoV-2 infection and should not be used as the sole basis for treatment or other patient management decisions.  A negative result may occur with improper specimen collection / handling, submission of specimen other than nasopharyngeal swab, presence of viral mutation(s) within the areas targeted by  this assay, and inadequate number of viral copies (<250 copies / mL). A negative result must be combined with clinical observations, patient history, and epidemiological information.  Fact Sheet for Patients:   BoilerBrush.com.cy  Fact Sheet for Healthcare  Providers: https://pope.com/  This test is not yet approved or  cleared by the [place] FDA and has been authorized for detection and/or diagnosis of SARS-CoV-2 by FDA under an Emergency Use Authorization (EUA).  This EUA will remain in effect (meaning this test can be used) for the duration of the COVID-19 declaration under Section 564(b)(1) of the Act, 21 U.S.C. section 360bbb-3(b)(1), unless the authorization is terminated or revoked sooner.  Performed at [facility], [address] [place], [place] [address]   Culture, blood (single)     Status: Abnormal   Collection Time: 02/29/20  1:39 PM   Specimen: BLOOD RIGHT WRIST  Result Value Ref Range Status   Specimen Description BLOOD RIGHT WRIST  Final   Special Requests   Final    BOTTLES DRAWN AEROBIC AND ANAEROBIC Blood Culture results may not be optimal due to an excessive volume of blood received in culture bottles   Culture  Setup Time   Final    GRAM NEGATIVE RODS IN BOTH AEROBIC AND ANAEROBIC BOTTLES CRITICAL RESULT CALLED TO, READ BACK BY AND VERIFIED WITH: Mercer Pod [id number] 03/01/2020 Girtha Hake Performed at [facility], [address], [place], [place] [address]    Culture ESCHERICHIA COLI (A)  Final   Report Status 03/03/2020 FINAL  Final   Organism ID, Bacteria ESCHERICHIA COLI  Final      Susceptibility   Escherichia coli - MIC*    AMPICILLIN >=32 RESISTANT Resistant     CEFAZOLIN <=4 SENSITIVE Sensitive     CEFEPIME <=0.12 SENSITIVE Sensitive     CEFTAZIDIME <=1 SENSITIVE Sensitive     CEFTRIAXONE <=0.25 SENSITIVE Sensitive     CIPROFLOXACIN <=0.25 SENSITIVE Sensitive     GENTAMICIN <=1 SENSITIVE Sensitive     IMIPENEM <=0.25 SENSITIVE Sensitive     TRIMETH/SULFA >=320 RESISTANT Resistant     AMPICILLIN/SULBACTAM 16 INTERMEDIATE Intermediate     PIP/TAZO <=4 SENSITIVE Sensitive     * ESCHERICHIA COLI  Blood Culture ID Panel (Reflexed)     Status: Abnormal    Collection Time: 02/29/20  1:39 PM  Result Value Ref Range Status   Enterococcus faecalis NOT DETECTED NOT DETECTED Final   Enterococcus Faecium NOT DETECTED NOT DETECTED Final   Listeria monocytogenes NOT DETECTED NOT DETECTED Final   Staphylococcus species NOT DETECTED NOT DETECTED Final   Staphylococcus aureus (BCID) NOT DETECTED NOT DETECTED Final   Staphylococcus epidermidis NOT DETECTED NOT DETECTED Final   Staphylococcus lugdunensis NOT DETECTED NOT DETECTED Final   Streptococcus species NOT DETECTED NOT DETECTED Final   Streptococcus agalactiae NOT DETECTED NOT DETECTED Final   Streptococcus pneumoniae NOT DETECTED NOT DETECTED Final   Streptococcus pyogenes NOT DETECTED NOT DETECTED Final   A.calcoaceticus-baumannii NOT DETECTED NOT DETECTED Final   Bacteroides fragilis NOT DETECTED NOT DETECTED Final   Enterobacterales DETECTED (A) NOT DETECTED Final    Comment: Enterobacterales represent a large order of gram negative bacteria, not a single organism. CRITICAL RESULT CALLED TO, READ BACK BY AND VERIFIED WITH: V. BRYK,PHARMD [id number] 03/01/2020 T. TYSOR    Enterobacter cloacae complex NOT DETECTED NOT DETECTED Final   Escherichia coli DETECTED (A) NOT DETECTED Final    Comment: CRITICAL RESULT CALLED TO, READ BACK BY AND VERIFIED  WITH: Mercer Pod [id number] 03/01/2020 T. TYSOR    Klebsiella aerogenes NOT DETECTED NOT DETECTED Final   Klebsiella oxytoca NOT DETECTED NOT DETECTED Final   Klebsiella pneumoniae NOT DETECTED NOT DETECTED Final   Proteus species NOT DETECTED NOT DETECTED Final   Salmonella species NOT DETECTED NOT DETECTED Final   Serratia marcescens NOT DETECTED NOT DETECTED Final   Haemophilus influenzae NOT DETECTED NOT DETECTED Final   Neisseria meningitidis NOT DETECTED NOT DETECTED Final   Pseudomonas aeruginosa NOT DETECTED NOT DETECTED Final   Stenotrophomonas maltophilia NOT DETECTED NOT DETECTED Final   Candida albicans NOT DETECTED NOT DETECTED Final    Candida auris NOT DETECTED NOT DETECTED Final   Candida glabrata NOT DETECTED NOT DETECTED Final   Candida krusei NOT DETECTED NOT DETECTED Final   Candida parapsilosis NOT DETECTED NOT DETECTED Final   Candida tropicalis NOT DETECTED NOT DETECTED Final   Cryptococcus neoformans/gattii NOT DETECTED NOT DETECTED Final   CTX-M ESBL NOT DETECTED NOT DETECTED Final   Carbapenem resistance IMP NOT DETECTED NOT DETECTED Final   Carbapenem resistance KPC NOT DETECTED NOT DETECTED Final   Carbapenem resistance NDM NOT DETECTED NOT DETECTED Final   Carbapenem resist OXA 48 LIKE NOT DETECTED NOT DETECTED Final   Carbapenem resistance VIM NOT DETECTED NOT DETECTED Final    Comment: Performed at [facility], [address], [place], [place] [address]  Urine culture     Status: Abnormal   Collection Time: 02/29/20  2:14 PM   Specimen: Urine, Random  Result Value Ref Range Status   Specimen Description URINE, RANDOM  Final   Special Requests   Final    NONE Performed at [facility], [address], [place], [place] [address]    Culture >=100,000 COLONIES/mL ESCHERICHIA COLI (A)  Final   Report Status 03/02/2020 FINAL  Final   Organism ID, Bacteria ESCHERICHIA COLI (A)  Final      Susceptibility   Escherichia coli - MIC*    AMPICILLIN >=32 RESISTANT Resistant     CEFAZOLIN <=4 SENSITIVE Sensitive     CEFTRIAXONE <=0.25 SENSITIVE Sensitive     CIPROFLOXACIN <=0.25 SENSITIVE Sensitive     GENTAMICIN <=1 SENSITIVE Sensitive     IMIPENEM <=0.25 SENSITIVE Sensitive     NITROFURANTOIN <=16 SENSITIVE Sensitive     TRIMETH/SULFA >=320 RESISTANT Resistant     AMPICILLIN/SULBACTAM 16 INTERMEDIATE Intermediate     PIP/TAZO <=4 SENSITIVE Sensitive     * >=100,000 COLONIES/mL ESCHERICHIA COLI  Culture, blood (routine x 2)     Status: Abnormal (Preliminary result)   Collection Time: 02/29/20  4:49 PM   Specimen: BLOOD  Result Value Ref Range Status   Specimen Description BLOOD SITE NOT  SPECIFIED  Final   Special Requests (A)  Final    AEROCOCCUS SPECIES Blood Culture results may not be optimal due to an inadequate volume of blood received in culture bottles   Culture   Final    NO GROWTH 3 DAYS Performed at [facility], [address] [place], [place] [address]    Report Status PENDING  Incomplete  Culture, blood (routine x 2)     Status: None (Preliminary result)   Collection Time: 02/29/20  4:55 PM   Specimen: BLOOD  Result Value Ref Range Status   Specimen Description BLOOD BLOOD LEFT HAND  Final   Special Requests   Final    AEROBIC BOTTLE ONLY Blood Culture results may not be optimal due to  an inadequate volume of blood received in culture bottles   Culture   Final    NO GROWTH 3 DAYS Performed at [facility], [address], [place], [place] [address]    Report Status PENDING  Incomplete  Culture, blood (routine x 2)     Status: None (Preliminary result)   Collection Time: 02/29/20  7:58 PM   Specimen: BLOOD  Result Value Ref Range Status   Specimen Description BLOOD RIGHT ARM  Final   Special Requests   Final    BOTTLES DRAWN AEROBIC AND ANAEROBIC Blood Culture adequate volume   Culture   Final    NO GROWTH 3 DAYS Performed at [facility], [address] [place], [place] [address]    Report Status PENDING  Incomplete  Culture, blood (routine x 2)     Status: None (Preliminary result)   Collection Time: 02/29/20  8:03 PM   Specimen: BLOOD RIGHT HAND  Result Value Ref Range Status   Specimen Description BLOOD RIGHT HAND  Final   Special Requests   Final    BOTTLES DRAWN AEROBIC AND ANAEROBIC Blood Culture adequate volume   Culture   Final    NO GROWTH 3 DAYS Performed at [facility], [address], [place], [place] [address]    Report Status PENDING  Incomplete      Radiology Studies: No results found.  Scheduled Meds: . insulin aspart  0-9 Units Subcutaneous Q4H  . insulin glargine  30 Units Subcutaneous Daily   . lisinopril  40 mg Oral Daily   Continuous Infusions: . sodium chloride 150 mL/hr at 03/03/20 0934  . cefTRIAXone (ROCEPHIN)  IV 2 g (03/03/20 0433)     LOS: 3 days   Time spent: 29 minutes   Hughie Closs, MD [facility]  03/03/2020, 1:03 PM   To contact the attending provider between 7A-7P or the covering provider during after hours 7P-7A, please log into the web site [email or web address].

## 2020-03-04 LAB — CBC WITH DIFFERENTIAL/PLATELET
Abs Immature Granulocytes: 0.13 10*3/uL — ABNORMAL HIGH (ref 0.00–0.07)
Basophils Absolute: 0 10*3/uL (ref 0.0–0.1)
Basophils Relative: 1 %
Eosinophils Absolute: 0.1 10*3/uL (ref 0.0–0.5)
Eosinophils Relative: 1 %
HCT: 31 % — ABNORMAL LOW (ref 36.0–46.0)
Hemoglobin: 10.3 g/dL — ABNORMAL LOW (ref 12.0–15.0)
Immature Granulocytes: 2 %
Lymphocytes Relative: 18 %
Lymphs Abs: 1.3 10*3/uL (ref 0.7–4.0)
MCH: 28.5 pg (ref 26.0–34.0)
MCHC: 33.2 g/dL (ref 30.0–36.0)
MCV: 85.9 fL (ref 80.0–100.0)
Monocytes Absolute: 0.8 10*3/uL (ref 0.1–1.0)
Monocytes Relative: 11 %
Neutro Abs: 4.7 10*3/uL (ref 1.7–7.7)
Neutrophils Relative %: 67 %
Platelets: 148 10*3/uL — ABNORMAL LOW (ref 150–400)
RBC: 3.61 MIL/uL — ABNORMAL LOW (ref 3.87–5.11)
RDW: 14.7 % (ref 11.5–15.5)
WBC: 7 10*3/uL (ref 4.0–10.5)
nRBC: 0.3 % — ABNORMAL HIGH (ref 0.0–0.2)

## 2020-03-04 LAB — COMPREHENSIVE METABOLIC PANEL
ALT: 21 U/L (ref 0–44)
AST: 27 U/L (ref 15–41)
Albumin: 2 g/dL — ABNORMAL LOW (ref 3.5–5.0)
Alkaline Phosphatase: 237 U/L — ABNORMAL HIGH (ref 38–126)
Anion gap: 9 (ref 5–15)
BUN: 9 mg/dL (ref 6–20)
CO2: 18 mmol/L — ABNORMAL LOW (ref 22–32)
Calcium: 7.8 mg/dL — ABNORMAL LOW (ref 8.9–10.3)
Chloride: 108 mmol/L (ref 98–111)
Creatinine, Ser: 0.75 mg/dL (ref 0.44–1.00)
GFR calc Af Amer: >60 mL/min (ref >60)
GFR calc non Af Amer: >60 mL/min (ref >60)
Glucose, Bld: 207 mg/dL — ABNORMAL HIGH (ref 70–99)
Potassium: 3.7 mmol/L (ref 3.5–5.1)
Sodium: 135 mmol/L (ref 135–145)
Total Bilirubin: 1 mg/dL (ref 0.3–1.2)
Total Protein: 5.3 g/dL — ABNORMAL LOW (ref 6.5–8.1)

## 2020-03-04 LAB — GLUCOSE, CAPILLARY
Glucose-Capillary: 151 mg/dL — ABNORMAL HIGH (ref 70–99)
Glucose-Capillary: 184 mg/dL — ABNORMAL HIGH (ref 70–99)
Glucose-Capillary: 193 mg/dL — ABNORMAL HIGH (ref 70–99)
Glucose-Capillary: 233 mg/dL — ABNORMAL HIGH (ref 70–99)

## 2020-03-04 MED ORDER — LISINOPRIL 40 MG PO TABS: 40.0000 mg | ORAL_TABLET | Freq: Every day | ORAL | 3 refills | Status: DC

## 2020-03-04 MED ORDER — INSULIN GLARGINE 100 UNIT/ML ~~LOC~~ SOLN: 20.0000 [IU] | Freq: Every day | SUBCUTANEOUS | 11 refills | Status: AC

## 2020-03-04 MED ORDER — AMLODIPINE BESYLATE 10 MG PO TABS
10.0000 mg | ORAL_TABLET | Freq: Every day | ORAL | Status: DC
  Administered 2020-03-04: 10 mg via ORAL
  Filled 2020-03-04: qty 1

## 2020-03-04 MED ORDER — LABETALOL HCL 5 MG/ML IV SOLN: 5.0000 mg | INTRAVENOUS | Status: DC | PRN

## 2020-03-04 MED ORDER — AMLODIPINE BESYLATE 10 MG PO TABS: 10.0000 mg | ORAL_TABLET | Freq: Every day | ORAL | 2 refills | Status: DC

## 2020-03-04 MED ORDER — CIPROFLOXACIN HCL 500 MG PO TABS
500.0000 mg | ORAL_TABLET | Freq: Two times a day (BID) | ORAL | 0 refills | Status: AC
Start: 2020-03-04 — End: 2020-03-10

## 2020-03-04 NOTE — Evaluation (Signed)
Occupational Therapy Evaluation Patient Details Name: Rhonda Waller MRN: [id number] DOB: 01-05-1976 Today's Date: 03/04/2020    History of Present Illness 44 y.o. female with medical history significant of hypertension, diabetes, obesity presents to ED on 8/7 with 2 day history of fever, chills, right flank pain. Pt with diagnosis of sepsis secondary to R kidney pyelonephritis and ureteritis/bacteremia.   Clinical Impression   This 44 y/o female presents with the above. PTA pt independent with ADL and functional mobility. Today pt performing mobility tasks using RW at supervision level, completing ADL at minguard to supervision level throughout. Pt with some limitations due to reports of headache and dizziness when standing. BP 158/89 after return to sitting in recliner. She reports dizziness with transitions and with head turns. Visual assessment completed with no nystagmus noted; further educated pt in safety with transitions during mobility tasks - giving time to rest with initial sitting upright and standing as pt was quick to move during this session. Pt to benefit from continued acute OT services to progress her towards her PLOF; anticipate pt to be able to return home with no additional OT follow up after discharge.     Follow Up Recommendations  No OT follow up;Supervision - Intermittent    Equipment Recommendations  None recommended by OT           Precautions / Restrictions Precautions Precautions: None Restrictions Weight Bearing Restrictions: No      Mobility Bed Mobility Overal bed mobility: Modified Independent             General bed mobility comments: quick to move  Transfers Overall transfer level: Needs assistance Equipment used: Rolling walker (2 wheeled) Transfers: Sit to/from Stand Sit to Stand: Supervision         General transfer comment: for balance and safety, pt quick to stand.     Balance Overall balance assessment: Mild deficits  observed, not formally tested                                         ADL either performed or assessed with clinical judgement   ADL Overall ADL's : Needs assistance/impaired Eating/Feeding: Modified independent;Sitting   Grooming: Oral care;Supervision/safety;Standing   Upper Body Bathing: Modified independent;Sitting   Lower Body Bathing: Min guard;Sit to/from stand;Sitting/lateral leans   Upper Body Dressing : Modified independent;Sitting   Lower Body Dressing: Min guard;Sitting/lateral leans;Sit to/from stand   Toilet Transfer: Supervision/safety;Ambulation;RW Toilet Transfer Details (indicate cue type and reason): simulated via transfer to recliner, room level mobility  Toileting- Clothing Manipulation and Hygiene: Min guard;Sitting/lateral lean;Sit to/from stand       Functional mobility during ADLs: Supervision/safety;Rolling walker       Vision Baseline Vision/History: Wears glasses Wears Glasses: Reading only Patient Visual Report: Other (comment) ("dizziness") Vision Assessment?: Yes Eye Alignment: Within Functional Limits Ocular Range of Motion: Within Functional Limits Alignment/Gaze Preference: Within Defined Limits Tracking/Visual Pursuits: Able to track stimulus in all quads without difficulty Additional Comments: pt reports dizziness when turning her head, no nystagmus noted with visual testing     Perception     Praxis      Pertinent Vitals/Pain Pain Assessment: Faces Faces Pain Scale: Hurts little more Pain Location: headache Pain Descriptors / Indicators: Headache Pain Intervention(s): Monitored during session;RN gave pain meds during session;Patient requesting pain meds-RN notified     Hand Dominance Right   Extremity/Trunk Assessment Upper Extremity  Assessment Upper Extremity Assessment: RUE deficits/detail;LUE deficits/detail RUE Deficits / Details: edema noted bil UEs LUE Deficits / Details: edema noted bil UEs    Lower Extremity Assessment Lower Extremity Assessment: Defer to PT evaluation   Cervical / Trunk Assessment Cervical / Trunk Assessment: Normal   Communication Communication Communication: Interpreter utilized;Prefers language other than English (Spanish-speaking; [employer] ip[employer] services Rhonda Waller [phone])   Cognition Arousal/Alertness: Awake/alert Behavior During Therapy: WFL for tasks assessed/performed Overall Cognitive Status: Within Functional Limits for tasks assessed                                     General Comments       Exercises Exercises: General Upper Extremity;Other exercises General Exercises - Upper Extremity Digit Composite Flexion: AROM;Both;10 reps Composite Extension: AROM;10 reps;Both Other Exercises Other Exercises: issued squeeze ball and educated in use for edema management  Other Exercises: encouraged ankle pumps for edema management    Shoulder Instructions      Home Living Family/patient expects to be discharged to:: Private residence Living Arrangements: Children Available Help at Discharge: Family;Available 24 hours/day Type of Home: Apartment Home Access: Level entry     Home Layout: One level     Bathroom Shower/Tub: Chief Strategy Officer: Standard     Home Equipment: None          Prior Functioning/Environment Level of Independence: Independent        Comments: pt does not work, pt's children aged 46 and 53 can assist as needed (44 yo will be in virtual school)        OT Problem List: Decreased activity tolerance;Decreased strength;Impaired balance (sitting and/or standing);Decreased knowledge of use of DME or AE;Obesity;Pain;Cardiopulmonary status limiting activity      OT Treatment/Interventions: Self-care/ADL training;Therapeutic exercise;Energy conservation;DME and/or AE instruction;Therapeutic activities;Patient/family education;Balance training    OT Goals(Current goals can be found in the  care plan section) Acute Rehab OT Goals Patient Stated Goal: feel better OT Goal Formulation: With patient Time For Goal Achievement: 03/18/20 Potential to Achieve Goals: Good  OT Frequency: Min 2X/week   Barriers to D/C:            Co-evaluation              AM-PAC OT "6 Clicks" Daily Activity     Outcome Measure Help from another person eating meals?: None Help from another person taking care of personal grooming?: A Little Help from another person toileting, which includes using toliet, bedpan, or urinal?: A Little Help from another person bathing (including washing, rinsing, drying)?: A Little Help from another person to put on and taking off regular upper body clothing?: None Help from another person to put on and taking off regular lower body clothing?: A Little 6 Click Score: 20   End of Session Equipment Utilized During Treatment: Rolling walker Nurse Communication: Mobility status  Activity Tolerance: Patient tolerated treatment well Patient left: in chair;with call bell/phone within reach  OT Visit Diagnosis: Other abnormalities of gait and mobility (R26.89);Dizziness and giddiness (R42)                Time: 8938-1017 OT Time Calculation (min): 26 min Charges:  OT General Charges $OT Visit: 1 Visit OT Evaluation $OT Eval Moderate Complexity: 1 Mod OT Treatments $Self Care/Home Management : 8-22 mins  Marcy Siren, OT Acute Rehabilitation Services Pager [phone] Office [phone]   Orlando Penner 03/04/2020,  4:44 PM

## 2020-03-04 NOTE — Progress Notes (Signed)
Discharge instructions given to patient. IV removed, clean and intact. Medications reviewed via interpreter. All questions answered. Pt escorted home with boyfriend.  Hazle Nordmann, RN

## 2020-03-05 LAB — CULTURE, BLOOD (ROUTINE X 2)
Culture: NO GROWTH
Culture: NO GROWTH
Culture: NO GROWTH
Culture: NO GROWTH
Special Requests: ADEQUATE
Special Requests: ADEQUATE

## 2020-03-05 NOTE — Discharge Summary (Signed)
Physician Discharge Summary  Rhonda Waller JOA:[id number] DOB: 1976-02-18 DOA: 02/29/2020  PCP: Patient, No Pcp Per  Admit date: 02/29/2020 Discharge date: 03/04/2020  Admitted From: Home.  Disposition: home.   Recommendations for Outpatient Follow-up:  1. Follow up with PCP in 1-2 weeks 2. Please obtain BMP/CBC in one week Please follow up with endocrinology in one week.   Discharge Condition:stable.  CODE STATUS:full code.  Diet recommendation: Heart Healthy   Brief/Interim Summary: Rhonda Ixquier-Ramiresis a 44 y.o.femalewith medical history significant ofhypertension, diabetes, obesity presented with fever, chills, right flank pain since 2 days. Upon arrival to ED: Patient had fever of 102.9, tachycardic, tachypneic, blood pressure elevated, leukocytosis of 14.7, UA consistent with UTI and CT renal stone shows right kidney pyelonephritis-no evidence of renal or ureteral stones. Patient was given a dose of IV Rocephin in ED. admitted under hospital service.  Subsequently grew E. coli in urine as well as blood culture.   Discharge Diagnoses:  Principal Problem:   Sepsis (HCC) Active Problems:   Type 2 diabetes mellitus (HCC)   HYPERTENSION, BENIGN ESSENTIAL   Pyelonephritis of right kidney   Prolonged QT interval   Hyponatremia   Total bilirubin, elevated   Sepsis secondary to right kidney pyelonephritis & Ureteritis/bacteremia, POA: -Patient presented with fever of 102.9, tachycardia, tachypnea, leukocytosis of 14.7.  UA is positive for leukocyte/WBC/bacteria.  CT renal stone: Shows pyelonephritis and ureteritis.  Lactic acid, Lipase, magnesium: WNL.  COVID-19 is negative. -Patient received IV Rocephin in ED and this was continued after admission.Patient clinically has improved . - transitioned to oral antibiotics on discharge to complete a 10 day course.  - repeat Blood cultures neg so far.   Acute kidney injury: Developed acute kidney injury after admission.   Likely secondary to sepsis/dehydration.  Now resolved.    Essential hypertension:  Resume lisinopril and norvasc added with improvement in BP.   Type 2 diabetes mellitus: On Metformin and glipizide at home. Started the patient on lantus 20 units daily and A1c is 12.7  Foul-smelling vaginal discharge: No further complaints like this.  HIV negative.  STD panel pending. Recommended to follow up the results as outpatient.   Hyponatremia: Resolved  Hypokalemia: Resolved  Prolonged QT interval: -Unknown underlying etiology -Magnesium IV given in the ED.  Reviewed medications -On telemetry.  EKG shows improved QTC.  Elevated alkaline phosphatase/total bilirubin: Remained stable.    Discharge Instructions  Discharge Instructions    Diet - low sodium heart healthy   Complete by: As directed    Discharge instructions   Complete by: As directed    Please follow up with PCp in one week.     Allergies as of 03/04/2020   No Known Allergies     Medication List    STOP taking these medications   ibuprofen 200 MG tablet Commonly known as: ADVIL     TAKE these medications   amLODipine 10 MG tablet Commonly known as: NORVASC Take 1 tablet (10 mg total) by mouth daily.   aspirin EC 325 MG tablet Take 325 mg by mouth daily as needed (pain/headache).   ciprofloxacin 500 MG tablet Commonly known as: Cipro Take 1 tablet (500 mg total) by mouth 2 (two) times daily for 6 days.   glipiZIDE 5 MG tablet Commonly known as: GLUCOTROL Take 1 tablet (5 mg total) by mouth 2 (two) times daily before a meal.   insulin glargine 100 UNIT/ML injection Commonly known as: LANTUS Inject 0.2 mLs (20 Units total) into the skin  daily.   lisinopril 40 MG tablet Commonly known as: ZESTRIL Take 1 tablet (40 mg total) by mouth daily. What changed: when to take this   metFORMIN 500 MG tablet Commonly known as: GLUCOPHAGE Take 2 tablets (1,000 mg total) by mouth 2 (two) times daily. What  changed: how much to take   Nexplanon 68 MG Impl implant Generic drug: etonogestrel 68 mg by Subdermal route once.       Follow-up Information    [facility]. Go on 04/06/2020.   Why: f/u appointment first available- 9:30 am- with Dr. Valarie Merino information: [address] [place] [address] [phone]             No Known Allergies  Consultations:  None.    Procedures/Studies: DG Chest Portable 1 View  Result Date: 02/29/2020 CLINICAL DATA:  Fever EXAM: PORTABLE CHEST 1 VIEW COMPARISON:  12/12/2019 chest radiograph. FINDINGS: Stable cardiomediastinal silhouette with normal heart size. No pneumothorax. No pleural effusion. Lungs appear clear, with no acute consolidative airspace disease and no pulmonary edema. IMPRESSION: No active disease. Electronically Signed   By: Delbert Phenix M.D.   On: 02/29/2020 09:40   CT Renal Stone Study  Result Date: 02/29/2020 CLINICAL DATA:  Flank pain. Kidney stones suspected. Bilateral flank pain with nausea and vomiting. EXAM: CT ABDOMEN AND PELVIS WITHOUT CONTRAST TECHNIQUE: Multidetector CT imaging of the abdomen and pelvis was performed following the standard protocol without IV contrast. COMPARISON:  06/01/2019 FINDINGS: Lower chest: No acute abnormality. Hepatobiliary: Status post cholecystectomy. Air is identified within the biliary tree consistent with prior sphincterotomy. No liver lesions. Pancreas: Unremarkable. No pancreatic ductal dilatation or surrounding inflammatory changes. Spleen: Normal in size without focal abnormality. Adrenals/Urinary Tract: Adrenal glands are normal. There is diffuse stranding surrounding the entire RIGHT kidney. The RIGHT kidney is symmetric in size compared to the LEFT. Size of the RIGHT renal vein is normal. No intrarenal or ureteral stones are identified. There is stranding in the retroperitoneum surrounding the RIGHT ureter to level of the urinary  bladder. The LEFT kidney and ureter are unremarkable. The bladder and visualized portion of the urethra are normal. Stomach/Bowel: Stomach is normal in appearance. Small bowel loops are unremarkable. Moderate stool burden within normal appearing loops of colon. Fecal material within the otherwise normal-appearing appendix. Mesentery is unremarkable. Vascular/Lymphatic: No significant vascular findings are present. No enlarged abdominal or pelvic lymph nodes. Reproductive: Suspect LEFT ovarian cyst/follicle measuring 2.7 centimeters. Region of the RIGHT ovary is unremarkable. Uterus is present. Other: Small fat containing paraumbilical hernia.  No ascites. Musculoskeletal: No acute or significant osseous findings. IMPRESSION: 1. Diffuse stranding surrounding the entire RIGHT kidney and RIGHT ureter to level of the urinary bladder. Findings are consistent with pyelonephritis and ureteritis. 2. No intrarenal or ureteral stones. 3. Status post cholecystectomy and sphincterotomy. 4. Small fat containing paraumbilical hernia. 5. Suspect LEFT ovarian cyst/follicle measuring 2.7 centimeters. 6. Moderate stool burden. Electronically Signed   By: Norva Pavlov M.D.   On: 02/29/2020 12:53       Subjective: No new complaints.   Discharge Exam: Vitals:   03/04/20 1104 03/04/20 1413  BP: (!) 168/93 (!) 158/89  Pulse: (!) 111 100  Resp: 18 16  Temp: 99.9 F (37.7 C)   SpO2: 95%    Vitals:   03/04/20 0400 03/04/20 0755 03/04/20 1104 03/04/20 1413  BP: (!) 149/84 (!) 192/108 (!) 168/93 (!) 158/89  Pulse: 89 (!) 105 (!) 111 100  Resp:  16 17 18 16   Temp: 98.2 F (36.8 C) 98.8 F (37.1 C) 99.9 F (37.7 C)   TempSrc: Oral Oral Oral   SpO2: 99% 91% 95%     General: Pt is alert, awake, not in acute distress Cardiovascular: RRR, S1/S2 +, no rubs, no gallops Respiratory: CTA bilaterally, no wheezing, no rhonchi Abdominal: Soft, NT, ND, bowel sounds + Extremities: no edema, no cyanosis    The  results of significant diagnostics from this hospitalization (including imaging, microbiology, ancillary and laboratory) are listed below for reference.     Microbiology: Recent Results (from the past 240 hour(s))  SARS Coronavirus 2 by RT PCR (hospital order, performed in [facility] hospital lab) Nasopharyngeal Nasopharyngeal Swab     Status: None   Collection Time: 02/29/20  4:13 AM   Specimen: Nasopharyngeal Swab  Result Value Ref Range Status   SARS Coronavirus 2 NEGATIVE NEGATIVE Final    Comment: (NOTE) SARS-CoV-2 target nucleic acids are NOT DETECTED.  The SARS-CoV-2 RNA is generally detectable in upper and lower respiratory specimens during the acute phase of infection. The lowest concentration of SARS-CoV-2 viral copies this assay can detect is 250 copies / mL. A negative result does not preclude SARS-CoV-2 infection and should not be used as the sole basis for treatment or other patient management decisions.  A negative result may occur with improper specimen collection / handling, submission of specimen other than nasopharyngeal swab, presence of viral mutation(s) within the areas targeted by this assay, and inadequate number of viral copies (<250 copies / mL). A negative result must be combined with clinical observations, patient history, and epidemiological information.  Fact Sheet for Patients:   BoilerBrush.com.cy  Fact Sheet for Healthcare Providers: https://pope.com/  This test is not yet approved or  cleared by the [place] FDA and has been authorized for detection and/or diagnosis of SARS-CoV-2 by FDA under an Emergency Use Authorization (EUA).  This EUA will remain in effect (meaning this test can be used) for the duration of the COVID-19 declaration under Section 564(b)(1) of the Act, 21 U.S.C. section 360bbb-3(b)(1), unless the authorization is terminated or revoked sooner.  Performed at [facility], [address], [place], [place] [address]   Culture, blood (single)     Status: Abnormal   Collection Time: 02/29/20  1:39 PM   Specimen: BLOOD RIGHT WRIST  Result Value Ref Range Status   Specimen Description BLOOD RIGHT WRIST  Final   Special Requests   Final    BOTTLES DRAWN AEROBIC AND ANAEROBIC Blood Culture results may not be optimal due to an excessive volume of blood received in culture bottles   Culture  Setup Time   Final    GRAM NEGATIVE RODS IN BOTH AEROBIC AND ANAEROBIC BOTTLES CRITICAL RESULT CALLED TO, READ BACK BY AND VERIFIED WITH: Mercer Pod [id number] 03/01/2020 Girtha Hake Performed at [facility], [address], [place], [place] [address]    Culture ESCHERICHIA COLI (A)  Final   Report Status 03/03/2020 FINAL  Final   Organism ID, Bacteria ESCHERICHIA COLI  Final      Susceptibility   Escherichia coli - MIC*    AMPICILLIN >=32 RESISTANT Resistant     CEFAZOLIN <=4 SENSITIVE Sensitive     CEFEPIME <=0.12 SENSITIVE Sensitive     CEFTAZIDIME <=1 SENSITIVE Sensitive     CEFTRIAXONE <=0.25 SENSITIVE Sensitive     CIPROFLOXACIN <=0.25 SENSITIVE Sensitive     GENTAMICIN <=1 SENSITIVE Sensitive  IMIPENEM <=0.25 SENSITIVE Sensitive     TRIMETH/SULFA >=320 RESISTANT Resistant     AMPICILLIN/SULBACTAM 16 INTERMEDIATE Intermediate     PIP/TAZO <=4 SENSITIVE Sensitive     * ESCHERICHIA COLI  Blood Culture ID Panel (Reflexed)     Status: Abnormal   Collection Time: 02/29/20  1:39 PM  Result Value Ref Range Status   Enterococcus faecalis NOT DETECTED NOT DETECTED Final   Enterococcus Faecium NOT DETECTED NOT DETECTED Final   Listeria monocytogenes NOT DETECTED NOT DETECTED Final   Staphylococcus species NOT DETECTED NOT DETECTED Final   Staphylococcus aureus (BCID) NOT DETECTED NOT DETECTED Final   Staphylococcus epidermidis NOT DETECTED NOT DETECTED Final   Staphylococcus lugdunensis NOT DETECTED NOT DETECTED Final   Streptococcus species NOT DETECTED  NOT DETECTED Final   Streptococcus agalactiae NOT DETECTED NOT DETECTED Final   Streptococcus pneumoniae NOT DETECTED NOT DETECTED Final   Streptococcus pyogenes NOT DETECTED NOT DETECTED Final   A.calcoaceticus-baumannii NOT DETECTED NOT DETECTED Final   Bacteroides fragilis NOT DETECTED NOT DETECTED Final   Enterobacterales DETECTED (A) NOT DETECTED Final    Comment: Enterobacterales represent a large order of gram negative bacteria, not a single organism. CRITICAL RESULT CALLED TO, READ BACK BY AND VERIFIED WITH: V. BRYK,PHARMD 0324 03/01/2020 T. TYSOR    Enterobacter cloacae complex NOT DETECTED NOT DETECTED Final   Escherichia coli DETECTED (A) NOT DETECTED Final    Comment: CRITICAL RESULT CALLED TO, READ BACK BY AND VERIFIED WITH: V. BRYK,PHARMD 0539 03/01/2020 T. TYSOR    Klebsiella aerogenes NOT DETECTED NOT DETECTED Final   Klebsiella oxytoca NOT DETECTED NOT DETECTED Final   Klebsiella pneumoniae NOT DETECTED NOT DETECTED Final   Proteus species NOT DETECTED NOT DETECTED Final   Salmonella species NOT DETECTED NOT DETECTED Final   Serratia marcescens NOT DETECTED NOT DETECTED Final   Haemophilus influenzae NOT DETECTED NOT DETECTED Final   Neisseria meningitidis NOT DETECTED NOT DETECTED Final   Pseudomonas aeruginosa NOT DETECTED NOT DETECTED Final   Stenotrophomonas maltophilia NOT DETECTED NOT DETECTED Final   Candida albicans NOT DETECTED NOT DETECTED Final   Candida auris NOT DETECTED NOT DETECTED Final   Candida glabrata NOT DETECTED NOT DETECTED Final   Candida krusei NOT DETECTED NOT DETECTED Final   Candida parapsilosis NOT DETECTED NOT DETECTED Final   Candida tropicalis NOT DETECTED NOT DETECTED Final   Cryptococcus neoformans/gattii NOT DETECTED NOT DETECTED Final   CTX-M ESBL NOT DETECTED NOT DETECTED Final   Carbapenem resistance IMP NOT DETECTED NOT DETECTED Final   Carbapenem resistance KPC NOT DETECTED NOT DETECTED Final   Carbapenem resistance NDM NOT  DETECTED NOT DETECTED Final   Carbapenem resist OXA 48 LIKE NOT DETECTED NOT DETECTED Final   Carbapenem resistance VIM NOT DETECTED NOT DETECTED Final    Comment: Performed at [facility], [address] [place], [place] [address]  Urine culture     Status: Abnormal   Collection Time: 02/29/20  2:14 PM   Specimen: Urine, Random  Result Value Ref Range Status   Specimen Description URINE, RANDOM  Final   Special Requests   Final    NONE Performed at [facility], [address] [place], [place] [address]    Culture >=100,000 COLONIES/mL ESCHERICHIA COLI (A)  Final   Report Status 03/02/2020 FINAL  Final   Organism ID, Bacteria ESCHERICHIA COLI (A)  Final      Susceptibility   Escherichia coli - MIC*    AMPICILLIN >=32 RESISTANT  Resistant     CEFAZOLIN <=4 SENSITIVE Sensitive     CEFTRIAXONE <=0.25 SENSITIVE Sensitive     CIPROFLOXACIN <=0.25 SENSITIVE Sensitive     GENTAMICIN <=1 SENSITIVE Sensitive     IMIPENEM <=0.25 SENSITIVE Sensitive     NITROFURANTOIN <=16 SENSITIVE Sensitive     TRIMETH/SULFA >=320 RESISTANT Resistant     AMPICILLIN/SULBACTAM 16 INTERMEDIATE Intermediate     PIP/TAZO <=4 SENSITIVE Sensitive     * >=100,000 COLONIES/mL ESCHERICHIA COLI  Culture, blood (routine x 2)     Status: Abnormal   Collection Time: 02/29/20  4:49 PM   Specimen: BLOOD  Result Value Ref Range Status   Specimen Description BLOOD SITE NOT SPECIFIED  Final   Special Requests (A)  Final    AEROCOCCUS SPECIES Blood Culture results may not be optimal due to an inadequate volume of blood received in culture bottles   Culture   Final    NO GROWTH 5 DAYS Performed at [facility], [address] [place], [place] [address]    Report Status 03/05/2020 FINAL  Final  Culture, blood (routine x 2)     Status: None   Collection Time: 02/29/20  4:55 PM   Specimen: BLOOD  Result Value Ref Range Status   Specimen Description BLOOD BLOOD LEFT HAND  Final   Special Requests    Final    AEROBIC BOTTLE ONLY Blood Culture results may not be optimal due to an inadequate volume of blood received in culture bottles   Culture   Final    NO GROWTH 5 DAYS Performed at [facility], [address] [place], [place] [address]    Report Status 03/05/2020 FINAL  Final  Culture, blood (routine x 2)     Status: None   Collection Time: 02/29/20  7:58 PM   Specimen: BLOOD  Result Value Ref Range Status   Specimen Description BLOOD RIGHT ARM  Final   Special Requests   Final    BOTTLES DRAWN AEROBIC AND ANAEROBIC Blood Culture adequate volume   Culture   Final    NO GROWTH 5 DAYS Performed at [facility], [address], [place], [place] [address]    Report Status 03/05/2020 FINAL  Final  Culture, blood (routine x 2)     Status: None   Collection Time: 02/29/20  8:03 PM   Specimen: BLOOD RIGHT HAND  Result Value Ref Range Status   Specimen Description BLOOD RIGHT HAND  Final   Special Requests   Final    BOTTLES DRAWN AEROBIC AND ANAEROBIC Blood Culture adequate volume   Culture   Final    NO GROWTH 5 DAYS Performed at [facility], [address] [place], [place] [address]    Report Status 03/05/2020 FINAL  Final     Labs: BNP (last 3 results) No results for input(s): BNP in the last 8760 hours. Basic Metabolic Panel: Recent Labs  Lab 02/29/20 0418 02/29/20 1108 02/29/20 1339 02/29/20 1655 03/01/20 0405 03/01/20 1143 03/02/20 0557 03/03/20 0114 03/04/20 0457  NA   < >  --   --   --  135 137 137 136 135  K   < >  --   --   --  3.3* 3.8 4.2 3.7 3.7  CL   < >  --   --   --  106 109 111 110 108  CO2   < >  --   --   --  17* 16* 19* 15* 18*  GLUCOSE  --   --   --    < > 293* 234* 186* 177* 207*  BUN   < >  --   --   --  22* 24* CREATININE   < >  --  1.30*   < > 1.27* 1.23* 0.94 0.89 0.75  CALCIUM   < >  --   --   --  7.2* 7.2* 7.3* 7.8* 7.8*  MG  --  1.8  --   --   --   --   --  2.1  --   PHOS  --   --  1.3*  --   --   --    --   --   --    < > = values in this interval not displayed.   Liver Function Tests: Recent Labs  Lab 02/29/20 0418 03/01/20 0405 03/02/20 0557 03/03/20 0114 03/04/20 0457  AST 52* ALT 38 ALKPHOS 166* 152* 155* 241* 237*  BILITOT 1.5* 1.4* 0.6 0.8 1.0  PROT 7.0 5.3* 5.4* 6.2* 5.3*  ALBUMIN 3.3* 2.3* 2.1* 2.3* 2.0*   Recent Labs  Lab 02/29/20 0418  LIPASE 21   No results for input(s): AMMONIA in the last 168 hours. CBC: Recent Labs  Lab 02/29/20 1339 03/01/20 0405 03/02/20 0557 03/03/20 0114 03/04/20 0457  WBC 8.1 24.7* 23.6* 15.0* 7.0  NEUTROABS  --   --  22.4* 12.6* 4.7  HGB 11.6* 10.6* 10.2* 11.2* 10.3*  HCT 36.1 31.8* 31.4* 34.6* 31.0*  MCV 86.4 85.7 87.0 86.1 85.9  PLT 166 155 142* 139* 148*   Cardiac Enzymes: No results for input(s): CKTOTAL, CKMB, CKMBINDEX, TROPONINI in the last 168 hours. BNP: Invalid input(s): POCBNP CBG: Recent Labs  Lab 03/03/20 2033 03/04/20 0010 03/04/20 0441 03/04/20 0757 03/04/20 1106  GLUCAP 287* 233* 184* 151* 193*   D-Dimer No results for input(s): DDIMER in the last 72 hours. Hgb A1c No results for input(s): HGBA1C in the last 72 hours. Lipid Profile No results for input(s): CHOL, HDL, LDLCALC, TRIG, CHOLHDL, LDLDIRECT in the last 72 hours. Thyroid function studies No results for input(s): TSH, T4TOTAL, T3FREE, THYROIDAB in the last 72 hours.  Invalid input(s): FREET3 Anemia work up No results for input(s): VITAMINB12, FOLATE, FERRITIN, TIBC, IRON, RETICCTPCT in the last 72 hours. Urinalysis    Component Value Date/Time   COLORURINE AMBER (A) 02/29/2020 1204   APPEARANCEUR CLOUDY (A) 02/29/2020 1204   APPEARANCEUR Clear 11/01/2019 0839   LABSPEC 1.025 02/29/2020 1204   PHURINE 5.0 02/29/2020 1204   GLUCOSEU >=500 (A) 02/29/2020 1204   HGBUR LARGE (A) 02/29/2020 1204   HGBUR negative 10/05/2010 1019   BILIRUBINUR NEGATIVE 02/29/2020 1204   BILIRUBINUR Negative 11/01/2019 0839    KETONESUR 20 (A) 02/29/2020 1204   PROTEINUR 100 (A) 02/29/2020 1204   UROBILINOGEN 0.2 06/17/2013 1110   NITRITE NEGATIVE 02/29/2020 1204   LEUKOCYTESUR MODERATE (A) 02/29/2020 1204   Sepsis Labs Invalid input(s): PROCALCITONIN,  WBC,  LACTICIDVEN Microbiology Recent Results (from the past 240 hour(s))  SARS Coronavirus 2 by RT PCR (hospital order, performed in [facility] hospital lab) Nasopharyngeal Nasopharyngeal Swab     Status: None   Collection Time: 02/29/20  4:13 AM   Specimen: Nasopharyngeal Swab  Result Value Ref Range Status   SARS Coronavirus 2 NEGATIVE NEGATIVE Final    Comment: (NOTE) SARS-CoV-2 target nucleic acids  are NOT DETECTED.  The SARS-CoV-2 RNA is generally detectable in upper and lower respiratory specimens during the acute phase of infection. The lowest concentration of SARS-CoV-2 viral copies this assay can detect is 250 copies / mL. A negative result does not preclude SARS-CoV-2 infection and should not be used as the sole basis for treatment or other patient management decisions.  A negative result may occur with improper specimen collection / handling, submission of specimen other than nasopharyngeal swab, presence of viral mutation(s) within the areas targeted by this assay, and inadequate number of viral copies (<250 copies / mL). A negative result must be combined with clinical observations, patient history, and epidemiological information.  Fact Sheet for Patients:   BoilerBrush.com.cy  Fact Sheet for Healthcare Providers: https://pope.com/  This test is not yet approved or  cleared by the [place] FDA and has been authorized for detection and/or diagnosis of SARS-CoV-2 by FDA under an Emergency Use Authorization (EUA).  This EUA will remain in effect (meaning this test can be used) for the duration of the COVID-19 declaration under Section 564(b)(1) of the Act, 21 U.S.C. section  360bbb-3(b)(1), unless the authorization is terminated or revoked sooner.  Performed at [facility], [address], [place], [place] [address]   Culture, blood (single)     Status: Abnormal   Collection Time: 02/29/20  1:39 PM   Specimen: BLOOD RIGHT WRIST  Result Value Ref Range Status   Specimen Description BLOOD RIGHT WRIST  Final   Special Requests   Final    BOTTLES DRAWN AEROBIC AND ANAEROBIC Blood Culture results may not be optimal due to an excessive volume of blood received in culture bottles   Culture  Setup Time   Final    GRAM NEGATIVE RODS IN BOTH AEROBIC AND ANAEROBIC BOTTLES CRITICAL RESULT CALLED TO, READ BACK BY AND VERIFIED WITH: Mercer Pod [id number] 03/01/2020 Girtha Hake Performed at [facility], [address] [place], [place] [address]    Culture ESCHERICHIA COLI (A)  Final   Report Status 03/03/2020 FINAL  Final   Organism ID, Bacteria ESCHERICHIA COLI  Final      Susceptibility   Escherichia coli - MIC*    AMPICILLIN >=32 RESISTANT Resistant     CEFAZOLIN <=4 SENSITIVE Sensitive     CEFEPIME <=0.12 SENSITIVE Sensitive     CEFTAZIDIME <=1 SENSITIVE Sensitive     CEFTRIAXONE <=0.25 SENSITIVE Sensitive     CIPROFLOXACIN <=0.25 SENSITIVE Sensitive     GENTAMICIN <=1 SENSITIVE Sensitive     IMIPENEM <=0.25 SENSITIVE Sensitive     TRIMETH/SULFA >=320 RESISTANT Resistant     AMPICILLIN/SULBACTAM 16 INTERMEDIATE Intermediate     PIP/TAZO <=4 SENSITIVE Sensitive     * ESCHERICHIA COLI  Blood Culture ID Panel (Reflexed)     Status: Abnormal   Collection Time: 02/29/20  1:39 PM  Result Value Ref Range Status   Enterococcus faecalis NOT DETECTED NOT DETECTED Final   Enterococcus Faecium NOT DETECTED NOT DETECTED Final   Listeria monocytogenes NOT DETECTED NOT DETECTED Final   Staphylococcus species NOT DETECTED NOT DETECTED Final   Staphylococcus aureus (BCID) NOT DETECTED NOT DETECTED Final   Staphylococcus epidermidis NOT DETECTED NOT DETECTED  Final   Staphylococcus lugdunensis NOT DETECTED NOT DETECTED Final   Streptococcus species NOT DETECTED NOT DETECTED Final   Streptococcus agalactiae NOT DETECTED NOT DETECTED Final   Streptococcus pneumoniae NOT DETECTED NOT DETECTED Final   Streptococcus pyogenes NOT DETECTED NOT DETECTED Final  A.calcoaceticus-baumannii NOT DETECTED NOT DETECTED Final   Bacteroides fragilis NOT DETECTED NOT DETECTED Final   Enterobacterales DETECTED (A) NOT DETECTED Final    Comment: Enterobacterales represent a large order of gram negative bacteria, not a single organism. CRITICAL RESULT CALLED TO, READ BACK BY AND VERIFIED WITH: V. BRYK,PHARMD 0324 03/01/2020 T. TYSOR    Enterobacter cloacae complex NOT DETECTED NOT DETECTED Final   Escherichia coli DETECTED (A) NOT DETECTED Final    Comment: CRITICAL RESULT CALLED TO, READ BACK BY AND VERIFIED WITH: V. BRYK,PHARMD 1610 03/01/2020 T. TYSOR    Klebsiella aerogenes NOT DETECTED NOT DETECTED Final   Klebsiella oxytoca NOT DETECTED NOT DETECTED Final   Klebsiella pneumoniae NOT DETECTED NOT DETECTED Final   Proteus species NOT DETECTED NOT DETECTED Final   Salmonella species NOT DETECTED NOT DETECTED Final   Serratia marcescens NOT DETECTED NOT DETECTED Final   Haemophilus influenzae NOT DETECTED NOT DETECTED Final   Neisseria meningitidis NOT DETECTED NOT DETECTED Final   Pseudomonas aeruginosa NOT DETECTED NOT DETECTED Final   Stenotrophomonas maltophilia NOT DETECTED NOT DETECTED Final   Candida albicans NOT DETECTED NOT DETECTED Final   Candida auris NOT DETECTED NOT DETECTED Final   Candida glabrata NOT DETECTED NOT DETECTED Final   Candida krusei NOT DETECTED NOT DETECTED Final   Candida parapsilosis NOT DETECTED NOT DETECTED Final   Candida tropicalis NOT DETECTED NOT DETECTED Final   Cryptococcus neoformans/gattii NOT DETECTED NOT DETECTED Final   CTX-M ESBL NOT DETECTED NOT DETECTED Final   Carbapenem resistance IMP NOT DETECTED NOT  DETECTED Final   Carbapenem resistance KPC NOT DETECTED NOT DETECTED Final   Carbapenem resistance NDM NOT DETECTED NOT DETECTED Final   Carbapenem resist OXA 48 LIKE NOT DETECTED NOT DETECTED Final   Carbapenem resistance VIM NOT DETECTED NOT DETECTED Final    Comment: Performed at [facility], [address], [place], [place] [address]  Urine culture     Status: Abnormal   Collection Time: 02/29/20  2:14 PM   Specimen: Urine, Random  Result Value Ref Range Status   Specimen Description URINE, RANDOM  Final   Special Requests   Final    NONE Performed at [facility], [address], [place], [place] [address]    Culture >=100,000 COLONIES/mL ESCHERICHIA COLI (A)  Final   Report Status 03/02/2020 FINAL  Final   Organism ID, Bacteria ESCHERICHIA COLI (A)  Final      Susceptibility   Escherichia coli - MIC*    AMPICILLIN >=32 RESISTANT Resistant     CEFAZOLIN <=4 SENSITIVE Sensitive     CEFTRIAXONE <=0.25 SENSITIVE Sensitive     CIPROFLOXACIN <=0.25 SENSITIVE Sensitive     GENTAMICIN <=1 SENSITIVE Sensitive     IMIPENEM <=0.25 SENSITIVE Sensitive     NITROFURANTOIN <=16 SENSITIVE Sensitive     TRIMETH/SULFA >=320 RESISTANT Resistant     AMPICILLIN/SULBACTAM 16 INTERMEDIATE Intermediate     PIP/TAZO <=4 SENSITIVE Sensitive     * >=100,000 COLONIES/mL ESCHERICHIA COLI  Culture, blood (routine x 2)     Status: Abnormal   Collection Time: 02/29/20  4:49 PM   Specimen: BLOOD  Result Value Ref Range Status   Specimen Description BLOOD SITE NOT SPECIFIED  Final   Special Requests (A)  Final    AEROCOCCUS SPECIES Blood Culture results may not be optimal due to an inadequate volume of blood received in culture bottles   Culture   Final    NO GROWTH 5  DAYS Performed at [facility], [address] [place], [place] [address]    Report Status 03/05/2020 FINAL  Final  Culture, blood (routine x 2)     Status: None   Collection Time: 02/29/20  4:55 PM   Specimen:  BLOOD  Result Value Ref Range Status   Specimen Description BLOOD BLOOD LEFT HAND  Final   Special Requests   Final    AEROBIC BOTTLE ONLY Blood Culture results may not be optimal due to an inadequate volume of blood received in culture bottles   Culture   Final    NO GROWTH 5 DAYS Performed at [facility], [address] [place], [place] [address]    Report Status 03/05/2020 FINAL  Final  Culture, blood (routine x 2)     Status: None   Collection Time: 02/29/20  7:58 PM   Specimen: BLOOD  Result Value Ref Range Status   Specimen Description BLOOD RIGHT ARM  Final   Special Requests   Final    BOTTLES DRAWN AEROBIC AND ANAEROBIC Blood Culture adequate volume   Culture   Final    NO GROWTH 5 DAYS Performed at [facility], [address] [place], [place] [address]    Report Status 03/05/2020 FINAL  Final  Culture, blood (routine x 2)     Status: None   Collection Time: 02/29/20  8:03 PM   Specimen: BLOOD RIGHT HAND  Result Value Ref Range Status   Specimen Description BLOOD RIGHT HAND  Final   Special Requests   Final    BOTTLES DRAWN AEROBIC AND ANAEROBIC Blood Culture adequate volume   Culture   Final    NO GROWTH 5 DAYS Performed at [facility], [address] [place], [place] [address]    Report Status 03/05/2020 FINAL  Final     Time coordinating discharge: 32 minutes.   SIGNED:   Kathlen ModyVijaya Alisse Tuite, MD  [facility] 03/05/2020, 8:52 AM  Password [email or web address]

## 2020-04-06 ENCOUNTER — Encounter: Payer: Self-pay | Admitting: Family Medicine

## 2020-04-06 ENCOUNTER — Ambulatory Visit: Payer: Self-pay | Attending: Family Medicine | Admitting: Family Medicine

## 2020-04-06 ENCOUNTER — Other Ambulatory Visit: Payer: Self-pay

## 2020-04-06 DIAGNOSIS — N12 Tubulo-interstitial nephritis, not specified as acute or chronic: Secondary | ICD-10-CM

## 2020-04-06 DIAGNOSIS — E1165 Type 2 diabetes mellitus with hyperglycemia: Secondary | ICD-10-CM

## 2020-04-06 DIAGNOSIS — I10 Essential (primary) hypertension: Secondary | ICD-10-CM

## 2020-04-06 MED ORDER — ATORVASTATIN CALCIUM 20 MG PO TABS: 20.0000 mg | ORAL_TABLET | Freq: Every day | ORAL | 3 refills | Status: DC

## 2020-04-06 MED ORDER — GLIPIZIDE 10 MG PO TABS: 10.0000 mg | ORAL_TABLET | Freq: Two times a day (BID) | ORAL | 3 refills | Status: DC

## 2020-04-06 MED ORDER — LEVOFLOXACIN 750 MG PO TABS: 750.0000 mg | ORAL_TABLET | Freq: Every day | ORAL | 0 refills | Status: DC

## 2020-04-06 MED ORDER — METFORMIN HCL 500 MG PO TABS: 1000.0000 mg | ORAL_TABLET | Freq: Two times a day (BID) | ORAL | 3 refills | Status: DC

## 2020-04-06 NOTE — Progress Notes (Signed)
Virtual Visit via Telephone Note  I connected with Rhonda Waller, on 04/06/2020 at 9:26 AM by telephone due to the COVID-19 pandemic and verified that I am speaking with the correct person using two identifiers.   Consent: I discussed the limitations, risks, security and privacy concerns of performing an evaluation and management service by telephone and the availability of in person appointments. I also discussed with the patient that there may be a patient responsible charge related to this service. The patient expressed understanding and agreed to proceed.   Location of Patient: Home  Location of Provider: Clinic   Persons participating in Telemedicine visit: Lorie Corey Caulfield Dr. Forest Becker [phone] - Interpreter    History of Present Illness: She is a 44 year old female with a history of Hypertension, Gestational Diabetes, Type 2 DM (last A1c 12.7 from 02/2020).   Hospitalized 02/29/20- 03/04/20 for sepsis secondary to Pyelonephritis secondary to E.Coli at [facility]. Blood culture and Urine culture grew E.Coli. Treated with IV Rocephin and discharged on Cipro. CT renal stone protocol revealed: IMPRESSION: 1. Diffuse stranding surrounding the entire RIGHT kidney and RIGHT ureter to level of the urinary bladder. Findings are consistent with pyelonephritis and ureteritis. 2. No intrarenal or ureteral stones. 3. Status post cholecystectomy and sphincterotomy. 4. Small fat containing paraumbilical hernia. 5. Suspect LEFT ovarian cyst/follicle measuring 2.7 centimeters. 6. Moderate stool burden.  Still has R flank pain and also has dysuria, lower abdominal pain but denies presence of fever.,  Nausea or vomiting.  Blood sugars are 170 -fasting; random - 270, 276.  She endorses compliance with 20 units of Lantus, Metformin 1000 units twice daily, glipizide 5 mg twice daily.  Denies presence of hypoglycemia. Compliant with her  antihypertensive and denies adverse effects from her medications. She has no additional concerns today.  Past Medical History:  Diagnosis Date  . Diabetes mellitus without complication (HCC) 2012  . Pregnancy induced hypertension    No Known Allergies  Current Outpatient Medications on File Prior to Visit  Medication Sig Dispense Refill  . amLODipine (NORVASC) 10 MG tablet Take 1 tablet (10 mg total) by mouth daily. 30 tablet 2  . aspirin EC 325 MG tablet Take 325 mg by mouth daily as needed (pain/headache).    . etonogestrel (NEXPLANON) 68 MG IMPL implant 68 mg by Subdermal route once.     Marland Kitchen glipiZIDE (GLUCOTROL) 5 MG tablet Take 1 tablet (5 mg total) by mouth 2 (two) times daily before a meal. 60 tablet 3  . insulin glargine (LANTUS) 100 UNIT/ML injection Inject 0.2 mLs (20 Units total) into the skin daily. 10 mL 11  . lisinopril (ZESTRIL) 40 MG tablet Take 1 tablet (40 mg total) by mouth daily. 30 tablet 3  . metFORMIN (GLUCOPHAGE) 500 MG tablet Take 2 tablets (1,000 mg total) by mouth 2 (two) times daily. (Patient taking differently: Take 500 mg by mouth 2 (two) times daily. ) 120 tablet 3   No current facility-administered medications on file prior to visit.    Observations/Objective: Awake, alert, oriented x3 Not in acute distress  Lab Results  Component Value Date   HGBA1C 12.7 (H) 02/29/2020    CMP Latest Ref Rng & Units 03/04/2020 03/03/2020 03/02/2020  Glucose 70 - 99 mg/dL 188(C) 166(A) 630(Z)  BUN 6 - 20 mg/dL 9 13 18   Creatinine 0.44 - 1.00 mg/dL 6.01 0.93  Sodium 135 - 145 mmol/L 135 136 137  Potassium 3.5 - 5.1 mmol/L 3.7 3.7 4.2  Chloride 98 - 111 mmol/L 108 110 111  CO2 22 - 32 mmol/L 18(L) 15(L) 19(L)  Calcium 8.9 - 10.3 mg/dL 7.8(L) 7.8(L) 7.3(L)  Total Protein 6.5 - 8.1 g/dL 5.3(L) 6.2(L) 5.4(L)  Total Bilirubin 0.3 - 1.2 mg/dL 1.0 0.8 0.6  Alkaline Phos 38 - 126 U/L 237(H) 241(H) 155(H)  AST 15 - 41 U/L 27 20 23   ALT 0 - 44 U/L 21 22 22     Lipid  Panel     Component Value Date/Time   CHOL 161 11/01/2019 0839   TRIG 112 11/01/2019 0839   HDL 61 11/01/2019 0839   CHOLHDL 2.6 11/01/2019 0839   CHOLHDL 3.2 Ratio 05/06/2010 2146   VLDL 38 05/06/2010 2146   LDLCALC 80 11/01/2019 0839   LABVLDL 20 11/01/2019 0839    CBC Latest Ref Rng & Units 03/04/2020 03/03/2020 03/02/2020  WBC 4.0 - 10.5 K/uL 7.0 15.0(H) 23.6(H)  Hemoglobin 12.0 - 15.0 g/dL 10.3(L) 11.2(L) 10.2(L)  Hematocrit 36 - 46 % 31.0(L) 34.6(L) 31.4(L)  Platelets 150 - 400 K/uL 148(L) 139(L) 142(L)    Assessment and Plan: 1. Type 2 diabetes mellitus with hyperglycemia, without long-term current use of insulin (HCC) Uncontrolled with A1c of 12.7; goal is less than 7.0 Increase glipizide dose from 5 mg twice daily to 10 mg twice daily Continue other medications Statin added to regimen due to cardiovascular benefit Counseled on Diabetic diet, my plate method, 05/03/2020 minutes of moderate intensity exercise/week Blood sugar logs with fasting goals of 80-120 mg/dl, random of less than 05/02/2020 and in the event of sugars less than 60 mg/dl or greater than 967 mg/dl encouraged to notify the clinic. Advised on the need for annual eye exams, annual foot exams, Pneumonia vaccine. - metFORMIN (GLUCOPHAGE) 500 MG tablet; Take 2 tablets (1,000 mg total) by mouth 2 (two) times daily.  Dispense: 120 tablet; Refill: 3 - glipiZIDE (GLUCOTROL) 10 MG tablet; Take 1 tablet (10 mg total) by mouth 2 (two) times daily before a meal.  Dispense: 60 tablet; Refill: 3 - atorvastatin (LIPITOR) 20 MG tablet; Take 1 tablet (20 mg total) by mouth daily.  Dispense: 30 tablet; Refill: 3  2. Pyelonephritis of right kidney Uncontrolled Completed treatment with ceftriaxone and ciprofloxacin We will place on Levaquin Advised to present to the clinic if symptoms persist Increase oral hydration - levofloxacin (LEVAQUIN) 750 MG tablet; Take 1 tablet (750 mg total) by mouth daily.  Dispense: 7 tablet; Refill: 0  3.  Essential hypertension Controlled Continue lisinopril and amlodipine Counseled on blood pressure goal of less than 130/80, low-sodium, DASH diet, medication compliance, 150 minutes of moderate intensity exercise per week. Discussed medication compliance, adverse effects.    Follow Up Instructions: 3 months for chronic disease management   I discussed the assessment and treatment plan with the patient. The patient was provided an opportunity to ask questions and all were answered. The patient agreed with the plan and demonstrated an understanding of the instructions.   The patient was advised to call back or seek an in-person evaluation if the symptoms worsen or if the condition fails to improve as anticipated.     I provided 18 minutes total of non-face-to-face time during this encounter including median intraservice time, reviewing previous notes, investigations, ordering medications, medical decision making, coordinating care and patient verbalized understanding at the end of the visit.     893, MD, FAAFP. [facility] [place], [facility] [place]   04/06/2020, 9:26 AM

## 2020-04-06 NOTE — Progress Notes (Signed)
ED follow up from 02/29/20  Having pain in right side of abdomen, has burning while urinating.

## 2020-09-07 ENCOUNTER — Other Ambulatory Visit: Payer: Self-pay | Admitting: Family Medicine

## 2020-09-07 DIAGNOSIS — E1165 Type 2 diabetes mellitus with hyperglycemia: Secondary | ICD-10-CM

## 2020-09-07 DIAGNOSIS — I10 Essential (primary) hypertension: Secondary | ICD-10-CM

## 2020-09-07 MED ORDER — GLIPIZIDE 10 MG PO TABS: 10.0000 mg | ORAL_TABLET | Freq: Two times a day (BID) | ORAL | 0 refills | Status: DC

## 2020-09-07 MED ORDER — METFORMIN HCL 500 MG PO TABS: 1000.0000 mg | ORAL_TABLET | Freq: Two times a day (BID) | ORAL | 0 refills | Status: DC

## 2020-09-07 MED ORDER — LISINOPRIL 40 MG PO TABS: 40.0000 mg | ORAL_TABLET | Freq: Every day | ORAL | 0 refills | Status: DC

## 2020-09-07 NOTE — Telephone Encounter (Signed)
Requested medication (s) are due for refill today:yes  Requested medication (s) are on the active medication list: yes  Last refill: 03/04/20  #30  3 refills  Future visit scheduled yes 10/21/20  Notes to clinic:  Historical provider, please review  Requested Prescriptions  Pending Prescriptions Disp Refills   lisinopril (ZESTRIL) 40 MG tablet 30 tablet 3    Sig: Take 1 tablet (40 mg total) by mouth daily.      Cardiovascular:  ACE Inhibitors Failed - 09/07/2020 11:08 AM      Failed - Cr in normal range and within 180 days    Creat  Date Value Ref Range Status  05/06/2013 0.56 0.50 - 1.10 mg/dL Final   Creatinine, Ser  Date Value Ref Range Status  03/04/2020 0.75 0.44 - 1.00 mg/dL Final   Creatinine, Urine  Date Value Ref Range Status  06/17/2013 39.11 mg/dL Final          Failed - K in normal range and within 180 days    Potassium  Date Value Ref Range Status  03/04/2020 3.7 3.5 - 5.1 mmol/L Final          Failed - Last BP in normal range    BP Readings from Last 1 Encounters:  03/04/20 (!) 158/89          Passed - Patient is not pregnant      Passed - Valid encounter within last 6 months    Recent Outpatient Visits           5 months ago Pyelonephritis of right kidney   [place], Charlane Ferretti, MD   10 months ago Essential hypertension   [place], [place], MD       Future Appointments             In 1 month Gildardo Pounds, NP [place]              Signed Prescriptions Disp Refills   metFORMIN (GLUCOPHAGE) 500 MG tablet 120 tablet 0    Sig: Take 2 tablets (1,000 mg total) by mouth 2 (two) times daily.      Endocrinology:  Diabetes - Biguanides Failed - 09/07/2020 11:08 AM      Failed - HBA1C is between 0 and 7.9 and within 180 days    Hgb A1c MFr Bld  Date Value Ref Range Status  02/29/2020 12.7 (H) 4.8 - 5.6 % Final    Comment:     (NOTE) Pre diabetes:          5.7%-6.4%  Diabetes:              >6.4%  Glycemic control for   <7.0% adults with diabetes           Passed - Cr in normal range and within 360 days    Creat  Date Value Ref Range Status  05/06/2013 0.56 0.50 - 1.10 mg/dL Final   Creatinine, Ser  Date Value Ref Range Status  03/04/2020 0.75 0.44 - 1.00 mg/dL Final   Creatinine, Urine  Date Value Ref Range Status  06/17/2013 39.11 mg/dL Final          Passed - eGFR in normal range and within 360 days    GFR calc Af Amer  Date Value Ref Range Status  03/04/2020 >60 >60 mL/min Final   GFR calc non Af Wyvonnia Lora  Date Value Ref Range Status  03/04/2020 >60 >60 mL/min Final          Passed - Valid encounter within last 6 months    Recent Outpatient Visits           5 months ago Pyelonephritis of right kidney   [place], [place], MD   10 months ago Essential hypertension   [place], Charlane Ferretti, MD       Future Appointments             In 1 month Gildardo Pounds, NP [place]               glipiZIDE (GLUCOTROL) 10 MG tablet 60 tablet 0    Sig: Take 1 tablet (10 mg total) by mouth 2 (two) times daily before a meal.      Endocrinology:  Diabetes - Sulfonylureas Failed - 09/07/2020 11:08 AM      Failed - HBA1C is between 0 and 7.9 and within 180 days    Hgb A1c MFr Bld  Date Value Ref Range Status  02/29/2020 12.7 (H) 4.8 - 5.6 % Final    Comment:    (NOTE) Pre diabetes:          5.7%-6.4%  Diabetes:              >6.4%  Glycemic control for   <7.0% adults with diabetes           Passed - Valid encounter within last 6 months    Recent Outpatient Visits           5 months ago Pyelonephritis of right kidney   [place], Charlane Ferretti, MD   10 months ago Essential hypertension   [place],  [place], MD       Future Appointments             In 1 month Gildardo Pounds, NP [place]

## 2020-09-07 NOTE — Telephone Encounter (Signed)
metFORMIN (GLUCOPHAGE) 500 MG tablet  glipiZIDE (GLUCOTROL) 10 MG tablet  lisinopril (ZESTRIL) 40 MG tablet  [facility] [address] [place] - [address]  [address] [place] [address]  Phone: [phone] Fax: [phone]   Pt scheduled the soonest available appt. She is out of all of the medications listed above. Please advise

## 2020-10-21 ENCOUNTER — Telehealth: Payer: Self-pay | Admitting: Family Medicine

## 2020-10-21 ENCOUNTER — Ambulatory Visit: Payer: Self-pay | Admitting: Nurse Practitioner

## 2020-10-21 NOTE — Telephone Encounter (Signed)
Called patient using interpreter services and LVM advising her that her appointment had been cancelled and she needs to reschedule. Advised patient to call [phone] to schedule.

## 2020-12-22 ENCOUNTER — Emergency Department (HOSPITAL_COMMUNITY): Payer: Self-pay

## 2020-12-22 ENCOUNTER — Other Ambulatory Visit: Payer: Self-pay

## 2020-12-22 ENCOUNTER — Encounter (HOSPITAL_COMMUNITY): Payer: Self-pay | Admitting: Emergency Medicine

## 2020-12-22 ENCOUNTER — Emergency Department (HOSPITAL_COMMUNITY)
Admission: EM | Admit: 2020-12-22 | Discharge: 2020-12-23 | Disposition: A | Discharge status: Home / Self Care | Payer: Self-pay | Attending: Emergency Medicine | Admitting: Emergency Medicine

## 2020-12-22 DIAGNOSIS — E119 Type 2 diabetes mellitus without complications: Secondary | ICD-10-CM | POA: Insufficient documentation

## 2020-12-22 DIAGNOSIS — Z7982 Long term (current) use of aspirin: Secondary | ICD-10-CM | POA: Insufficient documentation

## 2020-12-22 DIAGNOSIS — E1165 Type 2 diabetes mellitus with hyperglycemia: Secondary | ICD-10-CM

## 2020-12-22 DIAGNOSIS — Z7984 Long term (current) use of oral hypoglycemic drugs: Secondary | ICD-10-CM | POA: Insufficient documentation

## 2020-12-22 DIAGNOSIS — R079 Chest pain, unspecified: Secondary | ICD-10-CM

## 2020-12-22 DIAGNOSIS — I10 Essential (primary) hypertension: Secondary | ICD-10-CM | POA: Insufficient documentation

## 2020-12-22 DIAGNOSIS — Z79899 Other long term (current) drug therapy: Secondary | ICD-10-CM | POA: Insufficient documentation

## 2020-12-22 DIAGNOSIS — Z794 Long term (current) use of insulin: Secondary | ICD-10-CM | POA: Insufficient documentation

## 2020-12-22 LAB — CBC WITH DIFFERENTIAL/PLATELET
Abs Immature Granulocytes: 0.02 10*3/uL (ref 0.00–0.07)
Basophils Absolute: 0.1 10*3/uL (ref 0.0–0.1)
Basophils Relative: 1 %
Eosinophils Absolute: 0.2 10*3/uL (ref 0.0–0.5)
Eosinophils Relative: 2 %
HCT: 39.2 % (ref 36.0–46.0)
Hemoglobin: 12.8 g/dL (ref 12.0–15.0)
Immature Granulocytes: 0 %
Lymphocytes Relative: 31 %
Lymphs Abs: 3.1 10*3/uL (ref 0.7–4.0)
MCH: 28.1 pg (ref 26.0–34.0)
MCHC: 32.7 g/dL (ref 30.0–36.0)
MCV: 86 fL (ref 80.0–100.0)
Monocytes Absolute: 0.5 10*3/uL (ref 0.1–1.0)
Monocytes Relative: 5 %
Neutro Abs: 6 10*3/uL (ref 1.7–7.7)
Neutrophils Relative %: 61 %
Platelets: 263 10*3/uL (ref 150–400)
RBC: 4.56 MIL/uL (ref 3.87–5.11)
RDW: 14 % (ref 11.5–15.5)
WBC: 10 10*3/uL (ref 4.0–10.5)
nRBC: 0 % (ref 0.0–0.2)

## 2020-12-22 LAB — URINALYSIS, ROUTINE W REFLEX MICROSCOPIC
Bilirubin Urine: NEGATIVE
Glucose, UA: NEGATIVE mg/dL
Hgb urine dipstick: NEGATIVE
Ketones, ur: NEGATIVE mg/dL
Leukocytes,Ua: NEGATIVE
Nitrite: NEGATIVE
Protein, ur: NEGATIVE mg/dL
Specific Gravity, Urine: 1.004 — ABNORMAL LOW (ref 1.005–1.030)
pH: 6 (ref 5.0–8.0)

## 2020-12-22 LAB — BASIC METABOLIC PANEL
Anion gap: 8 (ref 5–15)
BUN: 21 mg/dL — ABNORMAL HIGH (ref 6–20)
CO2: 24 mmol/L (ref 22–32)
Calcium: 9 mg/dL (ref 8.9–10.3)
Chloride: 107 mmol/L (ref 98–111)
Creatinine, Ser: 0.96 mg/dL (ref 0.44–1.00)
GFR, Estimated: >60 mL/min (ref >60)
Glucose, Bld: 162 mg/dL — ABNORMAL HIGH (ref 70–99)
Potassium: 3.9 mmol/L (ref 3.5–5.1)
Sodium: 139 mmol/L (ref 135–145)

## 2020-12-22 LAB — I-STAT BETA HCG BLOOD, ED (MC, WL, AP ONLY): I-stat hCG, quantitative: <5 m[IU]/mL (ref <5)

## 2020-12-22 LAB — TROPONIN I (HIGH SENSITIVITY)
Troponin I (High Sensitivity): 5 ng/L (ref <18)
Troponin I (High Sensitivity): 5 ng/L (ref <18)

## 2020-12-22 MED ORDER — AMLODIPINE BESYLATE 10 MG PO TABS: 10.0000 mg | ORAL_TABLET | Freq: Every day | ORAL | 2 refills | Status: DC

## 2020-12-22 MED ORDER — METFORMIN HCL 500 MG PO TABS: 1000.0000 mg | ORAL_TABLET | Freq: Two times a day (BID) | ORAL | 0 refills | Status: DC

## 2020-12-22 MED ORDER — NITROGLYCERIN 2 % TD OINT
1.0000 [in_us] | TOPICAL_OINTMENT | Freq: Four times a day (QID) | TRANSDERMAL | Status: DC
  Administered 2020-12-22: 1 [in_us] via TOPICAL
  Filled 2020-12-22: qty 1

## 2020-12-22 MED ORDER — ASPIRIN 81 MG PO CHEW
324.0000 mg | CHEWABLE_TABLET | Freq: Once | ORAL | Status: AC
  Administered 2020-12-22: 324 mg via ORAL
  Filled 2020-12-22: qty 4

## 2020-12-22 MED ORDER — METOPROLOL TARTRATE 5 MG/5ML IV SOLN
5.0000 mg | Freq: Once | INTRAVENOUS | Status: AC
  Administered 2020-12-22: 5 mg via INTRAVENOUS
  Filled 2020-12-22: qty 5

## 2020-12-22 MED ORDER — AMLODIPINE BESYLATE 5 MG PO TABS
10.0000 mg | ORAL_TABLET | Freq: Once | ORAL | Status: AC
  Administered 2020-12-22: 10 mg via ORAL
  Filled 2020-12-22: qty 2

## 2020-12-22 MED ORDER — LISINOPRIL 40 MG PO TABS: 40.0000 mg | ORAL_TABLET | Freq: Every day | ORAL | 0 refills | Status: DC

## 2020-12-22 MED ORDER — LISINOPRIL 20 MG PO TABS
40.0000 mg | ORAL_TABLET | Freq: Once | ORAL | Status: AC
  Administered 2020-12-22: 40 mg via ORAL
  Filled 2020-12-22: qty 2

## 2020-12-22 NOTE — Discharge Instructions (Addendum)
Make sure to take your blood pressure medications.  Follow-up with your primary care doctor to be rechecked and to resume all of your other medications.

## 2020-12-22 NOTE — ED Provider Notes (Signed)
[facility] EMERGENCY DEPARTMENT Provider Note   CSN: [id number] Arrival date & time: 12/22/20  1840     History Chief Complaint  Patient presents with  . Chest Pain    Rhonda Waller is a 45 y.o. female.  HPI  HPI: A 45 year old patient with a history of treated diabetes and hypertension presents for evaluation of chest pain. Initial onset of pain was more than 6 hours ago. The patient's chest pain is sharp and is not worse with exertion. The patient's chest pain is middle- or left-sided, is not well-localized, is not described as heaviness/pressure/tightness and does radiate to the arms/jaw/neck. The patient does not complain of nausea and denies diaphoresis. The patient has no history of stroke, has no history of peripheral artery disease, has not smoked in the past 90 days, has no relevant family history of coronary artery disease (first degree relative at less than age 12), has no history of hypercholesterolemia and does not have an elevated BMI (>=30). CP and left arm. Pt has not been able to take any of her medications for a couple of months.  Past Medical History:  Diagnosis Date  . Diabetes mellitus without complication (HCC) 2012  . Pregnancy induced hypertension     Patient Active Problem List   Diagnosis Date Noted  . Pyelonephritis of right kidney 02/29/2020  . Sepsis (HCC) 02/29/2020  . Prolonged QT interval 02/29/2020  . Hyponatremia 02/29/2020  . Total bilirubin, elevated 02/29/2020  . GBS (group B streptococcus) UTI complicating pregnancy 06/19/2015  . Abortion, missed 06/17/2015  . Essential hypertension 06/17/2015  . Missed abortion 06/17/2015  . Abnormal liver function tests 05/07/2013  . Obesity 05/06/2013  . KNEE PAIN, BILATERAL 10/05/2010  . OBESITY 04/09/2010  . HYPERTENSION, BENIGN ESSENTIAL 04/09/2010  . Type 2 diabetes mellitus (HCC) 07/25/2009    Past Surgical History:  Procedure Laterality Date  . CHOLECYSTECTOMY        OB History    Gravida  7   Para  5   Term  5   Preterm  0   AB  1   Living  5     SAB  1   IAB  0   Ectopic  0   Multiple  0   Live Births  5           Family History  Problem Relation Age of Onset  . Diabetes Mother   . Diabetes Maternal Uncle     Social History   Tobacco Use  . Smoking status: Never Smoker  . Smokeless tobacco: Never Used  Substance Use Topics  . Alcohol use: No  . Drug use: No    Home Medications Prior to Admission medications   Medication Sig Start Date End Date Taking? Authorizing Provider  amLODipine (NORVASC) 10 MG tablet Take 1 tablet (10 mg total) by mouth daily. 12/22/20   Linwood Dibbles, MD  aspirin EC 325 MG tablet Take 325 mg by mouth daily as needed (pain/headache).    [provider]  atorvastatin (LIPITOR) 20 MG tablet Take 1 tablet (20 mg total) by mouth daily. 04/06/20   Hoy Register, MD  etonogestrel (NEXPLANON) 68 MG IMPL implant 68 mg by Subdermal route once.     [provider]  glipiZIDE (GLUCOTROL) 10 MG tablet Take 1 tablet (10 mg total) by mouth 2 (two) times daily before a meal. 09/07/20   Hoy Register, MD  insulin glargine (LANTUS) 100 UNIT/ML injection Inject 0.2 mLs (20 Units total)  into the skin daily. 03/05/20   Kathlen Mody, MD  levofloxacin (LEVAQUIN) 750 MG tablet Take 1 tablet (750 mg total) by mouth daily. 04/06/20   Hoy Register, MD  lisinopril (ZESTRIL) 40 MG tablet Take 1 tablet (40 mg total) by mouth daily. 12/22/20   Linwood Dibbles, MD  metFORMIN (GLUCOPHAGE) 500 MG tablet Take 2 tablets (1,000 mg total) by mouth 2 (two) times daily. 12/22/20   Linwood Dibbles, MD    Allergies    Patient has no known allergies.  Review of Systems   Review of Systems  All other systems reviewed and are negative.   Physical Exam Updated Vital Signs BP (!) 174/92   Pulse 75   Temp 100 F (37.8 C) (Oral)   Resp 18   SpO2 96%   Physical Exam Vitals and nursing note reviewed.   Constitutional:      General: She is not in acute distress.    Appearance: She is well-developed.  HENT:     Head: Normocephalic and atraumatic.     Right Ear: External ear normal.     Left Ear: External ear normal.  Eyes:     General: No scleral icterus.       Right eye: No discharge.        Left eye: No discharge.     Conjunctiva/sclera: Conjunctivae normal.  Neck:     Trachea: No tracheal deviation.  Cardiovascular:     Rate and Rhythm: Normal rate and regular rhythm.  Pulmonary:     Effort: Pulmonary effort is normal. No respiratory distress.     Breath sounds: Normal breath sounds. No stridor. No wheezing or rales.  Abdominal:     General: Bowel sounds are normal. There is no distension.     Palpations: Abdomen is soft.     Tenderness: There is no abdominal tenderness. There is no guarding or rebound.  Musculoskeletal:        General: No tenderness.     Cervical back: Neck supple.  Skin:    General: Skin is warm and dry.     Findings: No rash.  Neurological:     Mental Status: She is alert.     Cranial Nerves: No cranial nerve deficit (no facial droop, extraocular movements intact, no slurred speech).     Sensory: No sensory deficit.     Motor: No abnormal muscle tone or seizure activity.     Coordination: Coordination normal.     ED Results / Procedures / Treatments   Labs (all labs ordered are listed, but only abnormal results are displayed) Labs Reviewed  BASIC METABOLIC PANEL - Abnormal; Notable for the following components:      Result Value   Glucose, Bld 162 (*)    BUN 21 (*)    All other components within normal limits  URINALYSIS, ROUTINE W REFLEX MICROSCOPIC - Abnormal; Notable for the following components:   Color, Urine STRAW (*)    Specific Gravity, Urine 1.004 (*)    All other components within normal limits  CBC WITH DIFFERENTIAL/PLATELET  I-STAT BETA HCG BLOOD, ED ([facility], [facility], [facility] ONLY)  TROPONIN I (HIGH SENSITIVITY)  TROPONIN I (HIGH  SENSITIVITY)    EKG EKG Interpretation  Date/Time:  Tuesday Dec 22 2020 18:57:51 EDT Ventricular Rate:  81 PR Interval:  152 QRS Duration: 90 QT Interval:  328 QTC Calculation: 381 R Axis:   -25 Text Interpretation: Normal sinus rhythm Nonspecific T wave abnormality Abnormal ECG No significant change since last tracing  Confirmed by Linwood Dibbles [phone]) on 12/22/2020 8:37:45 PM   Radiology DG Chest 2 View  Result Date: 12/22/2020 CLINICAL DATA:  45 year old female with chest pain. EXAM: CHEST - 2 VIEW COMPARISON:  Chest radiograph dated 02/29/2020. FINDINGS: There is mild cardiomegaly with mild vascular congestion. No focal consolidation, pleural effusion, or pneumothorax. No acute osseous pathology. IMPRESSION: Cardiomegaly with mild vascular congestion. Electronically Signed   By: Elgie Collard M.D.   On: 12/22/2020 19:56    Procedures Procedures   Medications Ordered in ED Medications  nitroGLYCERIN (NITROGLYN) 2 % ointment 1 inch (1 inch Topical Given 12/22/20 2130)  amLODipine (NORVASC) tablet 10 mg (has no administration in time range)  lisinopril (ZESTRIL) tablet 40 mg (has no administration in time range)  metoprolol tartrate (LOPRESSOR) injection 5 mg (5 mg Intravenous Given 12/22/20 2136)  aspirin chewable tablet 324 mg (324 mg Oral Given 12/22/20 2129)    ED Course  I have reviewed the triage vital signs and the nursing notes.  Pertinent labs & imaging results that were available during my care of the patient were reviewed by me and considered in my medical decision making (see chart for details).  Clinical Course as of 12/22/20 2340  Tue Dec 22, 2020  2251 Urinalysis negative.  CBC normal.  Metabolic panel notable for blood sugar of 162 but no acidosis [JK]  2251 Troponins are normal [JK]  2251 Chest x-ray shows cardiomegaly with mild vascular congestion [JK]    Clinical Course User Index [JK] Linwood Dibbles, MD   MDM Rules/Calculators/A&P HEAR Score: 3                         Patient presented to the ED for evaluation of chest pain.  Patient does have history of hypertension but had not been taking her medications.  She does have cardiac risk factors.  Heart score is 3.  ED work-up is reassuring.  Serial troponins are normal.  Suspect patient symptoms may be related to her uncontrolled hypertension.  Will discharge home with prescriptions for her amlodipine and Zestril.  Patient's blood pressure did improve while she was here in the ED Final Clinical Impression(s) / ED Diagnoses Final diagnoses:  Chest pain, unspecified type  Hypertension, unspecified type    Rx / DC Orders ED Discharge Orders         Ordered    amLODipine (NORVASC) 10 MG tablet  Daily        12/22/20 2336    lisinopril (ZESTRIL) 40 MG tablet  Daily        12/22/20 2336    metFORMIN (GLUCOPHAGE) 500 MG tablet  2 times daily        12/22/20 2337           Linwood Dibbles, MD 12/22/20 2351

## 2020-12-22 NOTE — ED Provider Notes (Signed)
Emergency Medicine Provider Triage Evaluation Note  Rhonda Waller , a 45 y.o. female  was evaluated in triage.  Pt complains of left sided chest pain radiating into her arm and into her head for the past 2-3 days. The pain is exacerbated with deep inspiration. Found to be febrile in the ED at 100.0. Was unaware of fever. Denies recent sick contacts. No cough or SOB. Ran out of her blood pressure medications about 1 month ago. Pt is a never smoker. Daughter does not believe she is on any OCPs. Denies recent prolonged travel or immobilization.   Review of Systems  Positive: + chest pain, HA Negative: - SOB, cough  Physical Exam  BP (!) 230/115   Pulse 80   Temp 100 F (37.8 C) (Oral)   Resp 16   SpO2 100%  Gen:   Awake, no distress   Resp:  Normal effort  MSK:   Moves extremities without difficulty  Other:    Medical Decision Making  Medically screening exam initiated at 7:06 PM.  Appropriate orders placed.  Rhonda Waller was informed that the remainder of the evaluation will be completed by another provider, this initial triage assessment does not replace that evaluation, and the importance of remaining in the ED until their evaluation is complete.     Tanda Rockers, PA-C 12/22/20 1906    Mancel Bale, MD 12/22/20 [phone]

## 2020-12-22 NOTE — ED Triage Notes (Signed)
Pt is Spanish speaking. Pt reports left sided chest pain that radiates down her left arm for the past 3 days, reports pain is worse with inspiration. Hx of htn, pt ran out of her BP meds 1 month ago. Denies sob. Hx of diabetes.

## 2021-04-09 ENCOUNTER — Other Ambulatory Visit (HOSPITAL_COMMUNITY): Payer: Self-pay

## 2023-11-19 ENCOUNTER — Encounter (HOSPITAL_COMMUNITY): Payer: Self-pay

## 2023-11-19 ENCOUNTER — Ambulatory Visit (HOSPITAL_COMMUNITY)
Admission: EM | Admit: 2023-11-19 | Discharge: 2023-11-19 | Disposition: A | Discharge status: Home / Self Care | Payer: Self-pay | Attending: Emergency Medicine | Admitting: Emergency Medicine

## 2023-11-19 ENCOUNTER — Emergency Department (HOSPITAL_COMMUNITY): Payer: Self-pay

## 2023-11-19 ENCOUNTER — Emergency Department (HOSPITAL_COMMUNITY)
Admission: EM | Admit: 2023-11-19 | Discharge: 2023-11-19 | Disposition: A | Discharge status: Home / Self Care | Payer: Self-pay | Attending: Emergency Medicine | Admitting: Emergency Medicine

## 2023-11-19 ENCOUNTER — Other Ambulatory Visit: Payer: Self-pay

## 2023-11-19 DIAGNOSIS — Z79899 Other long term (current) drug therapy: Secondary | ICD-10-CM | POA: Insufficient documentation

## 2023-11-19 DIAGNOSIS — Z794 Long term (current) use of insulin: Secondary | ICD-10-CM | POA: Insufficient documentation

## 2023-11-19 DIAGNOSIS — R1084 Generalized abdominal pain: Secondary | ICD-10-CM

## 2023-11-19 DIAGNOSIS — Z7984 Long term (current) use of oral hypoglycemic drugs: Secondary | ICD-10-CM | POA: Insufficient documentation

## 2023-11-19 DIAGNOSIS — R1013 Epigastric pain: Secondary | ICD-10-CM

## 2023-11-19 DIAGNOSIS — I10 Essential (primary) hypertension: Secondary | ICD-10-CM | POA: Insufficient documentation

## 2023-11-19 DIAGNOSIS — E1165 Type 2 diabetes mellitus with hyperglycemia: Secondary | ICD-10-CM | POA: Insufficient documentation

## 2023-11-19 DIAGNOSIS — K2970 Gastritis, unspecified, without bleeding: Secondary | ICD-10-CM | POA: Insufficient documentation

## 2023-11-19 DIAGNOSIS — Z7982 Long term (current) use of aspirin: Secondary | ICD-10-CM | POA: Insufficient documentation

## 2023-11-19 LAB — URINALYSIS, ROUTINE W REFLEX MICROSCOPIC
Bacteria, UA: NONE SEEN
Bilirubin Urine: NEGATIVE
Glucose, UA: >=500 mg/dL — AB
Hgb urine dipstick: NEGATIVE
Ketones, ur: 5 mg/dL — AB
Leukocytes,Ua: NEGATIVE
Nitrite: NEGATIVE
Protein, ur: 100 mg/dL — AB
Specific Gravity, Urine: 1.018 (ref 1.005–1.030)
pH: 6 (ref 5.0–8.0)

## 2023-11-19 LAB — CBC WITH DIFFERENTIAL/PLATELET
Abs Immature Granulocytes: 0.03 10*3/uL (ref 0.00–0.07)
Basophils Absolute: 0.1 10*3/uL (ref 0.0–0.1)
Basophils Relative: 1 %
Eosinophils Absolute: 0.1 10*3/uL (ref 0.0–0.5)
Eosinophils Relative: 1 %
HCT: 40.9 % (ref 36.0–46.0)
Hemoglobin: 14 g/dL (ref 12.0–15.0)
Immature Granulocytes: 1 %
Lymphocytes Relative: 30 %
Lymphs Abs: 1.9 10*3/uL (ref 0.7–4.0)
MCH: 30.2 pg (ref 26.0–34.0)
MCHC: 34.2 g/dL (ref 30.0–36.0)
MCV: 88.1 fL (ref 80.0–100.0)
Monocytes Absolute: 0.3 10*3/uL (ref 0.1–1.0)
Monocytes Relative: 5 %
Neutro Abs: 4.1 10*3/uL (ref 1.7–7.7)
Neutrophils Relative %: 62 %
Platelets: 233 10*3/uL (ref 150–400)
RBC: 4.64 MIL/uL (ref 3.87–5.11)
RDW: 12.6 % (ref 11.5–15.5)
WBC: 6.5 10*3/uL (ref 4.0–10.5)
nRBC: 0 % (ref 0.0–0.2)

## 2023-11-19 LAB — COMPREHENSIVE METABOLIC PANEL WITH GFR
ALT: 27 U/L (ref 0–44)
AST: 23 U/L (ref 15–41)
Albumin: 3.6 g/dL (ref 3.5–5.0)
Alkaline Phosphatase: 132 U/L — ABNORMAL HIGH (ref 38–126)
Anion gap: 9 (ref 5–15)
BUN: 16 mg/dL (ref 6–20)
CO2: 24 mmol/L (ref 22–32)
Calcium: 8.6 mg/dL — ABNORMAL LOW (ref 8.9–10.3)
Chloride: 100 mmol/L (ref 98–111)
Creatinine, Ser: 0.76 mg/dL (ref 0.44–1.00)
GFR, Estimated: >60 mL/min (ref >60)
Glucose, Bld: 355 mg/dL — ABNORMAL HIGH (ref 70–99)
Potassium: 4 mmol/L (ref 3.5–5.1)
Sodium: 133 mmol/L — ABNORMAL LOW (ref 135–145)
Total Bilirubin: 1.1 mg/dL (ref 0.0–1.2)
Total Protein: 7.2 g/dL (ref 6.5–8.1)

## 2023-11-19 LAB — HCG, QUANTITATIVE, PREGNANCY: hCG, Beta Chain, Quant, S: 2 m[IU]/mL (ref <5)

## 2023-11-19 LAB — LIPASE, BLOOD: Lipase: 34 U/L (ref 11–51)

## 2023-11-19 LAB — MAGNESIUM: Magnesium: 1.9 mg/dL (ref 1.7–2.4)

## 2023-11-19 LAB — POCT FASTING CBG KUC MANUAL ENTRY: POCT Glucose (KUC): 326 mg/dL — AB (ref 70–99)

## 2023-11-19 LAB — TROPONIN I (HIGH SENSITIVITY)
Troponin I (High Sensitivity): 8 ng/L (ref <18)
Troponin I (High Sensitivity): 5 ng/L (ref <18)

## 2023-11-19 LAB — CBG MONITORING, ED: Glucose-Capillary: 326 mg/dL — ABNORMAL HIGH (ref 70–99)

## 2023-11-19 MED ORDER — ALUM & MAG HYDROXIDE-SIMETH 200-200-20 MG/5ML PO SUSP
30.0000 mL | Freq: Once | ORAL | Status: AC
  Administered 2023-11-19: 30 mL via ORAL
  Filled 2023-11-19: qty 30

## 2023-11-19 MED ORDER — IOHEXOL 350 MG/ML SOLN
75.0000 mL | Freq: Once | INTRAVENOUS | Status: AC | PRN
  Administered 2023-11-19: 75 mL via INTRAVENOUS

## 2023-11-19 MED ORDER — PANTOPRAZOLE SODIUM 40 MG IV SOLR
40.0000 mg | Freq: Once | INTRAVENOUS | Status: AC
  Administered 2023-11-19: 40 mg via INTRAVENOUS
  Filled 2023-11-19: qty 10

## 2023-11-19 MED ORDER — METFORMIN HCL 1000 MG PO TABS: 1000.0000 mg | ORAL_TABLET | Freq: Two times a day (BID) | ORAL | 2 refills | Status: AC

## 2023-11-19 MED ORDER — AMLODIPINE BESYLATE 5 MG PO TABS
10.0000 mg | ORAL_TABLET | Freq: Once | ORAL | Status: AC
  Administered 2023-11-19: 10 mg via ORAL
  Filled 2023-11-19: qty 2

## 2023-11-19 MED ORDER — ATORVASTATIN CALCIUM 10 MG PO TABS
20.0000 mg | ORAL_TABLET | Freq: Every day | ORAL | Status: DC
  Administered 2023-11-19: 20 mg via ORAL
  Filled 2023-11-19: qty 2

## 2023-11-19 MED ORDER — ACETAMINOPHEN 500 MG PO TABS
1000.0000 mg | ORAL_TABLET | Freq: Once | ORAL | Status: AC
  Administered 2023-11-19: 1000 mg via ORAL
  Filled 2023-11-19: qty 2

## 2023-11-19 MED ORDER — FAMOTIDINE 20 MG PO TABS: 20.0000 mg | ORAL_TABLET | Freq: Two times a day (BID) | ORAL | 0 refills | Status: AC

## 2023-11-19 MED ORDER — ONDANSETRON 4 MG PO TBDP: 4.0000 mg | ORAL_TABLET | Freq: Three times a day (TID) | ORAL | 0 refills | Status: AC | PRN

## 2023-11-19 MED ORDER — MORPHINE SULFATE (PF) 4 MG/ML IV SOLN
4.0000 mg | Freq: Once | INTRAVENOUS | Status: AC
  Administered 2023-11-19: 4 mg via INTRAVENOUS
  Filled 2023-11-19: qty 1

## 2023-11-19 MED ORDER — LISINOPRIL 20 MG PO TABS
40.0000 mg | ORAL_TABLET | Freq: Once | ORAL | Status: AC
  Administered 2023-11-19: 40 mg via ORAL
  Filled 2023-11-19: qty 2

## 2023-11-19 MED ORDER — METFORMIN HCL 500 MG PO TABS
1000.0000 mg | ORAL_TABLET | Freq: Once | ORAL | Status: AC
  Administered 2023-11-19: 1000 mg via ORAL
  Filled 2023-11-19: qty 2

## 2023-11-19 MED ORDER — ATORVASTATIN CALCIUM 20 MG PO TABS: 20.0000 mg | ORAL_TABLET | Freq: Every day | ORAL | 3 refills | Status: AC

## 2023-11-19 MED ORDER — AMLODIPINE BESYLATE 10 MG PO TABS: 10.0000 mg | ORAL_TABLET | Freq: Every day | ORAL | 2 refills | Status: AC

## 2023-11-19 MED ORDER — LISINOPRIL 40 MG PO TABS: 40.0000 mg | ORAL_TABLET | Freq: Every day | ORAL | 2 refills | Status: AC

## 2023-11-19 MED ORDER — ONDANSETRON HCL 4 MG/2ML IJ SOLN
4.0000 mg | Freq: Once | INTRAMUSCULAR | Status: AC
  Administered 2023-11-19: 4 mg via INTRAVENOUS
  Filled 2023-11-19: qty 2

## 2023-11-19 MED ORDER — GLIPIZIDE 10 MG PO TABS: 10.0000 mg | ORAL_TABLET | Freq: Two times a day (BID) | ORAL | 1 refills | Status: AC

## 2023-11-19 MED ORDER — LIDOCAINE VISCOUS HCL 2 % MT SOLN
15.0000 mL | Freq: Once | OROMUCOSAL | Status: AC
  Administered 2023-11-19: 15 mL via ORAL
  Filled 2023-11-19: qty 15

## 2023-11-19 NOTE — ED Triage Notes (Signed)
 Pt to the ed from home with a CC abd pain x 2 weeks. Pt denies n/v/d, fever chills, dizziness, urinary s/s, loc, cp.  Pt was seen at [facility] and was told to go to ed for abnormal EKG.

## 2023-11-19 NOTE — Care Management (Addendum)
 Transition of Care [facility]) - Emergency Department Mini Assessment   Patient Details  Name: Rhonda Waller MRN: [id number] Date of Birth: Mar 10, 1976  Transition of Care [facility]) CM/SW Contact:    Ronni Colace, RN Phone Number: 11/19/2023, 1:55 PM   Clinical Narrative:  Patient with hypertension and elevated sugar, c/o abdominal and chest pain.  Does not have a PCP or insurance.  PCP information placed on AVS. Will need follow up.   ED Mini Assessment: What brought you to the Emergency Department? : abdominal pain chest pain heartburn  Barriers to Discharge: Continued Medical Work up        Interventions which prevented an admission or readmission: PCP counseling    Patient Contact and Communications        ,                 Admission diagnosis:  right side pain/high bp Patient Active Problem List   Diagnosis Date Noted   Pyelonephritis of right kidney 02/29/2020   Sepsis (HCC) 02/29/2020   Prolonged QT interval 02/29/2020   Hyponatremia 02/29/2020   Total bilirubin, elevated 02/29/2020   GBS (group B streptococcus) UTI complicating pregnancy 06/19/2015   Abortion, missed 06/17/2015   Essential hypertension 06/17/2015   Missed abortion 06/17/2015   Abnormal liver function tests 05/07/2013   Obesity 05/06/2013   KNEE PAIN, BILATERAL 10/05/2010   OBESITY 04/09/2010   HYPERTENSION, BENIGN ESSENTIAL 04/09/2010   Type 2 diabetes mellitus (HCC) 07/25/2009   PCP:  Vicente Graham No Pharmacy:   [facility] [address] [place] - [address] [place] [address] Phone: [phone] Fax: [phone]

## 2023-11-19 NOTE — ED Provider Notes (Signed)
 [facility] Provider Note   CSN: [id number] Arrival date & time: 11/19/23  1109     History  Chief Complaint  Patient presents with   Abdominal Pain   Chest Pain    Rhonda Waller is a 48 y.o. female with PMH as listed below who presents with 9/10 right sided abdominal pain and epigastric pain for the past 2 weeks no relief with over-the-counter medications.  Patient reports that the whole right side of her abdomen hurts, now it is constant.  Patient reports she has also been having reflux which is not relieved by over-the-counter medications.  Patient has a past medical history of hypertension and diabetes she is currently not taking any medications x 7 months because she couldn't get an appointment w/ PCP. Denies f/c, nausea/vomiting, urinary sxs, vaginal sxs, diarrhea/constipation. Has h/o open cholecystectomy 18 years ago. Doesn't drink alcohol, smoke, or take NSAIDs.    Past Medical History:  Diagnosis Date   Diabetes mellitus without complication (HCC) 2012   Pregnancy induced hypertension        Home Medications Prior to Admission medications   Medication Sig Start Date End Date Taking? Authorizing Provider  famotidine  (PEPCID ) 20 MG tablet Take 1 tablet (20 mg total) by mouth 2 (two) times daily. 11/19/23  Yes Merdis Stalling, MD  ondansetron  (ZOFRAN -ODT) 4 MG disintegrating tablet Take 1 tablet (4 mg total) by mouth every 8 (eight) hours as needed for nausea or vomiting. 11/19/23  Yes Merdis Stalling, MD  amLODipine  (NORVASC ) 10 MG tablet Take 1 tablet (10 mg total) by mouth daily. 11/19/23   Merdis Stalling, MD  aspirin  EC 325 MG tablet Take 325 mg by mouth daily as needed (pain/headache).    [provider]  atorvastatin  (LIPITOR) 20 MG tablet Take 1 tablet (20 mg total) by mouth daily. 11/19/23   Merdis Stalling, MD  etonogestrel (NEXPLANON) 68 MG IMPL implant 68 mg by Subdermal route once.     [provider]  glipiZIDE  (GLUCOTROL ) 10 MG tablet Take 1 tablet (10 mg total) by mouth 2 (two) times daily before a meal. 11/19/23   Merdis Stalling, MD  insulin  glargine (LANTUS ) 100 UNIT/ML injection Inject 0.2 mLs (20 Units total) into the skin daily. 03/05/20   Akula, Vijaya, MD  lisinopril  (ZESTRIL ) 40 MG tablet Take 1 tablet (40 mg total) by mouth daily. 11/19/23   Merdis Stalling, MD  metFORMIN  (GLUCOPHAGE ) 1000 MG tablet Take 1 tablet (1,000 mg total) by mouth 2 (two) times daily. 11/19/23   Merdis Stalling, MD      Allergies    Patient has no known allergies.    Review of Systems   Review of Systems A 10 point review of systems was performed and is negative unless otherwise reported in HPI.  Physical Exam Updated Vital Signs BP (!) 188/96   Pulse 77   Temp 98.2 F (36.8 C) (Oral)   Resp 16   Ht 4\' 11"  (1.499 m)   Wt 83 kg   SpO2 95%   BMI 36.96 kg/m  Physical Exam General: Normal appearing female, lying in bed.  HEENT: PERRLA, Sclera anicteric, MMM, trachea midline.  Cardiology: RRR, no murmurs/rubs/gallops Resp: Normal respiratory rate and effort. CTAB, no wheezes, rhonchi, crackles.  Abd: Soft, RUQ and epigastric TTP. Some mild RLQ TTP. non-distended. No rebound tenderness or guarding.  GU: Deferred. MSK: No peripheral edema or signs of trauma. Extremities without deformity or  TTP. No cyanosis or clubbing. Skin: warm, dry. Back: +R CVA tenderness Neuro: A&Ox4, CNs II-XII grossly intact. MAEs. Sensation grossly intact.  Psych: Normal mood and affect.   ED Results / Procedures / Treatments   Labs (all labs ordered are listed, but only abnormal results are displayed) Labs Reviewed  COMPREHENSIVE METABOLIC PANEL WITH GFR - Abnormal; Notable for the following components:      Result Value   Sodium 133 (*)    Glucose, Bld 355 (*)    Calcium  8.6 (*)    Alkaline Phosphatase 132 (*)    All other components within normal limits  URINALYSIS, ROUTINE W REFLEX MICROSCOPIC  - Abnormal; Notable for the following components:   Color, Urine STRAW (*)    Glucose, UA >=500 (*)    Ketones, ur 5 (*)    Protein, ur 100 (*)    All other components within normal limits  CBG MONITORING, ED - Abnormal; Notable for the following components:   Glucose-Capillary 326 (*)    All other components within normal limits  CBC WITH DIFFERENTIAL/PLATELET  MAGNESIUM   LIPASE, BLOOD  HCG, QUANTITATIVE, PREGNANCY  TROPONIN I (HIGH SENSITIVITY)  TROPONIN I (HIGH SENSITIVITY)    EKG EKG Interpretation Date/Time:  Sunday November 19 2023 11:25:41 EDT Ventricular Rate:  74 PR Interval:  155 QRS Duration:  92 QT Interval:  399 QTC Calculation: 443 R Axis:   -14  Text Interpretation: Sinus rhythm Probable left atrial enlargement Abnormal R-wave progression, early transition LVH with secondary repolarization abnormality TWIs in I, aVL, mild in lateral leads Confirmed by Annita Kindle [phone]) on 11/19/2023 1:03:45 PM  Radiology CT abd pelvis w contrast: 1. No acute intra-abdominal or intrapelvic abnormality 2. Stool throughout the colon.  Procedures Procedures    Medications Ordered in ED Medications  lisinopril  (ZESTRIL ) tablet 40 mg (40 mg Oral Given 11/19/23 1215)  metFORMIN  (GLUCOPHAGE ) tablet 1,000 mg (1,000 mg Oral Given 11/19/23 1215)  amLODipine  (NORVASC ) tablet 10 mg (10 mg Oral Given 11/19/23 1216)  pantoprazole  (PROTONIX ) injection 40 mg (40 mg Intravenous Given 11/19/23 1216)  morphine  (PF) 4 MG/ML injection 4 mg (4 mg Intravenous Given 11/19/23 1216)  iohexol  (OMNIPAQUE ) 350 MG/ML injection 75 mL (75 mLs Intravenous Contrast Given 11/19/23 1516)  alum & mag hydroxide-simeth (MAALOX/MYLANTA) 200-200-20 MG/5ML suspension 30 mL (30 mLs Oral Given 11/19/23 1817)    And  lidocaine  (XYLOCAINE ) 2 % viscous mouth solution 15 mL (15 mLs Oral Given 11/19/23 1817)  acetaminophen  (TYLENOL ) tablet 1,000 mg (1,000 mg Oral Given 11/19/23 1844)  ondansetron  (ZOFRAN ) injection 4 mg (4 mg  Intravenous Given 11/19/23 1844)    ED Course/ Medical Decision Making/ A&P                          Medical Decision Making Amount and/or Complexity of Data Reviewed Labs: ordered. Decision-making details documented in ED Course. Radiology: ordered. Decision-making details documented in ED Course.  Risk OTC drugs. Prescription drug management.    This patient presents to the ED for concern of abdominal pain, nausea vomiting, this involves an extensive number of treatment options, and is a complaint that carries with it a high risk of complications and morbidity.  I considered the following differential and admission for this acute, potentially life threatening condition.   MDM:    For DDX for abdominal pain includes but is not limited to:  Abdominal exam without peritoneal signs. No evidence of acute abdomen at this time. Low suspicion  for acute hepatobiliary disease (including acute cholecystitis or cholangitis) given remote cholecystectomy. Consider acute pancreatitis (neg lipase), PUD (including gastric perforation). Consider appendicitis as well but most pain appears to be in upper right/epigastric region. Lower c/f vascular catastrophe, bowel obstruction, viscus perforation, or diverticulitis.    She has no findings on her CT abdomen pelvis.  She unfortunately has some vomiting after the GI cocktail but the IV Protonix  did help her.  I suspect likely PUD/gastritis are causing her symptoms.  She tolerated p.o. in the department.  She does not drink any alcohol take NSAIDs or smoke.  Will prescribe Pepcid  20 mg twice per day and Zofran  ODT as needed for her symptoms.  Instructed her to stay well-hydrated.   Patient is overall well appearing at this time. Blood pressure is 223/107 mmHg. Hasn't had meds including amlodipine  10 and lisinopril  40 x 7 months.  She is also hyperglycemic but does not have any indication of DKA/HHS.  Patient does not have any symptoms on history nor signs on  physical exam concerning for end organ damage secondary to hypertension.   Specifically, based up the patient's presentation, the patient is at sufficiently low risk for: -ACS given no CP, no SOB, normal cardio-pulmonary exam -SAH/stroke given no hx of acute headache/stroke like sxs and normal neurologic exam.  -end organ renal disease given no hematuria  EKG shows TWIs in leads I, aVL, and mild in lateral leads. Reassuringly her troponin is negative. Patient is given amlodipine /lisinopril  for their blood pressure, with improvement. All of her medicines are refilled and I discussed with the patient the deleterious effects of HTN/hyperglycemia and need to establish with PCP. Instructed to f/u within 1 week.  Gave patient instructions on how to establish with a PCP.    Clinical Course as of 11/23/23 2027  Sun Nov 19, 2023  1300 CBC with Differential wnl [HN]  1300 Glucose-Capillary(!): 326 Hyperglycemia [HN]  1320 BP(!): 198/78 Improving BP [HN]  1455 Troponin I (High Sensitivity): 5 Neg x2 [HN]  1801 CT ABDOMEN PELVIS W CONTRAST 1. No acute intra-abdominal or intrapelvic abnormality 2. Stool throughout the colon.   [HN]  [id number] Patient had some vomiting after CT and the GI cocktail didn't help. She was helped earlier by the IV protonix . Will give antiemetic. [HN]    Clinical Course User Index [HN] Merdis Stalling, MD    Labs: I Ordered, and personally interpreted labs.  The pertinent results include:  those litsed above  Imaging Studies ordered: I ordered imaging studies including CT abd pelvis I independently visualized and interpreted imaging. I agree with the radiologist interpretation  Additional history obtained from chart review.    Cardiac Monitoring: The patient was maintained on a cardiac monitor.  I personally viewed and interpreted the cardiac monitored which showed an underlying rhythm of: NSR  Reevaluation: After the interventions noted above, I reevaluated the  patient and found that they have :improved  Social Determinants of Health: Lives independently  Disposition:  DC w/ discharge instructions/return precautions. All questions answered to patient's satisfaction.    Co morbidities that complicate the patient evaluation  Past Medical History:  Diagnosis Date   Diabetes mellitus without complication (HCC) 2012   Pregnancy induced hypertension      Medicines Meds ordered this encounter  Medications   lisinopril  (ZESTRIL ) tablet 40 mg   metFORMIN  (GLUCOPHAGE ) tablet 1,000 mg   amLODipine  (NORVASC ) tablet 10 mg   DISCONTD: atorvastatin  (LIPITOR) tablet 20 mg   pantoprazole  (PROTONIX ) injection 40  mg   morphine  (PF) 4 MG/ML injection 4 mg   amLODipine  (NORVASC ) 10 MG tablet    Sig: Take 1 tablet (10 mg total) by mouth daily.    Dispense:  30 tablet    Refill:  2   atorvastatin  (LIPITOR) 20 MG tablet    Sig: Take 1 tablet (20 mg total) by mouth daily.    Dispense:  30 tablet    Refill:  3   glipiZIDE  (GLUCOTROL ) 10 MG tablet    Sig: Take 1 tablet (10 mg total) by mouth 2 (two) times daily before a meal.    Dispense:  60 tablet    Refill:  1    Dose increase   metFORMIN  (GLUCOPHAGE ) 1000 MG tablet    Sig: Take 1 tablet (1,000 mg total) by mouth 2 (two) times daily.    Dispense:  180 tablet    Refill:  2   lisinopril  (ZESTRIL ) 40 MG tablet    Sig: Take 1 tablet (40 mg total) by mouth daily.    Dispense:  30 tablet    Refill:  2   iohexol  (OMNIPAQUE ) 350 MG/ML injection 75 mL   AND Linked Order Group    alum & mag hydroxide-simeth (MAALOX/MYLANTA) 200-200-20 MG/5ML suspension 30 mL    lidocaine  (XYLOCAINE ) 2 % viscous mouth solution 15 mL   acetaminophen  (TYLENOL ) tablet 1,000 mg   ondansetron  (ZOFRAN ) injection 4 mg   ondansetron  (ZOFRAN -ODT) 4 MG disintegrating tablet    Sig: Take 1 tablet (4 mg total) by mouth every 8 (eight) hours as needed for nausea or vomiting.    Dispense:  20 tablet    Refill:  0   famotidine   (PEPCID ) 20 MG tablet    Sig: Take 1 tablet (20 mg total) by mouth 2 (two) times daily.    Dispense:  30 tablet    Refill:  0    I have reviewed the patients home medicines and have made adjustments as needed  Problem List / ED Course: Problem List Items Addressed This Visit       Cardiovascular and Mediastinum   Essential hypertension   Relevant Medications   amLODipine  (NORVASC ) 10 MG tablet   atorvastatin  (LIPITOR) 20 MG tablet   lisinopril  (ZESTRIL ) 40 MG tablet     Endocrine   Type 2 diabetes mellitus (HCC)   Relevant Medications   atorvastatin  (LIPITOR) 20 MG tablet   glipiZIDE  (GLUCOTROL ) 10 MG tablet   metFORMIN  (GLUCOPHAGE ) 1000 MG tablet   lisinopril  (ZESTRIL ) 40 MG tablet   Other Visit Diagnoses       Asymptomatic hypertension    -  Primary   Relevant Medications   lisinopril  (ZESTRIL ) tablet 40 mg (Completed)   amLODipine  (NORVASC ) tablet 10 mg (Completed)   amLODipine  (NORVASC ) 10 MG tablet   atorvastatin  (LIPITOR) 20 MG tablet   lisinopril  (ZESTRIL ) 40 MG tablet     Epigastric pain         Gastritis without bleeding, unspecified chronicity, unspecified gastritis type                       This note was created using dictation software, which may contain spelling or grammatical errors.    Merdis Stalling, MD 11/23/23 2030

## 2023-11-19 NOTE — Discharge Instructions (Addendum)
 Thank you for coming to [facility] Emergency Department. You were seen for abdominal pain, nausea and vomiting. We did an exam, labs, and imaging, and these showed no acute findings.  Likely you are having reflux or gastritis.  Please take Pepcid 20 mg twice per day.  You can also take the Zofran  under the tongue 4 mg every 6-8 hours as needed for nausea and vomiting.  Please stay well-hydrated.   Please avoid smoking, alcohol, NSAIDs such as ibuprofen  or aspirin , or spicy or fatty foods.  Please also avoid coffee or chocolate.  Please avoid eating late at night.  These things can help your symptoms.  Please follow up with your primary care provider within 1 week.  If you need to find a new primary care provider, you can go on to [employer] and filtered by first available.  There are appointments available this week.  You also had significant high blood pressure.  We represcribed your medications.  Please take them as prescribed.  Do not hesitate to return to the ED or call 911 if you experience: -Worsening symptoms -Chest pain, shortness of breath -Nausea/vomiting so severe that you cannot eat/drink anything -Lightheadedness, passing out -Fevers/chills -Anything else that concerns you  It was a pleasure to participate in your care today.   Gracias por venir al [facility] [place]. Fue atendido por dolor abdominal, nuseas y vmitos. Le realizamos un examen, anlisis de laboratorio e imgenes, y no se detectaron signos agudos. Es probable que tenga reflujo o gastritis. Tome Pepcid 20 mg [employer]. Tambin puede tomar Zofran  sublingual 4 mg cada 6-8 horas segn sea necesario para las nuseas y los vmitos. Mantngase bien hidratado.  Evite fumar, el alcohol, los AINE como el ibuprofeno o la aspirina, y las comidas picantes o Art therapist. Tambin evite el caf y el chocolate. Evite comer tarde por la noche. Estas medidas pueden aliviar sus sntomas.  Consulte  con su mdico de cabecera dentro de una semana. Si necesita encontrar un nuevo mdico de cabecera, puede visitar el sitio web de [facility] [employer] por la primera disponibilidad. Hay citas disponibles esta semana.  Tambin tena presin arterial alta significativa. Le recetamos nuevamente sus medicamentos. Tmelos segn lo prescrito.  No dude en volver a urgencias o llamar al 911 si experimenta: - Empeoramiento de los sntomas - Dolor en el pecho, dificultad para respirar - Nuseas/vmitos tan intensos que no puede comer ni beber nada - Mareos, [employer] - Fiebre/escalofros - Cualquier otra cosa que le preocupe  Fue un placer haber participado en su atencin hoy.

## 2023-11-19 NOTE — ED Provider Notes (Signed)
 [facility]    CSN: [id number] Arrival date & time: 11/19/23  1021      History   Chief Complaint Chief Complaint  Patient presents with   Abdominal Pain    reflux   Gastroesophageal Reflux    HPI Rhonda Waller is a 48 y.o. female.   Patient complains of right sided chest pain and reflux for the past 2 weeks no relief with over-the-counter medications.  Patient reports that the whole right side of her abdomen hurts.  Patient reports she has also been having reflux which is not relieved by over-the-counter medications.  Patient has a past medical history of hypertension and diabetes she is currently not taking any medications.  The history is provided by the patient. No language interpreter was used.  Abdominal Pain Gastroesophageal Reflux Associated symptoms include abdominal pain.    Past Medical History:  Diagnosis Date   Diabetes mellitus without complication (HCC) 2012   Pregnancy induced hypertension     Patient Active Problem List   Diagnosis Date Noted   Pyelonephritis of right kidney 02/29/2020   Sepsis (HCC) 02/29/2020   Prolonged QT interval 02/29/2020   Hyponatremia 02/29/2020   Total bilirubin, elevated 02/29/2020   GBS (group B streptococcus) UTI complicating pregnancy 06/19/2015   Abortion, missed 06/17/2015   Essential hypertension 06/17/2015   Missed abortion 06/17/2015   Abnormal liver function tests 05/07/2013   Obesity 05/06/2013   KNEE PAIN, BILATERAL 10/05/2010   OBESITY 04/09/2010   HYPERTENSION, BENIGN ESSENTIAL 04/09/2010   Type 2 diabetes mellitus (HCC) 07/25/2009    Past Surgical History:  Procedure Laterality Date   CHOLECYSTECTOMY      OB History     Gravida  7   Para  5   Term  5   Preterm  0   AB  1   Living  5      SAB  1   IAB  0   Ectopic  0   Multiple  0   Live Births  5            Home Medications    Prior to Admission medications   Medication Sig Start Date End Date  Taking? Authorizing Provider  etonogestrel (NEXPLANON) 68 MG IMPL implant 68 mg by Subdermal route once.    Yes [provider]  amLODipine  (NORVASC ) 10 MG tablet Take 1 tablet (10 mg total) by mouth daily. 12/22/20   Trish Furl, MD  aspirin  EC 325 MG tablet Take 325 mg by mouth daily as needed (pain/headache).    [provider]  atorvastatin  (LIPITOR) 20 MG tablet Take 1 tablet (20 mg total) by mouth daily. 04/06/20   Newlin, Enobong, MD  glipiZIDE  (GLUCOTROL ) 10 MG tablet Take 1 tablet (10 mg total) by mouth 2 (two) times daily before a meal. 09/07/20   Newlin, Enobong, MD  insulin  glargine (LANTUS ) 100 UNIT/ML injection Inject 0.2 mLs (20 Units total) into the skin daily. 03/05/20   Akula, Vijaya, MD  levofloxacin  (LEVAQUIN ) 750 MG tablet Take 1 tablet (750 mg total) by mouth daily. 04/06/20   Newlin, Enobong, MD  lisinopril  (ZESTRIL ) 40 MG tablet Take 1 tablet (40 mg total) by mouth daily. 12/22/20   Trish Furl, MD  metFORMIN  (GLUCOPHAGE ) 500 MG tablet Take 2 tablets (1,000 mg total) by mouth 2 (two) times daily. 12/22/20   Trish Furl, MD    Family History Family History  Problem Relation Age of Onset   Diabetes Mother    Diabetes  Maternal Uncle     Social History Social History   Tobacco Use   Smoking status: Never   Smokeless tobacco: Never  Substance Use Topics   Alcohol use: No   Drug use: No     Allergies   Patient has no known allergies.   Review of Systems Review of Systems  Gastrointestinal:  Positive for abdominal pain.  All other systems reviewed and are negative.    Physical Exam Triage Vital Signs ED Triage Vitals  Encounter Vitals Group     BP 11/19/23 1038 (!) 224/111     Systolic BP Percentile --      Diastolic BP Percentile --      Pulse Rate 11/19/23 1038 85     Resp 11/19/23 1038 18     Temp 11/19/23 1038 98.3 F (36.8 C)     Temp Source 11/19/23 1039 Oral     SpO2 11/19/23 1038 98 %     Weight --      Height --      Head  Circumference --      Peak Flow --      Pain Score 11/19/23 1040 9     Pain Loc --      Pain Education --      Exclude from Growth Chart --    No data found.  Updated Vital Signs BP (!) 224/111 (BP Location: Right Arm)   Pulse 82   Temp 98.3 F (36.8 C) (Oral)   Resp 18   SpO2 96%   Visual Acuity Right Eye Distance:   Left Eye Distance:   Bilateral Distance:    Right Eye Near:   Left Eye Near:    Bilateral Near:     Physical Exam Vitals reviewed.  Constitutional:      Appearance: She is well-developed.  HENT:     Head: Normocephalic.  Cardiovascular:     Rate and Rhythm: Normal rate and regular rhythm.  Abdominal:     General: Abdomen is protuberant. Bowel sounds are normal.     Palpations: Abdomen is soft.     Tenderness: There is abdominal tenderness in the right upper quadrant.     Comments: Tender right abdomen right upper right lower quadrant.  Patient also points to the right side of her chest as area of pain.  Neurological:     Mental Status: She is alert.      UC Treatments / Results  Labs (all labs ordered are listed, but only abnormal results are displayed) Labs Reviewed  POCT FASTING CBG [facility] MANUAL ENTRY - Abnormal; Notable for the following components:      Result Value   POCT Glucose ([facility]) 326 (*)    All other components within normal limits    EKG   Radiology No results found.  Procedures Procedures (including critical care time)  Medications Ordered in UC Medications - No data to display  Initial Impression / Assessment and Plan / UC Course  I have reviewed the triage vital signs and the nursing notes.  Pertinent labs & imaging results that were available during my care of the patient were reviewed by me and considered in my medical decision making (see chart for details).     Patient is hypertensive to 224/111.  EKG is obtained and shows normal sinus rhythm nonspecific T wave abnormality.  Patient advised to go to the emergency  department she needs management for her blood pressure as well as assessment for her abdominal pain.  Patient agrees.  Patient transferred to the emergency department  Final diagnoses:  Generalized abdominal pain   Discharge Instructions   None    ED Prescriptions   None    PDMP not reviewed this encounter.   Sandi Crosby, [place] 11/19/23 1102

## 2023-11-19 NOTE — ED Triage Notes (Signed)
 Pt states that her entire right side of her abd has been hurting for two weeks, and has had heartburn since Monday last week. Pt states that she has taken the acid reflux medication Galvicon with some relief. Pt states that her pain is a 9/10 currently.

## 2023-11-19 NOTE — ED Notes (Signed)
 PT instructed to go to ED because of high BP .High blood sugar.
